# Patient Record
Sex: Male | Born: 1935
Health system: Southern US, Community
[De-identification: ages and names within clinical notes are randomized; demographics above are authoritative.]

## PROBLEM LIST (undated history)

## (undated) DIAGNOSIS — I10 Essential (primary) hypertension: Secondary | ICD-10-CM

## (undated) DIAGNOSIS — M869 Osteomyelitis, unspecified: Secondary | ICD-10-CM

## (undated) DIAGNOSIS — N179 Acute kidney failure, unspecified: Secondary | ICD-10-CM

## (undated) DIAGNOSIS — E119 Type 2 diabetes mellitus without complications: Secondary | ICD-10-CM

## (undated) DIAGNOSIS — E785 Hyperlipidemia, unspecified: Secondary | ICD-10-CM

## (undated) HISTORY — DX: Hyperlipidemia, unspecified: E78.5

## (undated) HISTORY — PX: HERNIA REPAIR: SHX51

## (undated) HISTORY — PX: TOE AMPUTATION: SHX809

## (undated) HISTORY — PX: APPENDECTOMY: SHX54

---
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
## 1959-04-07 HISTORY — PX: BRAIN SURGERY: SHX531

## 2008-05-16 ENCOUNTER — Ambulatory Visit: Payer: Self-pay | Admitting: Ophthalmology

## 2008-05-29 ENCOUNTER — Ambulatory Visit: Payer: Self-pay | Admitting: Ophthalmology

## 2008-07-02 LAB — HM HEPATITIS C SCREENING LAB: HM HEPATITIS C SCREENING: NEGATIVE

## 2010-06-02 ENCOUNTER — Ambulatory Visit: Payer: Self-pay | Admitting: Ophthalmology

## 2011-07-07 ENCOUNTER — Other Ambulatory Visit: Payer: Self-pay | Admitting: Ophthalmology

## 2011-07-07 DIAGNOSIS — H47019 Ischemic optic neuropathy, unspecified eye: Secondary | ICD-10-CM

## 2011-07-09 ENCOUNTER — Ambulatory Visit
Admission: RE | Admit: 2011-07-09 | Discharge: 2011-07-09 | Disposition: A | Discharge status: Home / Self Care | Payer: PRIVATE HEALTH INSURANCE | Source: Ambulatory Visit | Attending: Ophthalmology | Admitting: Ophthalmology

## 2011-07-09 DIAGNOSIS — H47019 Ischemic optic neuropathy, unspecified eye: Secondary | ICD-10-CM

## 2011-07-09 MED ORDER — GADOBENATE DIMEGLUMINE 529 MG/ML IV SOLN
20.0000 mL | Freq: Once | INTRAVENOUS | Status: AC | PRN
  Administered 2011-07-09: 20 mL via INTRAVENOUS

## 2012-03-09 ENCOUNTER — Other Ambulatory Visit: Payer: Self-pay | Admitting: Neurosurgery

## 2012-03-09 DIAGNOSIS — D329 Benign neoplasm of meninges, unspecified: Secondary | ICD-10-CM

## 2012-03-21 ENCOUNTER — Ambulatory Visit
Admission: RE | Admit: 2012-03-21 | Discharge: 2012-03-21 | Disposition: A | Discharge status: Home / Self Care | Payer: PRIVATE HEALTH INSURANCE | Source: Ambulatory Visit | Attending: Neurosurgery | Admitting: Neurosurgery

## 2012-03-21 DIAGNOSIS — D329 Benign neoplasm of meninges, unspecified: Secondary | ICD-10-CM

## 2013-11-21 ENCOUNTER — Encounter: Payer: Self-pay | Admitting: Podiatry

## 2013-11-21 ENCOUNTER — Ambulatory Visit (INDEPENDENT_AMBULATORY_CARE_PROVIDER_SITE_OTHER): Payer: Medicare Other | Admitting: Podiatry

## 2013-11-21 VITALS — BP 188/102 | HR 90 | Resp 16 | Ht 71.0 in | Wt 209.0 lb

## 2013-11-21 DIAGNOSIS — M79609 Pain in unspecified limb: Secondary | ICD-10-CM

## 2013-11-21 DIAGNOSIS — M79673 Pain in unspecified foot: Secondary | ICD-10-CM

## 2013-11-21 DIAGNOSIS — B351 Tinea unguium: Secondary | ICD-10-CM

## 2013-11-21 NOTE — Progress Notes (Signed)
   Subjective:    Patient ID: Lucas Jackson, male    DOB: 10/25/1927, 80 y.o.   MRN: [id number]  HPI Comments: i need my toenails trimmed. They do not hurt. They are not getting any worse. My nails have been this way for yrs. i trim my toenails.      Review of Systems     Objective:   Physical Exam        Assessment & Plan:

## 2013-11-22 NOTE — Progress Notes (Signed)
Subjective:     Patient ID: Lucas Jackson, male   DOB: [birth year], 80 y.o.   MRN: [id number]  HPI patient presents stating I cannot cut my nails anymore they're thick and they can become sore at times and the corner if they get too long   Review of Systems  All other systems reviewed and are negative.      Objective:   Physical Exam  Nursing note and vitals reviewed. Constitutional: He is oriented to person, place, and time.  Cardiovascular: Intact distal pulses.   Musculoskeletal: Normal range of motion.  Neurological: He is oriented to person, place, and time.  Skin: Skin is warm and dry.   neurovascular status found to be intact with muscle strength adequate and range of motion subtalar midtarsal joint within normal limits. Patient is found to have diminished MPJ motion and nail disease with thickness of the underlying nailbed and pain with pressure upon corner evaluation and is noted to have mild structural digital deformities     Assessment:     Nail disease with mycosis and digital deformities of both feet    Plan:     H&P performed and debridement accomplished with advice on wider shoes and soft leather-type materials

## 2014-02-20 ENCOUNTER — Ambulatory Visit: Payer: Medicare Other

## 2014-06-28 ENCOUNTER — Ambulatory Visit (INDEPENDENT_AMBULATORY_CARE_PROVIDER_SITE_OTHER): Payer: Medicare Other

## 2014-06-28 ENCOUNTER — Ambulatory Visit (INDEPENDENT_AMBULATORY_CARE_PROVIDER_SITE_OTHER): Payer: Medicare Other | Admitting: Podiatrist

## 2014-06-28 ENCOUNTER — Encounter: Payer: Self-pay | Admitting: Podiatrist

## 2014-06-28 VITALS — BP 162/78 | HR 73

## 2014-06-28 DIAGNOSIS — L97514 Non-pressure chronic ulcer of other part of right foot with necrosis of bone: Secondary | ICD-10-CM

## 2014-06-28 DIAGNOSIS — M79609 Pain in unspecified limb: Principal | ICD-10-CM

## 2014-06-28 DIAGNOSIS — B351 Tinea unguium: Secondary | ICD-10-CM | POA: Diagnosis not present

## 2014-06-28 MED ORDER — HYDROCODONE-ACETAMINOPHEN 5-325 MG PO TABS: 1.0000 | ORAL_TABLET | ORAL | Status: DC | PRN

## 2014-06-28 MED ORDER — CIPROFLOXACIN HCL 500 MG PO TABS: 500.0000 mg | ORAL_TABLET | Freq: Two times a day (BID) | ORAL | Status: DC

## 2014-06-28 MED ORDER — SILVER SULFADIAZINE 1 % EX CREA: 1.0000 "application " | TOPICAL_CREAM | Freq: Every day | CUTANEOUS | Status: DC

## 2014-06-28 MED ORDER — AMOXICILLIN-POT CLAVULANATE 875-125 MG PO TABS: 1.0000 | ORAL_TABLET | Freq: Two times a day (BID) | ORAL | Status: DC

## 2014-06-28 NOTE — Patient Instructions (Addendum)
  Instructions for Wound Care  The most important step to healing a foot wound is to reduce the pressure on your foot - it is extremely important to stay off your foot as much as possible and wear the shoe/boot as instructed.  Cleanse your foot with saline wash or warm soapy water (dial antibacterial soap or similar).  Blot dry.  Apply prescribed medication to your wound and cover with gauze and a bandage.  May hold bandage in place with Coban (self sticky wrap), Ace bandage or tape.  You may find dressing supplies at your local [employer], [employer], drug store or medical supply store.  Your prescribed topical medication is :  Silvadene Cream (twice daily)    If you notice any foul odor, increase in pain, pus, increased swelling, red streaks or generalized redness occurring in your foot or leg-Call our office immediately to be seen.  This may be a sign of a limb or life threatening infection that will need prompt attention.  Trudie Buckler, [place]  [phone] [place] [facility]

## 2014-06-28 NOTE — Progress Notes (Signed)
   Subjective:    Patient ID: Lucas Jackson, male    DOB: 1927-11-17, 81 y.o.   MRN: [id number]  HPI  Right great toenail, pain and swelling , nail may be coming off. Patient is diabetic, relates no trauma or injury to the toe.  States the right great toe started to swell recently.   Review of Systems  All other systems reviewed and are negative.      Objective:   Physical Exam Vascular status intact with pedal pulse palpable dp at 1/4 and pt pulse at 1/4 right.  Left foot has pulses intact.  Peripheral neuropathy is noted bilateral.  No sensation present to the plantar tip of the right great toe.  Right hallux has ulceration plantar distal tip of the hallux. + probe to bone.  No pus or drainage expressed.  Necrotic, hyperkeratotic tissue peri ulceration is noted.  No malodor noted.  Right hallux is mildly loose and is debrided back.  Moderate swelling to the right foot is noted.   xrays taken of the right hallux-  Reveal osteolysis at the distal tuft of the hallux.  + for osteomyelitis.     Assessment & Plan:  Ulceration with osteomyelitis tip of right hallux  Plan:  We are going to try a course of antibiotic to see if it will improve the foot and toe-- discussed that osteomyelitis often requires debridement of infected bone.  Will start him on cipro and augmentin and he will be seen back next week.  Also wrote him for some hydrocodone for pain.  He is a [place] patient and requests to be seen in the [place] office in the future.  An appointment for next week has been set up for him. If any increased redness, swelling, pain or drainage are to occur he is instructed to call or go to the er.

## 2014-07-03 ENCOUNTER — Inpatient Hospital Stay
Admit: 2014-07-03 | Disposition: A | Discharge status: Skilled Nursing Facility | Payer: Self-pay | Attending: Internal Medicine | Admitting: Internal Medicine

## 2014-07-03 ENCOUNTER — Ambulatory Visit (INDEPENDENT_AMBULATORY_CARE_PROVIDER_SITE_OTHER): Payer: Medicare Other | Admitting: Podiatry

## 2014-07-03 VITALS — Temp 99.1°F

## 2014-07-03 DIAGNOSIS — L03119 Cellulitis of unspecified part of limb: Secondary | ICD-10-CM

## 2014-07-03 DIAGNOSIS — L97514 Non-pressure chronic ulcer of other part of right foot with necrosis of bone: Secondary | ICD-10-CM | POA: Diagnosis not present

## 2014-07-03 LAB — CBC WITH DIFFERENTIAL/PLATELET
BASOS ABS: 0.1 10*3/uL (ref 0.0–0.1)
BASOS PCT: 0.8 %
EOS ABS: 0.1 10*3/uL (ref 0.0–0.7)
Eosinophil %: 1 %
HCT: 35 % — AB (ref 40.0–52.0)
HGB: 11.1 g/dL — AB (ref 13.0–18.0)
LYMPHS PCT: 18.3 %
Lymphocyte #: 1.3 10*3/uL (ref 1.0–3.6)
MCH: 23.4 pg — ABNORMAL LOW (ref 26.0–34.0)
MCHC: 31.7 g/dL — ABNORMAL LOW (ref 32.0–36.0)
MCV: 74 fL — ABNORMAL LOW (ref 80–100)
MONOS PCT: 6 %
Monocyte #: 0.4 x10 3/mm (ref 0.2–1.0)
NEUTROS ABS: 5.3 10*3/uL (ref 1.4–6.5)
Neutrophil %: 73.9 %
Platelet: 349 10*3/uL (ref 150–440)
RBC: 4.74 10*6/uL (ref 4.40–5.90)
RDW: 14.6 % — ABNORMAL HIGH (ref 11.5–14.5)
WBC: 7.2 10*3/uL (ref 3.8–10.6)

## 2014-07-03 LAB — COMPREHENSIVE METABOLIC PANEL
ALK PHOS: 66 U/L
ALT: 24 U/L
Albumin: 3.3 g/dL — ABNORMAL LOW
Anion Gap: 9 (ref 7–16)
BILIRUBIN TOTAL: 0.5 mg/dL
BUN: 17 mg/dL
CALCIUM: 8.9 mg/dL
Chloride: 101 mmol/L
Co2: 27 mmol/L
Creatinine: 1.11 mg/dL
EGFR (African American): >60
EGFR (Non-African Amer.): >60
Glucose: 101 mg/dL — ABNORMAL HIGH
POTASSIUM: 4.2 mmol/L
SGOT(AST): 37 U/L
Sodium: 137 mmol/L
Total Protein: 7.5 g/dL

## 2014-07-03 LAB — LACTIC ACID, PLASMA: Lactic Acid, Venous: 1.2 mmol/L

## 2014-07-03 NOTE — Patient Instructions (Signed)
Due to the worsening infection in your right big toe, I recommended you go directly to the emergency room.

## 2014-07-04 LAB — COMPREHENSIVE METABOLIC PANEL
ALT: 20 U/L
ANION GAP: 7 (ref 7–16)
Albumin: 2.7 g/dL — ABNORMAL LOW
Alkaline Phosphatase: 55 U/L
BUN: 17 mg/dL
Bilirubin,Total: 0.5 mg/dL
CALCIUM: 8.4 mg/dL — AB
CHLORIDE: 102 mmol/L
Co2: 28 mmol/L
Creatinine: 1.1 mg/dL
EGFR (Non-African Amer.): >60
Glucose: 124 mg/dL — ABNORMAL HIGH
Potassium: 3.3 mmol/L — ABNORMAL LOW
SGOT(AST): 28 U/L
Sodium: 137 mmol/L
Total Protein: 6.4 g/dL — ABNORMAL LOW

## 2014-07-04 LAB — CBC WITH DIFFERENTIAL/PLATELET
Basophil #: 0 10*3/uL (ref 0.0–0.1)
Basophil %: 0.4 %
EOS ABS: 0.1 10*3/uL (ref 0.0–0.7)
Eosinophil %: 1.6 %
HCT: 31 % — ABNORMAL LOW (ref 40.0–52.0)
HGB: 10 g/dL — ABNORMAL LOW (ref 13.0–18.0)
LYMPHS PCT: 27.6 %
Lymphocyte #: 1.3 10*3/uL (ref 1.0–3.6)
MCH: 23.8 pg — AB (ref 26.0–34.0)
MCHC: 32.2 g/dL (ref 32.0–36.0)
MCV: 74 fL — AB (ref 80–100)
MONOS PCT: 8.5 %
Monocyte #: 0.4 x10 3/mm (ref 0.2–1.0)
NEUTROS PCT: 61.9 %
Neutrophil #: 3 10*3/uL (ref 1.4–6.5)
Platelet: 280 10*3/uL (ref 150–440)
RBC: 4.19 10*6/uL — AB (ref 4.40–5.90)
RDW: 14.5 % (ref 11.5–14.5)
WBC: 4.9 10*3/uL (ref 3.8–10.6)

## 2014-07-05 LAB — CBC WITH DIFFERENTIAL/PLATELET
BASOS PCT: 0.4 %
Basophil #: 0 10*3/uL (ref 0.0–0.1)
EOS ABS: 0.1 10*3/uL (ref 0.0–0.7)
Eosinophil %: 2.6 %
HCT: 33 % — AB (ref 40.0–52.0)
HGB: 10.5 g/dL — ABNORMAL LOW (ref 13.0–18.0)
LYMPHS PCT: 27 %
Lymphocyte #: 1.4 10*3/uL (ref 1.0–3.6)
MCH: 23.6 pg — ABNORMAL LOW (ref 26.0–34.0)
MCHC: 31.9 g/dL — AB (ref 32.0–36.0)
MCV: 74 fL — AB (ref 80–100)
MONOS PCT: 6.1 %
Monocyte #: 0.3 x10 3/mm (ref 0.2–1.0)
Neutrophil #: 3.3 10*3/uL (ref 1.4–6.5)
Neutrophil %: 63.9 %
Platelet: 314 10*3/uL (ref 150–440)
RBC: 4.47 10*6/uL (ref 4.40–5.90)
RDW: 14.4 % (ref 11.5–14.5)
WBC: 5.2 10*3/uL (ref 3.8–10.6)

## 2014-07-05 LAB — BASIC METABOLIC PANEL
ANION GAP: 5 — AB (ref 7–16)
BUN: 18 mg/dL
CHLORIDE: 106 mmol/L
Calcium, Total: 8.5 mg/dL — ABNORMAL LOW
Co2: 27 mmol/L
Creatinine: 1.17 mg/dL
EGFR (African American): >60
EGFR (Non-African Amer.): 57 — ABNORMAL LOW
Glucose: 105 mg/dL — ABNORMAL HIGH
POTASSIUM: 3.5 mmol/L
SODIUM: 138 mmol/L

## 2014-07-05 NOTE — Progress Notes (Signed)
Patient ID: Lucas Jackson, male   DOB: Oct 27, 1927, 81 y.o.   MRN: [id number]  Subjective: 81 year old male presents the office today for an emergency appointment for right hallux infection. He previously states that that toenail surgery the fall off was causing irritation. He last saw Dr. Rolley Sims and which antibiotics were prescribed due to infection. The bandages applied at that time and he states that he the bandage had not been removed until today. He followed up at his primary care physician who had concerns of infection and sent him to the office. He denies any systemic complaints as fevers, chills, nausea, vomiting. Does state that overall he does not have an appetite does not feel well. No other complaints at this time.  Objective: AAO 3, NAD DP/PT pulses mild decreased, CRT delayed to right hallux Protective sensation appears to be decreased with Derrel Nip monofilament, Achilles tendon reflex intact. There is significant edema to the right hallux and there is epidermalysis to the digit. There is hyperpigmentation of the hallux. There is a plantar ulceration of the distal aspect of the toe with a granular wound. There is purulence expressed from the wound and from the dorsal aspect of the hallux. There is erythema to the digit and there is edema to the right foot and leg with an increase in warmth to the foot and leg. There is no specific areas of fluctuance or crepitus identified. No other open lesions identified. No calf pain with compression.  Assessment:  81 year old male right hallux worsening infection, osteomyelitis  Plan: -Previous x-rays were reviewed with the patient. -At this time given the appearance of the wound I strongly recommend the patient go to the emergency department for evaluation and likely admission. He is been on by mouth antibiotics now for couple of days and appears at the wound is worsening. Patient most likely would need hallux amputation. Patient had  concerns about going to the emergency department however we had a long conversation in regards to this and after the conversation he verbalized understanding. He was accompanied today by a family member who also understood and states that she will take to [facility].  *After the patient arrived to the emergency department recently call from an emergency room provider in regards the patient. He states that the patient most likely did not meet admission criteria although I strongly recommended admission for this patient. They will obtain x-rays and go from there. Discussed with him that if the patient is not limited to have him follow-up with [place].

## 2014-07-06 ENCOUNTER — Ambulatory Visit: Payer: Medicaid Other | Admitting: Podiatry

## 2014-07-06 LAB — VANCOMYCIN, TROUGH: VANCOMYCIN, TROUGH: 15 ug/mL

## 2014-07-07 LAB — CBC WITH DIFFERENTIAL/PLATELET
Basophil #: 0 10*3/uL (ref 0.0–0.1)
Basophil %: 0.3 %
EOS ABS: 0.1 10*3/uL (ref 0.0–0.7)
EOS PCT: 2.4 %
HCT: 31.2 % — ABNORMAL LOW (ref 40.0–52.0)
HGB: 10.2 g/dL — ABNORMAL LOW (ref 13.0–18.0)
Lymphocyte #: 1.5 10*3/uL (ref 1.0–3.6)
Lymphocyte %: 32.3 %
MCH: 24 pg — ABNORMAL LOW (ref 26.0–34.0)
MCHC: 32.6 g/dL (ref 32.0–36.0)
MCV: 74 fL — ABNORMAL LOW (ref 80–100)
MONO ABS: 0.4 x10 3/mm (ref 0.2–1.0)
Monocyte %: 7.6 %
NEUTROS PCT: 57.4 %
Neutrophil #: 2.7 10*3/uL (ref 1.4–6.5)
Platelet: 286 10*3/uL (ref 150–440)
RBC: 4.24 10*6/uL — AB (ref 4.40–5.90)
RDW: 14.7 % — ABNORMAL HIGH (ref 11.5–14.5)
WBC: 4.7 10*3/uL (ref 3.8–10.6)

## 2014-07-07 LAB — BASIC METABOLIC PANEL
Anion Gap: 6 — ABNORMAL LOW (ref 7–16)
BUN: 19 mg/dL
CALCIUM: 7.8 mg/dL — AB
CO2: 27 mmol/L
Chloride: 105 mmol/L
Creatinine: 1.3 mg/dL — ABNORMAL HIGH
GFR CALC AF AMER: 58 — AB
GFR CALC NON AF AMER: 50 — AB
Glucose: 98 mg/dL
Potassium: 3.4 mmol/L — ABNORMAL LOW
Sodium: 138 mmol/L

## 2014-07-08 LAB — VANCOMYCIN, TROUGH: Vancomycin, Trough: 16 ug/mL

## 2014-07-08 LAB — CREATININE, SERUM
Creatinine: 1.28 mg/dL — ABNORMAL HIGH
GFR CALC AF AMER: 59 — AB
GFR CALC NON AF AMER: 51 — AB

## 2014-07-10 LAB — WOUND CULTURE

## 2014-07-30 LAB — SURGICAL PATHOLOGY

## 2014-08-05 NOTE — Consult Note (Signed)
PATIENT NAME:  ABDULHADI, STOPA MR#:  [id number] DATE OF BIRTH:  08/11/1927  DATE OF CONSULTATION:  07/04/2014  REFERRING PHYSICIAN:   CONSULTING PHYSICIAN:  Sharlotte Alamo, DPM  REASON FOR CONSULTATION: Mr. Melander is an 81 year old male with a history of diabetes and a great toe ulcer on his right foot. He has been followed by [facility] outpatient and was placed on antibiotics both Cipro and amoxicillin about 2 weeks ago. He was seen on followup yesterday by Dr. Earleen Newport in their [facility] office who recommended going to the Emergency Department due to extensive infection developing in the great toe. I spoke with the Emergency Department doctor and recommended admission for amputation of the toe as well as a vascular consultation to evaluate his circulation as he did have nonpalpable pulses per the ER physician.   PAST MEDICAL HISTORY: Diabetes mellitus, hypertension.   PAST SURGICAL HISTORY: Exploratory laparotomy for small bowel obstruction with finding of internal hernia.   HOME MEDICATIONS:  1.  Metformin 500 mg p.o. b.i.d.  2.  Meloxicam 15 mg p.o. daily.  3.  Enalapril with hydrochlorothiazide 10/25 one tablet p.o. once daily.  4.  Ciprofloxacin 500 mg p.o. q. 12 hours. 5.  Coreg 25 mg p.o. 2 times daily.  6.  Augmentin 1 tablet p.o. q. 12 hours. 7.  Amlodipine 10 mg p.o. once daily.  8.  Hydrocodone 325/5 one tablet p.o. q. 4 hours p.r.n. pain.   ALLERGIES: No known drug allergies.   FAMILY HISTORY: Significant for diabetes and hypertension.   SOCIAL HISTORY: He lives at home alone. He does not relate any alcohol or tobacco use.   REVIEW OF SYSTEMS:   CONSTITUTIONAL: Denies any fever or chills, but does have some generalized fatigue. EYES: Denies blurry or double vision.  EARS, NOSE, AND THROAT: Denies nasal stuffiness or discharge.  RESPIRATORY: Denies cough or shortness of breath.  CARDIOVASCULAR: No chest pain or palpitations.  GASTROINTESTINAL: Denies any nausea,  vomiting, or diarrhea, or abdominal pain.  GENITOURINARY: No dysuria or incontinence.  ENDOCRINE: No polyuria, nocturia, but does have a history of diabetes.  INTEGUMENTARY: Has the sore on his right great toe. He has had some drainage.  MUSCULOSKELETAL: Denies back or joint pain.  NEUROLOGICAL: Denies any numbness or paresthesias in the feet.   PHYSICAL EXAMINATION:  VASCULAR: DP and PT pulses are thready on the left and nonpalpable on the right. Capillary fill time delayed.  NEUROLOGICAL: He does appear to have some loss of protective threshold distally in the toes on both feet. Proprioception is impaired.  INTEGUMENT: The skin is thin, dry, and atrophic with absent hair growth. Hyperpigmentations in the right foot and leg as compared to the left. There is a full thickness distal ulceration on the right hallux with some signs of cyanosis distally.  A large eschar is present covering the entire dorsal aspect of the toe with some drainage present. No acute ascending cellulitis.  MUSCULOSKELETAL: Stiff range of motion of the pedal joints. Muscle testing is deferred. Some digital contractures.   X-RAYS: Three views of the right foot reveal some apparent cortical destruction at the distal tuft of the distal phalanx on the right hallux. There does appear to be some small areas of air in the soft tissues, but these would appear to correspond with the eschar area dorsally. No evidence of any fracture or dislocation. No clear evidence of calcified blood vessels down into the foot or ankle.   LABORATORY DATA:  Admission white count was  in the normal range at 7.2. No significant left shift in neutrophils.   IMPRESSION:  1.  Diabetes with some degree of associated neuropathy.  2.  Osteomyelitis distal phalanx right hallux.  3.  Some degree of vascular compromise.   PLAN: Discussed with the patient that he will most likely need to have his great toe amputated. We will obtain a vascular consult for  evaluation prior to amputation as it does not clearly appear to be an urgent matter at this point. He will remain on his IV antibiotics and we will plan accordingly following his vascular assessment.    ____________________________ Sharlotte Alamo, DPM tc:sp D: 07/04/2014 08:14:21 ET T: 07/04/2014 12:48:14 ET JOB#: [id number]  cc: Sharlotte Alamo, DPM, <Dictator> Dulcy Fanny. Earleen Newport, [place] DPM ELECTRONICALLY SIGNED 07/18/2014 8:42

## 2014-08-05 NOTE — Discharge Summary (Signed)
PATIENT NAME:  Lucas Jackson, Lucas Jackson MR#:  [id number] DATE OF BIRTH:  08-25-1927  DATE OF ADMISSION:  07/03/2014 DATE OF DISCHARGE:  07/10/2014  DISCHARGE DIAGNOSES:  1.  Acute diabetic foot ulcer with osteomyelitis status post right great toe amputation.  2.  Malignant hypertension.  3.  Hypokalemia.   SECONDARY DIAGNOSES: Diabetes mellitus, hypertension.   CONSULTATIONS:   1.  Podiatry Sharlotte Alamo, D.P.M. 2.  Vascular surgery Leotis Pain, M.D.  2.  Infectious disease Leonel Ramsay, M.D.   PROCEDURES AND RADIOLOGY:  1.  Amputation of the right great toe by Dr. Cleda Mccreedy on April 1.  2.  Abdominal aortogram and PTA of the right posterior tibial artery by Dr. Lucky Cowboy on March 31. 3.  Right foot x-ray on the March 29 showed soft tissue swelling and air worrisome for osteomyelitis.   LABORATORY PANEL:   1.  Wound culture grew light growth of Enterococcus faecalis and rare Citrobacter koseri.  Rare Staphylococcus aureus was also seen.  2.  Repeat wound culture during amputation showed moderate growth of Staphylococcus aureus. Light growth of Enterococcus faecalis. Light growth of Staphylococcus lugdunensis.  HISTORY AND SHORT HOSPITAL COURSE:  The patient is an 81 year old male with above-mentioned medical problems who was admitted for worsening right foot ulcer worrisome for osteomyelitis confirmed by x-ray. Please see Dr. Rinaldo Ratel dictated history and physical for further details. The patient was started on broad-spectrum IV antibiotic. Podiatry consultation was obtained with Dr. Sharlotte Alamo who recommended right great toe amputation which was performed on April 1. Vascular surgery consultation was obtained with Dr. Leotis Pain who did abdominal aortogram and PTA of the right posterior tibial artery. The patient postoperatively was seen by infectious disease Dr. Adrian Prows who recommended changing the antibiotic to oral Augmentin for discharge. The patient was continuing to get physical therapy  evaluation in the hospital and dressing changes and was improving. Physical therapy recommended short-term rehabilitation placement where he is being discharged in stable condition.   VITAL SIGNS: On the date of discharge, temperature 98.4, heart rate 62 per minute, respirations 20 per minute, blood pressure 147/76. He is saturating 95% on room air.   PERTINENT PHYSICAL EXAMINATION ON THE DATE OF DISCHARGE:  CARDIOVASCULAR: S1, S2 normal. No murmurs, rubs, or gallop.  LUNGS: Clear to auscultation bilaterally. No wheezes, rales, rhonchi, or crepitation.  ABDOMEN: Soft, benign. Wound dressing was present.  It was redressed on April 4 by Dr. Cleda Mccreedy and it looked clean. Dressing was dry and intact.  NEUROLOGIC: Nonfocal examination.  OTHER:  All other physical examination remained at baseline.   DISCHARGE MEDICATIONS:   Medication Instructions  enalapril-hydrochlorothiazide 10 mg-25 mg oral tablet  1 tab(s) orally once a day   acetaminophen-hydrocodone 325 mg-5 mg oral tablet  1 tab(s) orally every 4 hours, As Needed - for Pain   silver sulfadiazine topical 1% topical cream  Apply topically to affected area once a day   amlodipine 10 mg oral tablet  1 tab(s) orally once a day   meloxicam 15 mg oral tablet  1 tab(s) orally once a day   carvedilol 25 mg oral tablet  1 tab(s) orally 2 times a day   metformin 500 mg oral tablet  1 tab(s) orally 2 times a day   amoxicillin-clavulanate 875 mg-125 mg oral tablet  1 tab(s) orally every 12 hours x 10 days    DISCHARGE DIET: 1800 ADA, low sodium, low fat, low cholesterol.   DISCHARGE ACTIVITY: As tolerated.   DISCHARGE INSTRUCTIONS  AND FOLLOWUP:   1.  The patient was instructed to follow up with Dr. Sharlotte Alamo from podiatry next Friday for suture removal.  2.  He will need follow-up with Dr. Adrian Prows from infectious disease in 2 to 3 weeks.  3.  He will get physical therapy evaluation and management while at the facility. He will need follow  up with his physician while at [facility] in 1 to 2 days.   TOTAL TIME DISCHARGING THIS PATIENT: 45 minutes.  He remains at high risk for readmission.   ____________________________ Lucina Mellow. Manuella Ghazi, MD vss:sp D: 07/10/2014 15:21:27 ET T: 07/10/2014 15:44:43 ET JOB#: [id number]  cc: Zymere Patlan S. Manuella Ghazi, MD, <Dictator> Cheral Marker. Ola Spurr, MD Algernon Huxley, MD Sharlotte Alamo, [place] MD ELECTRONICALLY SIGNED 07/11/2014 11:11

## 2014-08-05 NOTE — Op Note (Signed)
PATIENT NAME:  CRIMSON, BEER MR#:  [id number] DATE OF BIRTH:  Oct 27, 1927  DATE OF PROCEDURE:  07/06/2014  PREOPERATIVE DIAGNOSIS: Gangrene with osteomyelitis, right great toe.   POSTOPERATIVE DIAGNOSIS: Gangrene with osteomyelitis, right great toe.   PROCEDURE: Amputation right great toe at the metatarsophalangeal joint.   SURGEON: Sharlotte Alamo, DPM  ANESTHESIA: Local MAC.   HEMOSTASIS: None.   ESTIMATED BLOOD LOSS: Approximately 25 mL.   CULTURES: Bone culture distal phalanx, right great toe.   IMPLANTS: None.   INJECTABLES: None.   COMPLICATIONS: None apparent.   OPERATIVE INDICATIONS: This is an 81 year old male with recent admission for osteomyelitis of his right great toe with peripheral vascular disease and gangrenous changes to the great toe. The patient underwent successful revascularization yesterday and was deemed stable for amputation of his right great toe today.   OPERATIVE PROCEDURE: The patient was taken to the operating room and placed on the table in the supine position. Following satisfactory sedation, the right foot was anesthetized with 10 mL of 0.5% Sensorcaine plain around the first metatarsal. The foot was prepped and draped in the usual sterile fashion.   Attention was then directed to the distal aspect of the right foot where a large necrotic eschar was noted dorsally with avascular changes distally and medially. An incision was made sharply from dorsal over the first metatarsal head around the lateral aspect of the great toe at the edge of the viable and nonviable tissue and carried sharply down to the level of bone. A medial incision was then made starting in an elliptical fashion but including a lobe along the medial and plantar aspect of the hallux to use for a skin flap. This was carried sharply down to the level of the bone and periosteal and capsular dissection performed back to the level of the joint where the toe was completely disarticulated from the  metatarsophalangeal joint and removed in toto. At this point, there was noted to be good healthy remaining tissues with good active bleeding from all borders. The wound was flushed with copious amounts of sterile saline and closed using 4-0 nylon vertical mattress and simple interrupted sutures. Xeroform, 4 x 4's, fluffs, ABD and Kerlix followed by an Ace wrap applied to the right foot. The patient tolerated the procedure and anesthesia well and was transported to the PACU with vital signs stable and in good condition.   ____________________________ Sharlotte Alamo, DPM tc:sb D: 07/06/2014 14:54:21 ET T: 07/06/2014 15:02:32 ET JOB#: [id number]  cc: Sharlotte Alamo, DPM, <Dictator> Algernon Huxley, MD Mayo Ao, MD ([place]) [place] DPM ELECTRONICALLY SIGNED 07/18/2014 8:42

## 2014-08-05 NOTE — Consult Note (Signed)
CHIEF COMPLAINT and HISTORY:  Subjective/Chief Complaint right toe ulceration   History of Present Illness Patient is admitted with a non-healing ulcer of the right great toe.  This has been present for several weeks.  This was not traumatic.  He reports that after having his toenail trimmed, his ulcer developed and it has now turned black.  Only mildly painful.  He has longstanding diabetes.  He is a reasonably poor historian.  He has no rest pain or obvious claudication, but not clear how much he walks.  He has no left leg symptoms.  He reports the wound getting worse over the past several days, and outpatient Podiatrist recommended yesterday that he come to be admitted and started on IV Abx.  He denies previous history of vascular interventions or bypass to his knowledge.   PAST MEDICAL/SURGICAL HISTORY:  Past Medical History:   DM:   ALLERGIES:  Allergies:  No Known Allergies:   HOME MEDICATIONS:  Home Medications: Medication Instructions Status  enalapril-hydrochlorothiazide 10 mg-25 mg oral tablet 1 tab(s) orally once a day Active  acetaminophen-HYDROcodone 325 mg-5 mg oral tablet 1 tab(s) orally every 4 hours, As Needed - for Pain Active  silver sulfADIAZINE topical 1% topical cream Apply topically to affected area once a day Active  ciprofloxacin 500 mg oral tablet 1 tab(s) orally every 12 hours Active  amoxicillin-clavulanate 875 mg-125 mg oral tablet 1 tab(s) orally every 12 hours Active  amLODIPine 10 mg oral tablet 1 tab(s) orally once a day Active  meloxicam 15 mg oral tablet 1 tab(s) orally once a day Active  carvedilol 25 mg oral tablet 1 tab(s) orally 2 times a day Active  metFORMIN 500 mg oral tablet 1 tab(s) orally 2 times a day Active   Family and Social History:  Family History Coronary Artery Disease  Hypertension  Smoking   Social History negative tobacco, negative ETOH, negative Illicit drugs   Place of Living Home   Review of Systems:  Subjective/Chief  Complaint No TIA/stroke/seizure No heat or cold intolerance No dysuria/hematuria No blurry or double vision No tinnitus or ear pain Ulcer as per HPI No suicidal ideation or psychosis No signs of bleeding or easy bruising No SOB/DOE, orthopnea, or sputum No palpitations or chest pain No N/V/D or abdominal pain No joint pain or joint swelling No fever or chills No unintentional weight loss or gain   Physical Exam:  GEN well developed, well nourished, no acute distress, appears younger than stated age   HEENT pink conjunctivae, hearing intact to voice   NECK No masses  trachea midline   RESP normal resp effort  no use of accessory muscles   CARD regular rate  no murmur  no JVD   VASCULAR ACCESS none   ABD denies tenderness  soft  normal BS   GU no superpubic tenderness   LYMPH negative neck, negative axillae   EXTR negative cyanosis/clubbing, positive edema, 2+ RLE edema, left pedal pulses 1+, non-palpable right pedal pulses   SKIN No ulcers, right great toe gangrenous with some purulent drainage.   NEURO cranial nerves intact, follows commands, motor/sensory function intact   PSYCH alert, A+O to time, place, person, not a great historian   LABS:  Laboratory Results: Hepatic:    29-Mar-16 14:26, Comprehensive Metabolic Panel  Bilirubin, Total 0.5  0.3-1.2  NOTE: New Reference Range   06/12/14  Alkaline Phosphatase 66  38-126  NOTE: New Reference Range   06/12/14  SGPT (ALT) 24  17-63  NOTE:  New Reference Range   06/12/14  SGOT (AST) 37  15-41  NOTE: New Reference Range   06/12/14  Total Protein, Serum 7.5  6.5-8.1  NOTE: New Reference Range   06/12/14  Albumin, Serum 3.3  3.5-5.0  NOTE: New reference range   06/12/14    30-Mar-16 05:21, Comprehensive Metabolic Panel  Bilirubin, Total 0.5  0.3-1.2  NOTE: New Reference Range   06/12/14  Alkaline Phosphatase 55  38-126  NOTE: New Reference Range   06/12/14  SGPT (ALT) 20  17-63  NOTE: New  Reference Range   06/12/14  SGOT (AST) 28  15-41  NOTE: New Reference Range   06/12/14  Total Protein, Serum 6.4  6.5-8.1  NOTE: New Reference Range   06/12/14  Albumin, Serum 2.7  3.5-5.0  NOTE: New reference range   06/12/14  Routine Micro:    29-Mar-16 16:52, Wound Aerobic/Anaerobic Culture  Micro Text Report   WOUND AER/ANAEROBIC CULT    COMMENT                   HOLDING FOR POSSIBLE PATHOGEN     ANTIBIOTIC  Culture Comment   HOLDING FOR POSSIBLE PATHOGEN   Result(s) reported on 04 Jul 2014 at 07:52AM.  Routine Chem:    29-Mar-16 14:26, Comprehensive Metabolic Panel  Glucose, Serum 101  65-99  NOTE: New Reference Range   06/12/14  BUN 17  6-20  NOTE: New Reference Range   06/12/14  Creatinine (comp) 1.11  0.61-1.24  NOTE: New Reference Range   06/12/14  Sodium, Serum 137  135-145  NOTE: New Reference Range   06/12/14  Potassium, Serum 4.2  3.5-5.1  NOTE: New Reference Range   06/12/14  Chloride, Serum 101  101-111  NOTE: New Reference Range   06/12/14  CO2, Serum 27  22-32  NOTE: New Reference Range   06/12/14  Calcium (Total), Serum 8.9  8.9-10.3  NOTE: New Reference Range   06/12/14  eGFR (African American) >60  eGFR (Non-African American) >60  eGFR values <40m/min/1.73 m2 may be an indication of chronic  kidney disease (CKD).  Calculated eGFR is useful in patients with stable renal function.  The eGFR calculation will not be reliable in acutely ill patients  when serum creatinine is changing rapidly. It is not useful in  patients on dialysis. The eGFR calculation may not be applicable  to patients at the low and high extremes of body sizes, pregnant  women, and vegetarians.  Result Comment   - K-HEMOLYSIS AT THIS LEVEL MAY AFFECT THE RESULT   - AST-HEMOLYSIS AT THIS LEVEL MAY AFFECT RESULT   - TBIL-HEMOLYSIS AT THIS LEVEL MAY AFFECT RESULT   Result(s) reported on 03 Jul 2014 at 03:04PM.  Anion Gap 9    29-Mar-16 14:26, Lactic Acid   Venous  Lactic Acid  Venous 1.2  0.5-2.0  NOTE: New Reference Range:   06/12/14    30-Mar-16 05:21, Comprehensive Metabolic Panel  Glucose, Serum 124  65-99  NOTE: New Reference Range   06/12/14  BUN 17  6-20  NOTE: New Reference Range   06/12/14  Creatinine (comp) 1.10  0.61-1.24  NOTE: New Reference Range   06/12/14  Sodium, Serum 137  135-145  NOTE: New Reference Range   06/12/14  Potassium, Serum 3.3  3.5-5.1  NOTE: New Reference Range   06/12/14  Chloride, Serum 102  101-111  NOTE: New Reference Range   06/12/14  CO2, Serum 28  22-32  NOTE: New Reference Range  06/12/14  Calcium (Total), Serum 8.4  8.9-10.3  NOTE: New Reference Range   06/12/14  eGFR (African American) >60  eGFR (Non-African American) >60  eGFR values <73m/min/1.73 m2 may be an indication of chronic  kidney disease (CKD).  Calculated eGFR is useful in patients with stable renal function.  The eGFR calculation will not be reliable in acutely ill patients  when serum creatinine is changing rapidly. It is not useful in  patients on dialysis. The eGFR calculation may not be applicable  to patients at the low and high extremes of body sizes, pregnant  women, and vegetarians.  Anion Gap 7  Routine Hem:    29-Mar-16 14:26, CBC Profile  WBC (CBC) 7.2  RBC (CBC) 4.74  Hemoglobin (CBC) 11.1  Hematocrit (CBC) 35.0  Platelet Count (CBC) 349  MCV 74  MCH 23.4  MCHC 31.7  RDW 14.6  Neutrophil % 73.9  Lymphocyte % 18.3  Monocyte % 6.0  Eosinophil % 1.0  Basophil % 0.8  Neutrophil # 5.3  Lymphocyte # 1.3  Monocyte # 0.4  Eosinophil # 0.1  Basophil # 0.1  Result(s) reported on 03 Jul 2014 at 02:47PM.    30-Mar-16 05:21, CBC Profile  WBC (CBC) 4.9  RBC (CBC) 4.19  Hemoglobin (CBC) 10.0  Hematocrit (CBC) 31.0  Platelet Count (CBC) 280  MCV 74  MCH 23.8  MCHC 32.2  RDW 14.5  Neutrophil % 61.9  Lymphocyte % 27.6  Monocyte % 8.5  Eosinophil % 1.6  Basophil % 0.4  Neutrophil # 3.0   Lymphocyte # 1.3  Monocyte # 0.4  Eosinophil # 0.1  Basophil # 0.0  Result(s) reported on 04 Jul 2014 at 06:33AM.   RADIOLOGY:  Radiology Results: XRay:    29-Mar-16 16:58, Foot Right Complete  Foot Right Complete  REASON FOR EXAM:    extensive wound to right great toe, eval for   osteomyelitis  COMMENTS:       PROCEDURE: DXR - DXR FOOT RT COMPLETE W/OBLIQUES  - Jul 03 2014  4:58PM     CLINICAL DATA:  Right great toe wound, evaluate for osteomyelitis    EXAM:  RIGHT FOOT COMPLETE - 3+ VIEW    COMPARISON:  None.    FINDINGS:  Three views of the right foot submitted. There is soft tissue  swelling and small amount of soft tissue air probable wound noted  great toe. There is cortical irregularity and mild fragmentation at  the tip of distal phalanx suspicious for osteomyelitis. Degenerative  changes interphalangeal joint. No acute fracture or subluxation.     IMPRESSION:  There is soft tissue swelling and small amount of soft tissue air  probable wound noted great toe. There is cortical irregularity and  mild fragmentation at the tip of distal phalanx great toe suspicious  for osteomyelitis. Degenerative changes interphalangeal joint. No  acute fracture or subluxation.      Electronically Signed    BXE:NMMHW Pop M.D.    On: 07/03/2014 17:03         Verified By: LEphraim Hamburger M.D.,  LabUnknown:  PACS Image   ASSESSMENT AND PLAN:  Assessment/Admission Diagnosis gangrene right great toe poorly palpable pedal pulses DM, management per primary service.   Plan this is a serious an limb threatening situation with a high risk of limb loss.  His pedal pulses are not readily palpable, and this is likely due to PAD with his diabetes and other risk factors.  He has non-salvageable right great toe,  and his right great toe will almost certainly need amputation.  Podiatry seeing as well.  He needs an assessment of his arterial status and would recommend an angiogram and plan to  perform this tomorrow.  Risks and benefits of angiogram were discussed.  Even with revascularization, limb loss is possible.  Agree with ABx and optimized sugar control as well.     level 4 consult   Electronic Signatures: Algernon Huxley (MD)  (Signed 30-Mar-16 15:44)  Authored: Chief Complaint and History, PAST MEDICAL/SURGICAL HISTORY, ALLERGIES, HOME MEDICATIONS, Family and Social History, Review of Systems, Physical Exam, LABS, RADIOLOGY, Assessment and Plan   Last Updated: 30-Mar-16 15:44 by Algernon Huxley (MD)

## 2014-08-05 NOTE — Op Note (Signed)
PATIENT NAME:  Lucas Jackson, Lucas Jackson MR#:  [id number] DATE OF BIRTH:  [birth year]  DATE OF PROCEDURE:  07/05/2014  PREOPERATIVE DIAGNOSES:  1.  Peripheral arterial disease with gangrene right great toe.  2.  Diabetes mellitus.  3.  Ulceration gangrene right great toe.   POSTOPERATIVE DIAGNOSES:  1.  Peripheral arterial disease with gangrene right great toe.  2.  Diabetes mellitus.  3.  Ulceration gangrene right great toe.   PROCEDURES:  1.  Ultrasound guidance for vascular access to left femoral artery.  2.  Catheter placement to the right posterior tibial artery and right anterior tibial artery from left femoral approach.  3.  Aortogram and selective right lower extremity angiogram.  4.  Percutaneous transluminal angioplasty of the entire right posterior tibial artery into the foot for long segment occlusion with 3 mm diameter angioplasty balloon.  5.  StarClose closure device, left femoral artery.   SURGEON: Algernon Huxley, MD.   ANESTHESIA: Local with moderate conscious sedation.   ESTIMATED BLOOD LOSS: Minimal.   FLUOROSCOPY TIME:  5 minutes.   INDICATION FOR PROCEDURE: This is an 81 year old gentleman who was admitted to the hospital earlier this week with a gangrenous right great toe. He had nonpalpable pedal pulses. For this reason we are bringing him down for angiography for further evaluation and potential treatment in hopes of revascularization to improve his wound healing abilities. He will likely need great toe amputation, but to try to salvage his limb we are hoping to improve his perfusion. Risks and benefits were discussed. Informed consent was obtained.   DESCRIPTION OF PROCEDURE: The patient was brought to the vascular suite. Groins were shaved and prepped and a sterile surgical field was created. The left femoral head was localized with fluoroscopy. The left femoral artery was accessed without difficulty under direct ultrasound guidance and a permanent image was recorded. A 5  French sheath was then placed. Pigtail catheter was placed in the aorta at the L1-L2 level and an AP aortogram was performed. This showed normal flow in the visceral vessels and the renal mesenterics and normal aorta and iliac segments. I then crossed the aortic bifurcation with the pigtail catheter and a J-wire and advanced to the right femoral head. Selective right lower extremity angiogram was then performed. This demonstrated a normal common femoral artery, profunda femoris artery, superficial femoral artery, popliteal artery. He then had a normal tibial trifurcation. His posterior tibial artery occluded a few centimeters beyond its origin, but reconstituted just above the ankle. The peroneal artery was continuous and helped reconstitute into the foot with its normal termination above the ankle. The anterior tibial artery was occluded about 5 cm beyond its origin. It was difficult to see a distal target.  I then heparinized the patient, placed a 6 [place] Ansell sheath over a Terumo Advantage wire, and then easily cross the posterior tibial artery occlusion with the Terumo Advantage wire and a 135 angled catheter, confirming intraluminal flow at the ankle and the posterior tibial artery. We exchanged for an 0.018 wire. A 3 mm diameter angioplasty balloon was inflated twice, this was a long angioplasty balloon inflated from just below the ankle in the posterior tibial artery, up through the tibioperoneal trunk into the distal popliteal artery. Multiple areas of narrowing were seen which resolved with angioplasty and completion angiogram now showed the vessel to be patent without significant residual stenosis into the foot, with markedly improved flow. At this point we had given 2 vessels, I probed the anterior  tibial artery with a Kumpe catheter and an 0.018 wire. Selective imaging was performed of the anterior tibial artery which showed a chronic total occlusion without a good reconstitution distally and I did  not feel that we were likely to gain success after the wire did not cross easily in the anterior tibial artery with multiple passes, without a distal target this seemed to be a futile endeavor. He now had 2 vessels into the foot with the posterior tibial artery now being continuous with good flow. I elected to terminate the procedure. The sheath was pulled back to the ipsilateral external iliac artery and oblique arteriogram was performed. StarClose closure device was deployed in the usual fashion with excellent hemostatic result. The patient tolerated the procedure well and was taken to the recovery room in stable condition.    ____________________________ Algernon Huxley, MD jsd:[id number] D: 07/05/2014 17:14:03 ET T: 07/05/2014 17:42:50 ET JOB#: [id number]  cc: Algernon Huxley, MD, <Dictator> Sharlotte Alamo, DPM Algernon Huxley MD ELECTRONICALLY SIGNED 07/09/2014 15:52

## 2014-08-05 NOTE — Consult Note (Signed)
PATIENT NAME:  Lucas Jackson, LAVALLEY MR#:  [id number] DATE OF BIRTH:  1927-04-25  DATE OF CONSULTATION:  07/09/2014  REFERRING PHYSICIAN:   CONSULTING PHYSICIAN:  Cheral Marker. Ola Spurr, MD  REQUESTING PHYSICIAN: Dr. Manuella Ghazi.  REASON FOR CONSULTATION: Diabetic foot infection and osteomyelitis.   HISTORY OF PRESENT ILLNESS: This is a pleasant 81 year old gentleman admitted March 29 with worsening of a right foot ulcer. He has a history of diabetes. He said he was following with podiatry in [id number] and had debridement of the toe nail and ulcer at that site. He was, however, unable to care for it at home, and the patient  presented to [facility], where he was then sent to podiatry. He was admitted from podiatry due to worsening foot infection. Since admission, the patient has had surgery on April 1 with Dr. Cleda Mccreedy for amputation of the right great toe. He has also undergone revascularization by Dr. Lucky Cowboy on March 31, with angioplasty. Cultures were mixed. He has been treated with IV antibiotics. We are consulted for further antibiotic management.   PAST MEDICAL HISTORY: 1. Diabetes.  2. Peripheral vascular disease.  3. Hypertension.   PAST SURGICAL HISTORY:  1. Exploratory laparotomy for small bowel obstruction with internal hernia in the past.  2. Revascularization, as above.  3. Toe amputation, as above.   SOCIAL HISTORY: The patient lives alone. Does not smoke or drink.   FAMILY HISTORY: Noncontributory.   ALLERGIES: No known drug allergies.   REVIEW OF SYSTEMS: Eleven systems reviewed and negative, except as per HPI.   ANTIBIOTICS SINCE ADMISSION: Include vancomycin and Zosyn.   PHYSICAL EXAMINATION: VITAL SIGNS: Temperature 98.6, pulse 60, blood pressure 136/68, respirations 18, saturation 100%.  GENERAL: He is pleasant, interactive, sitting up in a chair.  HEENT: Pupils reactive. Sclerae anicteric. Oropharynx clear.  NECK: Supple.  HEART: Regular.  LUNGS: Clear.  ABDOMEN: Soft,  nontender, nondistended. No hepatosplenomegaly.  EXTREMITIES: His right foot is wrapped. After unwrapping, there are sutures in place at the right great toe. There is no drainage, erythema, or tenderness. He has 1 to 2+ dorsalis pedis pulse. The foot is warm and well perfused.  NEUROLOGIC: He is alert and oriented x 3. Grossly nonfocal neurological exam.   DIAGNOSTIC DATA: Wound culture, March 29, grew Staphylococcus aureus, methicillin sensitive, as well as enterococcus ampicillin sensitive. Also grew Staphylococcus lugdunensis and citrobacter. There was rare growth of citrobacter. White blood count on admission, March 29, was 7.2; repeat April 2, 4.7; hemoglobin 10.2, platelets 286,000. Renal function shows a creatinine of 1.28 on April 3.   IMAGING: X-ray of right foot, March 29, showed soft tissue swelling, a small amount of soft tissue air in the right great toe. There is cortical irregularity and mild fragmentation at the distal tip of the distal phalanx, great toe, suspicious for osteomyelitis.   IMPRESSION: An 81 year old gentleman with peripheral vascular disease, as well as diabetes, admitted with diabetic foot infection, osteomyelitis, and gangrene of the right great toe. He is now status post revascularization, as well as amputation of the toe. Cultures were mixed, growing methicillin-sensitive Staphylococcus aureus, Staphylococcus lugdunensis, enterococcus, and citrobacter. He is clinically much improved and the wound looks great.   RECOMMENDATIONS: 1. Would transition to oral Augmentin 875 twice a day for 10 more days.  2. He can follow-up with podiatry as an outpatient. If this should worsen, I can see him in follow-up, but he should heal nicely now that he has had revascularization and removal of the infected toe.  3. Thank you for the consult. I will be glad to follow with you     ____________________________ Cheral Marker. Ola Spurr, MD dpf:mw D: 07/09/2014 16:28:22 ET T: 07/09/2014  17:03:00 ET JOB#: [id number]  cc: Cheral Marker. Ola Spurr, MD, <Dictator> Tru Rana Ola Spurr MD ELECTRONICALLY SIGNED 07/10/2014 0:29

## 2014-08-05 NOTE — H&P (Signed)
PATIENT NAME:  Lucas Jackson, METTS MR#:  [id number] DATE OF BIRTH:  11-Dec-1927  DATE OF ADMISSION:  07/03/2014  PRIMARY CARE PHYSICIAN:  Dr. Delight Stare.   REFERRING EMERGENCY ROOM PHYSICIAN:  Dr. Kerman Passey.    CHIEF COMPLAINT:  Worsening of the right foot ulcer.   HISTORY OF PRESENT ILLNESS: The patient is an [name] with a past medical history of diabetes mellitus and right great toe ulcer, has been following up with podiatry as an outpatient. Recently for the past 2 weeks the infection is getting worse. He was seen by podiatry, had wound debridement done, and he was given ciprofloxacin and Augmentin. Though he has completed the antibiotic course and status post wound debridement his wound has been getting worse. The patient was recently seen by a [facility] podiatrist and there is a concern for worsening of the infection. The patient came into the ED as the situation has been getting worse. The ER physician has discussed with Dr. Earleen Newport podiatry who was on call, who has recommended to admit the admit the patient for IV antibiotics. X-ray has revealed possible osteomyelitis. ER physician has given the patient IV Zosyn and vancomycin and hospitalist team is called to admit the patient. The patient is resting comfortably. Denies any headache or shortness of breath. Denies any chest pain.  No other complaints. Blood pressure is elevated though he is compliant with his medications, his systolic blood pressure is in the 180 range.   PAST MEDICAL HISTORY: Diabetes mellitus, hypertension.   PAST SURGICAL HISTORY: Exploratory laparotomy for small bowel obstruction with finding of internal hernia.   HOME MEDICATIONS:  Silver sulfadiazine apply topically to affected area, metformin 500 mg p.o. b.i.d., meloxicam 15 mg p.o. once daily, enalapril with hydrochlorothiazide 10/25 one tablet p.o. once daily, ciprofloxacin 500 mg p.o. q. 12 hours, Coreg 25 mg p.o. 2 times a day, Augmentin 1  tablet p.o. every 12 hours, amlodipine 10 mg p.o. once daily, acetaminophen with hydrocodone 325/5 one tablet p.o. every 4 hours as needed for pain. The patient reports that he has completed the course of Augmentin and ciprofloxacin.     ALLERGIES:  No known drug allergies.     PSYCHOSOCIAL HISTORY: Lives at home, lives alone. No smoking, alcohol, or illicit drug usage.   FAMILY HISTORY: Diabetes and hypertension run in his family.   REVIEW OF SYSTEMS:   CONSTITUTIONAL:  Denies any fever. Complaining of fatigue, but denies any weakness.  EYES: Denies blurry vision, double vision.  EARS, NOSE, AND THROAT: Denies epistaxis, discharge.  RESPIRATION: Denies cough, COPD.   CARDIOVASCULAR: No chest pain or palpitations.  GASTROINTESTINAL: Denies nausea, vomiting, diarrhea, abdominal pain, or hematemesis.  GENITOURINARY: No dysuria or hematuria.  ENDOCRINE: Denies polyuria, nocturia. Has a history of diabetes mellitus and also has hypertension.  HEMATOLOGIC AND LYMPHATIC: No anemia, easy bruising, bleeding.  INTEGUMENTARY: No acne, rash, lesions other than right great toe ulcer.  MUSCULOSKELETAL: No joint pain in the neck and back. Denies any gout.  NEUROLOGIC: Denies vertigo, ataxia.  PSYCHIATRIC:  No anxiety, OCD.    PHYSICAL EXAMINATION:   VITAL SIGNS:  Temperature 98.5, pulse 72, respirations 18-20, blood pressure 196/158, repeat 164/81, pulse oximetry 97%.  GENERAL APPEARANCE: Not in any acute distress. Moderately built and nourished.  HEENT: Normocephalic, atraumatic. Pupils are equally reacting to light and accommodation. No scleral icterus. No conjunctival injection. No sinus tenderness. No postnasal drip. Moist mucous membranes.  NECK: Supple. No JVD.  No thyromegaly. Range of motion is intact.  LUNGS: Clear to auscultation bilaterally. No accessory muscle use and no anterior chest wall tenderness on palpation.  CARDIAC: S1, S2 normal. Regular rate and rhythm. No murmur.   GASTROINTESTINAL: Soft. Bowel sounds are positive in all 4 quadrants. Nontender, nondistended. No masses.  NEUROLOGIC:  Awake, alert, oriented.  Cranial nerves II through XII are grossly intact. Motor and sensory are intact. Reflexes are 2 +.  EXTREMITIES: No cyanosis. No clubbing. Right great toe looks erythematous from recent debridement, tender, erythematous, his superficial skin is with a dark discoloration, looks like necrotic tissue.    LABORATORIES AND IMAGING STUDIES:  Right foot x-ray, complete x-ray, soft tissue swelling and small amount of soft tissue air - fluid level , on  great toe there is cortical irregularity and mild fragmentation at the tip of the distal phalanx of the great toe suspicious for osteomyelitis, degenerative changes interphalangeal joint. No fracture or subluxation noticed. LFTs, total protein 7.5, albumin 3.3, bilirubin total, alkaline phosphatase, AST, and ALT are normal. WBC 7.2, hemoglobin 11.1, hematocrit 35.0, platelets are 349,000. BMP is normal. Glucose is elevated at 101.   ASSESSMENT AND PLAN: An [name], came into the Emergency Department with a chief complaint of worsening of his right great toe ulcer, will be admitted with the following assessment and plan:   1.  Acute diabetic foot ulcer with probable osteomyelitis per x-ray findings. Will admit him to medical/surgical floor. Wound cultures are ordered. Blood cultures were ordered in the ED.  We will continue IV Zosyn and vancomycin. Consult is placed to podiatry and also vascular surgery as recommended by podiatry to ER physician. We will continue wound care.  2.  Malignant hypertension. We will resume his home medication and update his medication as-needed basis. The patient is currently asymptomatic.  3.  Diabetes mellitus. We will provide him sliding scale insulin. Hold oral hypoglycemics at this point of time.  The patient has no leukocytosis as he was just given antibiotics and  finished a course of ciprofloxacin and Augmentin as an outpatient.  4.  We will provide him gastrointestinal prophylaxis and deep vein thrombosis prophylaxis.    CODE STATUS: He is full code. His son and niece are  the healthcare power of attorney. Plan of care discussed with the patient and family members at bedside.  They all verbalized understanding of the plan.   TOTAL TIME SPENT ON ADMISSION: 50 minutes.    ____________________________ Nicholes Mango, MD ag:bu D: 07/03/2014 19:51:10 ET T: 07/03/2014 20:36:13 ET JOB#: [id number]  cc: Nicholes Mango, MD, <Dictator> Nicholes Mango MD ELECTRONICALLY SIGNED 07/19/2014 12:45

## 2017-07-08 ENCOUNTER — Telehealth: Payer: Self-pay | Admitting: *Deleted

## 2017-07-08 ENCOUNTER — Encounter: Payer: Self-pay | Admitting: Emergency Medicine

## 2017-07-08 ENCOUNTER — Other Ambulatory Visit: Payer: Self-pay

## 2017-07-08 ENCOUNTER — Ambulatory Visit
Admission: EM | Admit: 2017-07-08 | Discharge: 2017-07-08 | Disposition: A | Discharge status: Home / Self Care | Payer: Medicare Other | Attending: Family Medicine | Admitting: Family Medicine

## 2017-07-08 DIAGNOSIS — Z89411 Acquired absence of right great toe: Secondary | ICD-10-CM

## 2017-07-08 DIAGNOSIS — R2243 Localized swelling, mass and lump, lower limb, bilateral: Secondary | ICD-10-CM | POA: Diagnosis not present

## 2017-07-08 DIAGNOSIS — I10 Essential (primary) hypertension: Secondary | ICD-10-CM

## 2017-07-08 DIAGNOSIS — E119 Type 2 diabetes mellitus without complications: Secondary | ICD-10-CM | POA: Diagnosis not present

## 2017-07-08 DIAGNOSIS — R600 Localized edema: Secondary | ICD-10-CM | POA: Diagnosis not present

## 2017-07-08 HISTORY — DX: Essential (primary) hypertension: I10

## 2017-07-08 HISTORY — DX: Type 2 diabetes mellitus without complications: E11.9

## 2017-07-08 LAB — HM DIABETES EYE EXAM

## 2017-07-08 MED ORDER — TRAMADOL HCL 50 MG PO TABS: 50.0000 mg | ORAL_TABLET | Freq: Three times a day (TID) | ORAL | 0 refills | Status: DC | PRN

## 2017-07-08 NOTE — Telephone Encounter (Signed)
Copied from [place] [id number]. Topic: General - Other >> Jul 08, 2017  2:04 PM Yvette Rack wrote: Reason for CRM: Peggyann Shoals [phone] pt niece is calling because the pt has went to the hospital today and saw Dr Thersa Salt the provider want pt to f/u with Caryl Bis pt never been to practice before and the niece states that Dr Lacinda Axon will get in touch with Caryl Bis about this patient

## 2017-07-08 NOTE — ED Triage Notes (Addendum)
Patient in today c/o lower leg edema x 1 year. Patient states it is difficult to walk.  Patient also states he is seeing "colored spots". He states he went to see the eye doctor ~ 1 month ago and has appointment with eye doctor this afternoon.

## 2017-07-08 NOTE — ED Provider Notes (Signed)
[facility]    CSN: [id number] Arrival date & time: 07/08/17  1140   History   Chief Complaint Chief Complaint  Patient presents with  . Leg Swelling   HPI   84 year old male presents with lower leg edema.  Patient reports ongoing lower extremity edema for the past year.  He states that he is now experiencing pain in his lower legs.  Patient also has pain in his knees.  Has difficulty ambulating as a result.  Has significant edema.  No reports of numbness or tingling that would be consistent with diabetic neuropathy.  He states that he follows with [facility].  He states that he sits for the majority of the day.  He is not very active.  He is unsure of his blood sugars.  His pain is moderate in severity.  No other associated symptoms.  No other complaints or concerns at this time.  Past Medical History:  Diagnosis Date  . Diabetes mellitus without complication ([place])   . Hypertension   PAD  Past Surgical History:  Procedure Laterality Date  . BRAIN SURGERY  1961   removed bone from inside skull that was pressing on optic nerve  . HERNIA REPAIR    . TOE AMPUTATION     Home Medications    Prior to Admission medications   Medication Sig Start Date End Date Taking? Authorizing Provider  acetaminophen (TYLENOL) 500 MG tablet Take 1,000 mg by mouth 2 (two) times daily.   Yes [provider]  aspirin EC 81 MG tablet Take 81 mg by mouth daily.   Yes [provider]  carvedilol (COREG) 25 MG tablet Take 1 tablet by mouth 2 (two) times daily. 05/01/17  Yes [provider]  enalapril-hydrochlorothiazide (VASERETIC) 10-25 MG tablet Take 1 tablet by mouth daily.   Yes [provider]  metFORMIN (GLUCOPHAGE-XR) 500 MG 24 hr tablet Take 1 tablet by mouth daily. 05/01/17  Yes [provider]  Omega-3 Fatty Acids (FISH OIL) 1000 MG CAPS Take 1 capsule by mouth 2 (two) times daily.   Yes [provider]  traMADol (ULTRAM) 50 MG  tablet Take 1 tablet (50 mg total) by mouth every 8 (eight) hours as needed. 07/08/17   Coral Spikes, DO    Family History Family History  Problem Relation Age of Onset  . Colon cancer Brother   . Stroke Brother     Social History Social History   Tobacco Use  . Smoking status: Former Smoker    Last attempt to quit: 1961    Years since quitting: 58.2  . Smokeless tobacco: Never Used  Substance Use Topics  . Alcohol use: No  . Drug use: Never     Allergies   Patient has no known allergies.   Review of Systems Review of Systems  Constitutional: Negative.   Respiratory: Negative for shortness of breath.   Cardiovascular: Positive for leg swelling.  Musculoskeletal: Positive for arthralgias.   Physical Exam Triage Vital Signs ED Triage Vitals  Enc Vitals Group     BP 07/08/17 1202 (!) 168/82     Pulse Rate 07/08/17 1202 80     Resp 07/08/17 1202 16     Temp 07/08/17 1202 98.4 F (36.9 C)     Temp Source 07/08/17 1202 Oral     SpO2 07/08/17 1202 100 %     Weight 07/08/17 1204 210 lb (95.3 kg)     Height 07/08/17 1204 6' (1.829 m)  Head Circumference --      Peak Flow --      Pain Score 07/08/17 1203 5     Pain Loc --      Pain Edu? --      Excl. in [place]? --    Updated Vital Signs BP (!) 168/82 (BP Location: Left Arm)   Pulse 80   Temp 98.4 F (36.9 C) (Oral)   Resp 16   Ht 6' (1.829 m)   Wt 210 lb (95.3 kg)   SpO2 100%   BMI 28.48 kg/m   Physical Exam  Constitutional: He appears well-developed. No distress.  Cardiovascular: Normal rate and regular rhythm.  Bilateral lower extremity edema, 2+ extending to the knee. Cannot appreciate pulses in either extremity.  His extremities are warm to the touch.  Pulmonary/Chest: Effort normal and breath sounds normal. He has no wheezes. He has no rales.  Musculoskeletal:  Status post right great toe amputation.    Neurological: He is alert.  Psychiatric: He has a normal mood and affect. His behavior is  normal.  Nursing note and vitals reviewed.  UC Treatments / Results  Labs (all labs ordered are listed, but only abnormal results are displayed) Labs Reviewed - No data to display  EKG None Radiology No results found.  Procedures Procedures (including critical care time)  Medications Ordered in UC Medications - No data to display   Initial Impression / Assessment and Plan / UC Course  I have reviewed the triage vital signs and the nursing notes.  Pertinent labs & imaging results that were available during my care of the patient were reviewed by me and considered in my medical decision making (see chart for details).    84 year old male presents with lower extremity edema.  Multifactorial: Amlodipine, venous stasis and inactivity.  Stopping amlodipine.  Advised close follow-up as he needs a blood pressure check and medication optimization.  Given PAD would benefit from statin.  Needs to establish with vascular surgery for close follow-up.  Needs aggressive control of hypertension and diabetes.  Tramadol for pain.  Final Clinical Impressions(s) / UC Diagnoses   Final diagnoses:  Leg edema    ED Discharge Orders        Ordered    traMADol (ULTRAM) 50 MG tablet  Every 8 hours PRN     07/08/17 1235     Controlled Substance Prescriptions [place] Controlled Substance Registry consulted? Not Applicable   Coral Spikes, DO 07/08/17 1338

## 2017-07-08 NOTE — Discharge Instructions (Signed)
Stop the amlodipine.   Use the pain medication as directed.  See Dr. Caryl Bis  Take care  Dr. Lacinda Axon

## 2017-08-05 ENCOUNTER — Encounter: Payer: Self-pay | Admitting: Internal Medicine

## 2017-08-05 ENCOUNTER — Ambulatory Visit (INDEPENDENT_AMBULATORY_CARE_PROVIDER_SITE_OTHER): Payer: Medicare Other | Admitting: Internal Medicine

## 2017-08-05 VITALS — BP 160/90 | HR 84 | Temp 98.1°F | Ht 72.0 in | Wt 208.0 lb

## 2017-08-05 DIAGNOSIS — M79605 Pain in left leg: Secondary | ICD-10-CM | POA: Diagnosis not present

## 2017-08-05 DIAGNOSIS — E559 Vitamin D deficiency, unspecified: Secondary | ICD-10-CM | POA: Diagnosis not present

## 2017-08-05 DIAGNOSIS — M79604 Pain in right leg: Secondary | ICD-10-CM | POA: Diagnosis not present

## 2017-08-05 DIAGNOSIS — I739 Peripheral vascular disease, unspecified: Secondary | ICD-10-CM

## 2017-08-05 DIAGNOSIS — I10 Essential (primary) hypertension: Secondary | ICD-10-CM | POA: Diagnosis not present

## 2017-08-05 DIAGNOSIS — D329 Benign neoplasm of meninges, unspecified: Secondary | ICD-10-CM | POA: Insufficient documentation

## 2017-08-05 DIAGNOSIS — R011 Cardiac murmur, unspecified: Secondary | ICD-10-CM

## 2017-08-05 DIAGNOSIS — R600 Localized edema: Secondary | ICD-10-CM | POA: Diagnosis not present

## 2017-08-05 DIAGNOSIS — Z1329 Encounter for screening for other suspected endocrine disorder: Secondary | ICD-10-CM | POA: Diagnosis not present

## 2017-08-05 DIAGNOSIS — Z1389 Encounter for screening for other disorder: Secondary | ICD-10-CM | POA: Diagnosis not present

## 2017-08-05 DIAGNOSIS — E118 Type 2 diabetes mellitus with unspecified complications: Secondary | ICD-10-CM | POA: Diagnosis not present

## 2017-08-05 DIAGNOSIS — E785 Hyperlipidemia, unspecified: Secondary | ICD-10-CM

## 2017-08-05 DIAGNOSIS — E119 Type 2 diabetes mellitus without complications: Secondary | ICD-10-CM

## 2017-08-05 DIAGNOSIS — D649 Anemia, unspecified: Secondary | ICD-10-CM

## 2017-08-05 DIAGNOSIS — M4802 Spinal stenosis, cervical region: Secondary | ICD-10-CM | POA: Diagnosis not present

## 2017-08-05 MED ORDER — TRAMADOL HCL 50 MG PO TABS: 50.0000 mg | ORAL_TABLET | Freq: Two times a day (BID) | ORAL | 0 refills | Status: DC | PRN

## 2017-08-05 MED ORDER — TELMISARTAN 80 MG PO TABS: 80.0000 mg | ORAL_TABLET | Freq: Every day | ORAL | 1 refills | Status: DC

## 2017-08-05 MED ORDER — CHLORTHALIDONE 25 MG PO TABS: 12.5000 mg | ORAL_TABLET | Freq: Every day | ORAL | 1 refills | Status: DC

## 2017-08-05 NOTE — Patient Instructions (Signed)
Please f/u in 2 weeks sooner if needed  DASH Eating Plan DASH stands for "Dietary Approaches to Stop Hypertension." The DASH eating plan is a healthy eating plan that has been shown to reduce high blood pressure (hypertension). It may also reduce your risk for type 2 diabetes, heart disease, and stroke. The DASH eating plan may also help with weight loss. What are tips for following this plan? General guidelines  Avoid eating more than 2,300 mg (milligrams) of salt (sodium) a day. If you have hypertension, you may need to reduce your sodium intake to 1,500 mg a day.  Limit alcohol intake to no more than 1 drink a day for nonpregnant women and 2 drinks a day for men. One drink equals 12 oz of beer, 5 oz of wine, or 1 oz of hard liquor.  Work with your health care provider to maintain a healthy body weight or to lose weight. Ask what an ideal weight is for you.  Get at least 30 minutes of exercise that causes your heart to beat faster (aerobic exercise) most days of the week. Activities may include walking, swimming, or biking.  Work with your health care provider or diet and nutrition specialist (dietitian) to adjust your eating plan to your individual calorie needs. Reading food labels  Check food labels for the amount of sodium per serving. Choose foods with less than 5 percent of the Daily Value of sodium. Generally, foods with less than 300 mg of sodium per serving fit into this eating plan.  To find whole grains, look for the word "whole" as the first word in the ingredient list. Shopping  Buy products labeled as "low-sodium" or "no salt added."  Buy fresh foods. Avoid canned foods and premade or frozen meals. Cooking  Avoid adding salt when cooking. Use salt-free seasonings or herbs instead of table salt or sea salt. Check with your health care provider or pharmacist before using salt substitutes.  Do not fry foods. Cook foods using healthy methods such as baking, boiling,  grilling, and broiling instead.  Cook with heart-healthy oils, such as olive, canola, soybean, or sunflower oil. Meal planning   Eat a balanced diet that includes: ? 5 or more servings of fruits and vegetables each day. At each meal, try to fill half of your plate with fruits and vegetables. ? Up to 6-8 servings of whole grains each day. ? Less than 6 oz of lean meat, poultry, or fish each day. A 3-oz serving of meat is about the same size as a deck of cards. One egg equals 1 oz. ? 2 servings of low-fat dairy each day. ? A serving of nuts, seeds, or beans 5 times each week. ? Heart-healthy fats. Healthy fats called Omega-3 fatty acids are found in foods such as flaxseeds and coldwater fish, like sardines, salmon, and mackerel.  Limit how much you eat of the following: ? Canned or prepackaged foods. ? Food that is high in trans fat, such as fried foods. ? Food that is high in saturated fat, such as fatty meat. ? Sweets, desserts, sugary drinks, and other foods with added sugar. ? Full-fat dairy products.  Do not salt foods before eating.  Try to eat at least 2 vegetarian meals each week.  Eat more home-cooked food and less restaurant, buffet, and fast food.  When eating at a restaurant, ask that your food be prepared with less salt or no salt, if possible. What foods are recommended? The items listed may not be  a complete list. Talk with your dietitian about what dietary choices are best for you. Grains Whole-grain or whole-wheat bread. Whole-grain or whole-wheat pasta. Brown rice. [place]. Bulgur. Whole-grain and low-sodium cereals. Pita bread. Low-fat, low-sodium crackers. Whole-wheat flour tortillas. Vegetables Fresh or frozen vegetables (raw, steamed, roasted, or grilled). Low-sodium or reduced-sodium tomato and vegetable juice. Low-sodium or reduced-sodium tomato sauce and tomato paste. Low-sodium or reduced-sodium canned vegetables. Fruits All fresh, dried, or frozen  fruit. Canned fruit in natural juice (without added sugar). Meat and other protein foods Skinless chicken or [place]. Ground chicken or [place]. Pork with fat trimmed off. Fish and seafood. Egg whites. Dried beans, peas, or lentils. Unsalted nuts, nut butters, and seeds. Unsalted canned beans. Lean cuts of beef with fat trimmed off. Low-sodium, lean deli meat. Dairy Low-fat (1%) or fat-free (skim) milk. Fat-free, low-fat, or reduced-fat cheeses. Nonfat, low-sodium ricotta or cottage cheese. Low-fat or nonfat yogurt. Low-fat, low-sodium cheese. Fats and oils Soft margarine without trans fats. Vegetable oil. Low-fat, reduced-fat, or light mayonnaise and salad dressings (reduced-sodium). Canola, safflower, olive, soybean, and sunflower oils. Avocado. Seasoning and other foods Herbs. Spices. Seasoning mixes without salt. Unsalted popcorn and pretzels. Fat-free sweets. What foods are not recommended? The items listed may not be a complete list. Talk with your dietitian about what dietary choices are best for you. Grains Baked goods made with fat, such as croissants, muffins, or some breads. Dry pasta or rice meal packs. Vegetables Creamed or fried vegetables. Vegetables in a cheese sauce. Regular canned vegetables (not low-sodium or reduced-sodium). Regular canned tomato sauce and paste (not low-sodium or reduced-sodium). Regular tomato and vegetable juice (not low-sodium or reduced-sodium). Angie Fava. Olives. Fruits Canned fruit in a light or heavy syrup. Fried fruit. Fruit in cream or butter sauce. Meat and other protein foods Fatty cuts of meat. Ribs. Fried meat. Berniece Salines. Sausage. Bologna and other processed lunch meats. Salami. Fatback. Hotdogs. Bratwurst. Salted nuts and seeds. Canned beans with added salt. Canned or smoked fish. Whole eggs or egg yolks. Chicken or [place] with skin. Dairy Whole or 2% milk, cream, and half-and-half. Whole or full-fat cream cheese. Whole-fat or sweetened yogurt. Full-fat  cheese. Nondairy creamers. Whipped toppings. Processed cheese and cheese spreads. Fats and oils Butter. Stick margarine. Lard. Shortening. Ghee. Bacon fat. Tropical oils, such as coconut, palm kernel, or palm oil. Seasoning and other foods Salted popcorn and pretzels. Onion salt, garlic salt, seasoned salt, table salt, and sea salt. Worcestershire sauce. Tartar sauce. Barbecue sauce. Teriyaki sauce. Soy sauce, including reduced-sodium. Steak sauce. Canned and packaged gravies. Fish sauce. Oyster sauce. Cocktail sauce. Horseradish that you find on the shelf. Ketchup. Mustard. Meat flavorings and tenderizers. Bouillon cubes. Hot sauce and Tabasco sauce. Premade or packaged marinades. Premade or packaged taco seasonings. Relishes. Regular salad dressings. Where to find more information:  National Heart, Lung, and [place]: https://wilson-eaton.com/  American Heart Association: www.heart.org Summary  The DASH eating plan is a healthy eating plan that has been shown to reduce high blood pressure (hypertension). It may also reduce your risk for type 2 diabetes, heart disease, and stroke.  With the DASH eating plan, you should limit salt (sodium) intake to 2,300 mg a day. If you have hypertension, you may need to reduce your sodium intake to 1,500 mg a day.  When on the DASH eating plan, aim to eat more fresh fruits and vegetables, whole grains, lean proteins, low-fat dairy, and heart-healthy fats.  Work with your health care provider or diet and nutrition specialist (  dietitian) to adjust your eating plan to your individual calorie needs. This information is not intended to replace advice given to you by your health care provider. Make sure you discuss any questions you have with your health care provider. Document Released: 03/12/2011 Document Revised: 03/16/2016 Document Reviewed: 03/16/2016 Elsevier Interactive Patient Education  2018 [employer].  Hypertension Hypertension is another name for  high blood pressure. High blood pressure forces your heart to work harder to pump blood. This can cause problems over time. There are two numbers in a blood pressure reading. There is a top number (systolic) over a bottom number (diastolic). It is best to have a blood pressure below 120/80. Healthy choices can help lower your blood pressure. You may need medicine to help lower your blood pressure if:  Your blood pressure cannot be lowered with healthy choices.  Your blood pressure is higher than 130/80.  Follow these instructions at home: Eating and drinking  If directed, follow the DASH eating plan. This diet includes: ? Filling half of your plate at each meal with fruits and vegetables. ? Filling one quarter of your plate at each meal with whole grains. Whole grains include whole wheat pasta, brown rice, and whole grain bread. ? Eating or drinking low-fat dairy products, such as skim milk or low-fat yogurt. ? Filling one quarter of your plate at each meal with low-fat (lean) proteins. Low-fat proteins include fish, skinless chicken, eggs, beans, and tofu. ? Avoiding fatty meat, cured and processed meat, or chicken with skin. ? Avoiding premade or processed food.  Eat less than 1,500 mg of salt (sodium) a day.  Limit alcohol use to no more than 1 drink a day for nonpregnant women and 2 drinks a day for men. One drink equals 12 oz of beer, 5 oz of wine, or 1 oz of hard liquor. Lifestyle  Work with your doctor to stay at a healthy weight or to lose weight. Ask your doctor what the best weight is for you.  Get at least 30 minutes of exercise that causes your heart to beat faster (aerobic exercise) most days of the week. This may include walking, swimming, or biking.  Get at least 30 minutes of exercise that strengthens your muscles (resistance exercise) at least 3 days a week. This may include lifting weights or pilates.  Do not use any products that contain nicotine or tobacco. This  includes cigarettes and e-cigarettes. If you need help quitting, ask your doctor.  Check your blood pressure at home as told by your doctor.  Keep all follow-up visits as told by your doctor. This is important. Medicines  Take over-the-counter and prescription medicines only as told by your doctor. Follow directions carefully.  Do not skip doses of blood pressure medicine. The medicine does not work as well if you skip doses. Skipping doses also puts you at risk for problems.  Ask your doctor about side effects or reactions to medicines that you should watch for. Contact a doctor if:  You think you are having a reaction to the medicine you are taking.  You have headaches that keep coming back (recurring).  You feel dizzy.  You have swelling in your ankles.  You have trouble with your vision. Get help right away if:  You get a very bad headache.  You start to feel confused.  You feel weak or numb.  You feel faint.  You get very bad pain in your: ? Chest. ? Belly (abdomen).  You throw up (  vomit) more than once.  You have trouble breathing. Summary  Hypertension is another name for high blood pressure.  Making healthy choices can help lower blood pressure. If your blood pressure cannot be controlled with healthy choices, you may need to take medicine. This information is not intended to replace advice given to you by your health care provider. Make sure you discuss any questions you have with your health care provider. Document Released: 09/09/2007 Document Revised: 02/19/2016 Document Reviewed: 02/19/2016 Elsevier Interactive Patient Education  2018 [employer].  Edema Edema is an abnormal buildup of fluids in your bodytissues. Edema is somewhatdependent on gravity to pull the fluid to the lowest place in your body. That makes the condition more common in the legs and thighs (lower extremities). Painless swelling of the feet and ankles is common and becomes more  likely as you get older. It is also common in looser tissues, like around your eyes. When the affected area is squeezed, the fluid may move out of that spot and leave a dent for a few moments. This dent is called pitting. What are the causes? There are many possible causes of edema. Eating too much salt and being on your feet or sitting for a long time can cause edema in your legs and ankles. Hot weather may make edema worse. Common medical causes of edema include:  Heart failure.  Liver disease.  Kidney disease.  Weak blood vessels in your legs.  Cancer.  An injury.  Pregnancy.  Some medications.  Obesity.  What are the signs or symptoms? Edema is usually painless.Your skin may look swollen or shiny. How is this diagnosed? Your health care provider may be able to diagnose edema by asking about your medical history and doing a physical exam. You may need to have tests such as X-rays, an electrocardiogram, or blood tests to check for medical conditions that may cause edema. How is this treated? Edema treatment depends on the cause. If you have heart, liver, or kidney disease, you need the treatment appropriate for these conditions. General treatment may include:  Elevation of the affected body part above the level of your heart.  Compression of the affected body part. Pressure from elastic bandages or support stockings squeezes the tissues and forces fluid back into the blood vessels. This keeps fluid from entering the tissues.  Restriction of fluid and salt intake.  Use of a water pill (diuretic). These medications are appropriate only for some types of edema. They pull fluid out of your body and make you urinate more often. This gets rid of fluid and reduces swelling, but diuretics can have side effects. Only use diuretics as directed by your health care provider.  Follow these instructions at home:  Keep the affected body part above the level of your heart when you are  lying down.  Do not sit still or stand for prolonged periods.  Do not put anything directly under your knees when lying down.  Do not wear constricting clothing or garters on your upper legs.  Exercise your legs to work the fluid back into your blood vessels. This may help the swelling go down.  Wear elastic bandages or support stockings to reduce ankle swelling as directed by your health care provider.  Eat a low-salt diet to reduce fluid if your health care provider recommends it.  Only take medicines as directed by your health care provider. Contact a health care provider if:  Your edema is not responding to treatment.  You  have heart, liver, or kidney disease and notice symptoms of edema.  You have edema in your legs that does not improve after elevating them.  You have sudden and unexplained weight gain. Get help right away if:  You develop shortness of breath or chest pain.  You cannot breathe when you lie down.  You develop pain, redness, or warmth in the swollen areas.  You have heart, liver, or kidney disease and suddenly get edema.  You have a fever and your symptoms suddenly get worse. This information is not intended to replace advice given to you by your health care provider. Make sure you discuss any questions you have with your health care provider. Document Released: 03/23/2005 Document Revised: 08/29/2015 Document Reviewed: 01/13/2013 Elsevier Interactive Patient Education  2017 [employer].

## 2017-08-05 NOTE — Progress Notes (Signed)
Pre visit review using our clinic review tool, if applicable. No additional management support is needed unless otherwise documented below in the visit note. 

## 2017-08-06 ENCOUNTER — Encounter: Payer: Self-pay | Admitting: Internal Medicine

## 2017-08-06 DIAGNOSIS — M4802 Spinal stenosis, cervical region: Secondary | ICD-10-CM | POA: Insufficient documentation

## 2017-08-06 LAB — MICROALBUMIN / CREATININE URINE RATIO
CREATININE, U: 71.3 mg/dL
Microalb Creat Ratio: 12.5 mg/g (ref 0.0–30.0)
Microalb, Ur: 8.9 mg/dL — ABNORMAL HIGH (ref 0.0–1.9)

## 2017-08-06 LAB — CBC WITH DIFFERENTIAL/PLATELET
Basophils Absolute: 0.1 10*3/uL (ref 0.0–0.1)
Basophils Relative: 1.8 % (ref 0.0–3.0)
Eosinophils Absolute: 0.1 10*3/uL (ref 0.0–0.7)
Eosinophils Relative: 1.2 % (ref 0.0–5.0)
HEMATOCRIT: 40.5 % (ref 39.0–52.0)
Hemoglobin: 13.1 g/dL (ref 13.0–17.0)
LYMPHS ABS: 1.8 10*3/uL (ref 0.7–4.0)
LYMPHS PCT: 42 % (ref 12.0–46.0)
MCHC: 32.3 g/dL (ref 30.0–36.0)
MCV: 77.9 fl — AB (ref 78.0–100.0)
MONOS PCT: 9 % (ref 3.0–12.0)
Monocytes Absolute: 0.4 10*3/uL (ref 0.1–1.0)
NEUTROS PCT: 46 % (ref 43.0–77.0)
Neutro Abs: 2 10*3/uL (ref 1.4–7.7)
Platelets: 236 10*3/uL (ref 150.0–400.0)
RBC: 5.2 Mil/uL (ref 4.22–5.81)
RDW: 16 % — ABNORMAL HIGH (ref 11.5–15.5)
WBC: 4.4 10*3/uL (ref 4.0–10.5)

## 2017-08-06 LAB — URINALYSIS, ROUTINE W REFLEX MICROSCOPIC
Bilirubin, UA: NEGATIVE
Glucose, UA: NEGATIVE
Ketones, UA: NEGATIVE
Leukocytes, UA: NEGATIVE
NITRITE UA: NEGATIVE
RBC, UA: NEGATIVE
Specific Gravity, UA: 1.014 (ref 1.005–1.030)
Urobilinogen, Ur: 0.2 mg/dL (ref 0.2–1.0)
pH, UA: 7.5 (ref 5.0–7.5)

## 2017-08-06 LAB — COMPREHENSIVE METABOLIC PANEL
ALK PHOS: 53 U/L (ref 39–117)
ALT: 23 U/L (ref 0–53)
AST: 44 U/L — ABNORMAL HIGH (ref 0–37)
Albumin: 4.1 g/dL (ref 3.5–5.2)
BILIRUBIN TOTAL: 0.6 mg/dL (ref 0.2–1.2)
BUN: 15 mg/dL (ref 6–23)
CALCIUM: 9.8 mg/dL (ref 8.4–10.5)
CO2: 31 mEq/L (ref 19–32)
Chloride: 101 mEq/L (ref 96–112)
Creatinine, Ser: 1.18 mg/dL (ref 0.40–1.50)
GFR: 74.98 mL/min (ref >60.00)
Glucose, Bld: 95 mg/dL (ref 70–99)
Potassium: 3.8 mEq/L (ref 3.5–5.1)
Sodium: 140 mEq/L (ref 135–145)
TOTAL PROTEIN: 7.9 g/dL (ref 6.0–8.3)

## 2017-08-06 LAB — IRON,TIBC AND FERRITIN PANEL
%SAT: 26 % (ref 15–60)
Ferritin: 57 ng/mL (ref 20–380)
Iron: 85 ug/dL (ref 50–180)
TIBC: 322 mcg/dL (calc) (ref 250–425)

## 2017-08-06 LAB — T4, FREE: Free T4: 0.75 ng/dL (ref 0.60–1.60)

## 2017-08-06 LAB — VITAMIN D 25 HYDROXY (VIT D DEFICIENCY, FRACTURES): VITD: 38.5 ng/mL (ref 30.00–100.00)

## 2017-08-06 LAB — TSH: TSH: 2.03 u[IU]/mL (ref 0.35–4.50)

## 2017-08-06 LAB — HEMOGLOBIN A1C: Hgb A1c MFr Bld: 6.4 % (ref 4.6–6.5)

## 2017-08-06 NOTE — Progress Notes (Addendum)
Chief Complaint  Patient presents with  . New Patient (Initial Visit)   New patient former pt at [facility]. With niece today Peggyann Shoals 1. HTN uncontrolled on coreg 25 bid, enalapril 10-hctz 25 mg qd he was recently stopped on norvasc 10 mg qd due to leg edema at urgent care  2. Leg edema b/l with leg pain since 07/08/17 saw urgent care and norvasc 10 stopped and rx tramadol  3. DM 2 on Metformin XR 500 mg qd; sees Dr. [facility]    Review of Systems  Constitutional: Negative for weight loss.  HENT: Negative for hearing loss.   Eyes:       Blind in left eye   Respiratory: Negative for shortness of breath.   Cardiovascular: Positive for leg swelling. Negative for chest pain.  Gastrointestinal: Negative for abdominal pain.  Musculoskeletal: Negative for falls.  Skin: Negative for rash.  Neurological: Negative for headaches.  Psychiatric/Behavioral: Negative for depression.   Past Medical History:  Diagnosis Date  . Diabetes mellitus without complication ([place])   . Hyperlipidemia   . Hypertension    Past Surgical History:  Procedure Laterality Date  . APPENDECTOMY     1985  . BRAIN SURGERY  1961   removed bone from inside skull that was pressing on optic nerve  . HERNIA REPAIR    . TOE AMPUTATION     right great toe 2016   Family History  Problem Relation Age of Onset  . Colon cancer Brother   . Cancer Brother        colon   . Stroke Brother   . Heart disease Daughter        MI   Social History   Socioeconomic History  . Marital status: Widowed    Spouse name: Not on file  . Number of children: 3  . Years of education: Not on file  . Highest education level: Not on file  Occupational History  . Not on file  Social Needs  . Financial resource strain: Not on file  . Food insecurity:    Worry: Not on file    Inability: Not on file  . Transportation needs:    Medical: Not on file    Non-medical: Not on file  Tobacco Use  . Smoking status: Former  Smoker    Last attempt to quit: 1961    Years since quitting: 58.3  . Smokeless tobacco: Never Used  Substance and Sexual Activity  . Alcohol use: No  . Drug use: Never  . Sexual activity: Not Currently  Lifestyle  . Physical activity:    Days per week: Not on file    Minutes per session: Not on file  . Stress: Not on file  Relationships  . Social connections:    Talks on phone: Not on file    Gets together: Not on file    Attends religious service: Not on file    Active member of club or organization: Not on file    Attends meetings of clubs or organizations: Not on file    Relationship status: Not on file  . Intimate partner violence:    Fear of current or ex partner: Not on file    Emotionally abused: Not on file    Physically abused: Not on file    Forced sexual activity: Not on file  Other Topics Concern  . Not on file  Social History Narrative   3 kids    Niece Peggyann Shoals  [phone] involved in care    Owns guns, wears seat belts, safe in relationship   Retired    Some high school ed    Current Meds  Medication Sig  . acetaminophen (TYLENOL) 500 MG tablet Take 1,000 mg by mouth 2 (two) times daily.  Marland Kitchen aspirin EC 81 MG tablet Take 81 mg by mouth daily.  . carvedilol (COREG) 25 MG tablet Take 1 tablet by mouth 2 (two) times daily.  . metFORMIN (GLUCOPHAGE-XR) 500 MG 24 hr tablet Take 1 tablet by mouth daily.  . Omega-3 Fatty Acids (FISH OIL) 1000 MG CAPS Take 1 capsule by mouth 2 (two) times daily.  . traMADol (ULTRAM) 50 MG tablet Take 1 tablet (50 mg total) by mouth 2 (two) times daily as needed.  . tuberculin (APLISOL) 5 UNIT/0.1ML injection Inject into the skin.  . [DISCONTINUED] enalapril-hydrochlorothiazide (VASERETIC) 10-25 MG tablet Take 1 tablet by mouth daily.  . [DISCONTINUED] traMADol (ULTRAM) 50 MG tablet Take 1 tablet (50 mg total) by mouth every 8 (eight) hours as needed.   No Known Allergies Recent Results (from the past 2160 hour(s))  HM  DIABETES EYE EXAM     Status: None   Collection Time: 07/08/17 12:00 AM  Result Value Ref Range   HM Diabetic Eye Exam No Retinopathy No Retinopathy    Comment: Dr. Gertie Baron 07/08/17 neg DM retinopathy and edema neg   Comprehensive metabolic panel     Status: Abnormal   Collection Time: 08/05/17  4:01 PM  Result Value Ref Range   Sodium 140 135 - 145 mEq/L   Potassium 3.8 3.5 - 5.1 mEq/L   Chloride 101 96 - 112 mEq/L   CO2 31 19 - 32 mEq/L   Glucose, Bld 95 70 - 99 mg/dL   BUN 15 6 - 23 mg/dL   Creatinine, Ser 1.18 0.40 - 1.50 mg/dL   Total Bilirubin 0.6 0.2 - 1.2 mg/dL   Alkaline Phosphatase 53 39 - 117 U/L   AST 44 (H) 0 - 37 U/L   ALT 23 0 - 53 U/L   Total Protein 7.9 6.0 - 8.3 g/dL   Albumin 4.1 3.5 - 5.2 g/dL   Calcium 9.8 8.4 - 10.5 mg/dL   GFR 74.98 >60.00 mL/min  CBC w/Diff     Status: Abnormal   Collection Time: 08/05/17  4:01 PM  Result Value Ref Range   WBC 4.4 4.0 - 10.5 K/uL   RBC 5.20 4.22 - 5.81 Mil/uL   Hemoglobin 13.1 13.0 - 17.0 g/dL   HCT 40.5 39.0 - 52.0 %   MCV 77.9 (L) 78.0 - 100.0 fl   MCHC 32.3 30.0 - 36.0 g/dL   RDW 16.0 (H) 11.5 - 15.5 %   Platelets 236.0 150.0 - 400.0 K/uL   Neutrophils Relative % 46.0 43.0 - 77.0 %   Lymphocytes Relative 42.0 12.0 - 46.0 %   Monocytes Relative 9.0 3.0 - 12.0 %   Eosinophils Relative 1.2 0.0 - 5.0 %   Basophils Relative 1.8 0.0 - 3.0 %   Neutro Abs 2.0 1.4 - 7.7 K/uL   Lymphs Abs 1.8 0.7 - 4.0 K/uL   Monocytes Absolute 0.4 0.1 - 1.0 K/uL   Eosinophils Absolute 0.1 0.0 - 0.7 K/uL   Basophils Absolute 0.1 0.0 - 0.1 K/uL  Hemoglobin A1c     Status: None   Collection Time: 08/05/17  4:01 PM  Result Value Ref Range   Hgb A1c MFr Bld 6.4 4.6 -  6.5 %    Comment: Glycemic Control Guidelines for People with Diabetes:Non Diabetic:  <6%Goal of Therapy: <7%Additional Action Suggested:  >8%   TSH     Status: None   Collection Time: 08/05/17  4:01 PM  Result Value Ref Range   TSH 2.03 0.35 - 4.50 uIU/mL  T4, free      Status: None   Collection Time: 08/05/17  4:01 PM  Result Value Ref Range   Free T4 0.75 0.60 - 1.60 ng/dL    Comment: Specimens from patients who are undergoing biotin therapy and /or ingesting biotin supplements may contain high levels of biotin.  The higher biotin concentration in these specimens interferes with this Free T4 assay.  Specimens that contain high levels  of biotin may cause false high results for this Free T4 assay.  Please interpret results in light of the total clinical presentation of the patient.    Urinalysis, Routine w reflex microscopic     Status: None   Collection Time: 08/05/17  4:01 PM  Result Value Ref Range   Specific Gravity, UA 1.014 1.005 - 1.030   pH, UA 7.5 5.0 - 7.5   Color, UA Yellow Yellow   Appearance Ur Clear Clear   Leukocytes, UA Negative Negative   Protein, UA Trace Negative/Trace   Glucose, UA Negative Negative   Ketones, UA Negative Negative   RBC, UA Negative Negative   Bilirubin, UA Negative Negative   Urobilinogen, Ur 0.2 0.2 - 1.0 mg/dL   Nitrite, UA Negative Negative   Microscopic Examination Comment     Comment: Microscopic not indicated and not performed.  Urine Microalbumin w/creat. ratio     Status: Abnormal   Collection Time: 08/05/17  4:01 PM  Result Value Ref Range   Microalb, Ur 8.9 (H) 0.0 - 1.9 mg/dL   Creatinine,U 71.3 mg/dL   Microalb Creat Ratio 12.5 0.0 - 30.0 mg/g  Iron, TIBC and Ferritin Panel     Status: None   Collection Time: 08/05/17  4:01 PM  Result Value Ref Range   Iron 85 50 - 180 mcg/dL   TIBC 322 250 - 425 mcg/dL (calc)   %SAT 26 15 - 60 % (calc)   Ferritin 57 20 - 380 ng/mL  Vitamin D (25 hydroxy)     Status: None   Collection Time: 08/05/17  4:01 PM  Result Value Ref Range   VITD 38.50 30.00 - 100.00 ng/mL   Objective  Body mass index is 28.21 kg/m. Wt Readings from Last 3 Encounters:  08/05/17 208 lb (94.3 kg)  07/08/17 210 lb (95.3 kg)  11/21/13 209 lb (94.8 kg)   Temp Readings from Last  3 Encounters:  08/05/17 98.1 F (36.7 C) (Oral)  07/08/17 98.4 F (36.9 C) (Oral)  07/03/14 99.1 F (37.3 C)   BP Readings from Last 3 Encounters:  08/05/17 (!) 160/90  07/08/17 (!) 168/82  06/28/14 (!) 162/78   Pulse Readings from Last 3 Encounters:  08/05/17 84  07/08/17 80  06/28/14 73    Physical Exam  Constitutional: He is oriented to person, place, and time. Vital signs are normal. He appears well-developed and well-nourished. He is cooperative.  HENT:  Head: Normocephalic and atraumatic.  Mouth/Throat: Oropharynx is clear and moist and mucous membranes are normal.  Eyes: Pupils are equal, round, and reactive to light. Conjunctivae are normal.  Cardiovascular: Normal rate and regular rhythm.  Murmur heard. 2-3 + pitting edema b/l legs with pain   Pulmonary/Chest: Effort normal  and breath sounds normal.  Neurological: He is alert and oriented to person, place, and time. Gait normal.  Walks with cane  Skin: Skin is warm, dry and intact.  Some mild ulcerations b/l legs  Psychiatric: He has a normal mood and affect. His speech is normal and behavior is normal. Judgment and thought content normal. Cognition and memory are normal.  Nursing note and vitals reviewed. lipoma large to left upper back   Assessment   1. HTN uncontrolled 2. DM 2 3. Leg edema b/l and pain  4. Cardiac murmur  5. HM Plan   1.  D/c enalapril-hctz 10-25 change to telmisartan 80 mg qd, chlorthalidone 12.5 mg qd  Hold norvasc due to leg edema Cont coreg 25 mg bid  2.  Cont metformin XR 500 mg qd  Get eye records Dr. Jacinto Reap [place] 2019 Obtained records 07/08/17 neg DM retinopathy and macular edema   Check labs today CMET, CBC, vit D, A1C, TSH, T5, UA, urine protein, anemia panel  3. Venous and arterial doppler b/l  Refer to vascular  Prn tramadol short term  4. Ordered echo  5.  Never had flu shot  Tdap had 05/07/14 pna 23 due at f/u  prevnar had 05/07/14  Zoster had 05/07/14 consider shingrix in  future   Never had colonoscopy  Out of age window PSA Get records [facility]  -obtained and reviewed  H/o venous stasis dermatitis, amp right great toe  Beta thalassemia  OA Optic neuropathy, ischemic  HLD DM2 with nephropathy  Right BBB HTN -was on coreg 25 mg bid, metformin 500 mg ER, enalapril 20 mg qd, hctz 25 mg qd, was on norvasc 10 mg qd, atenolol 25  pna 23 had 03/03/06 prevnar 05/07/14 Tdap 05/07/14 Zoster 05/07/14  OA  -has been on diclofenac, arthrotec, celebrex, meloxicam Labs 07/20/16 Cr 1.37 GFR 54, glucose 158, albumin 4.4 total protein 7.8, AST 44 slightly elevated ALT 23 nl  TC 190, TG 299, HDL 41, LDL 89  A1C 6.2  Free T4 0.98 normal, TSH 2.220 nl   Niece vickie morrow [phone]   Provider: Dr. Olivia Mackie McLean-Scocuz[facility]

## 2017-08-13 ENCOUNTER — Encounter: Payer: Self-pay | Admitting: Internal Medicine

## 2017-08-17 ENCOUNTER — Ambulatory Visit (INDEPENDENT_AMBULATORY_CARE_PROVIDER_SITE_OTHER): Payer: Medicare Other | Admitting: Vascular Surgery

## 2017-08-17 ENCOUNTER — Encounter (INDEPENDENT_AMBULATORY_CARE_PROVIDER_SITE_OTHER): Payer: Self-pay | Admitting: Vascular Surgery

## 2017-08-17 VITALS — BP 216/86 | HR 63 | Resp 17 | Ht 73.0 in | Wt 204.0 lb

## 2017-08-17 DIAGNOSIS — E785 Hyperlipidemia, unspecified: Secondary | ICD-10-CM

## 2017-08-17 DIAGNOSIS — R600 Localized edema: Secondary | ICD-10-CM

## 2017-08-17 DIAGNOSIS — E119 Type 2 diabetes mellitus without complications: Secondary | ICD-10-CM | POA: Diagnosis not present

## 2017-08-17 DIAGNOSIS — I10 Essential (primary) hypertension: Secondary | ICD-10-CM

## 2017-08-17 NOTE — Progress Notes (Signed)
Subjective:    Patient ID: Lucas Jackson, male    DOB: [birth year], 84 y.o.   MRN: [id number] Chief Complaint  Patient presents with  . New Patient (Initial Visit)    Bialteral leg edema   Presents as a new patient referred by Dr. Aundra Dubin for bilateral lower extremity edema.  The patient was seen with his family member.  The patient has a long-standing history of edema to the lower extremity.  The patient is ordered to undergo a echocardiogram, bilateral lower extremity venous duplex and lower extremity arterial duplex ordered by Dr. Algernon Huxley next week.  At a long discussion with the patient and his family member in regard to controlling any type of lower extremity edema with compression and elevation.  The patient's family member states that the patient is 83 years old and he is unable to place compression socks on independently.  The patient currently lives by himself at home.  We discussed controlling his edema with bilateral 3 layer zinc oxide Unna wraps which are to be changed on a weekly basis.  The patient's family member stated it would be too difficult for the patient to come to our office on a weekly basis for changes and asked if we could apply for home nursing services.  Had a long discussion with the patient about proper elevation as heart level or higher.  The patient has not been to a podiatrist in a few years as his last podiatrist cut his nails "too short" and created a wound.  At the present time, the patient denies any claudication-like symptoms, rest pain or ulceration to the lower extremity.  The patient states pain in his bilateral knees.  The patient notes that the swelling in his lower extremity is worse towards the end of the day.  The patient denies any fever, nausea or vomiting.  Review of Systems  Constitutional: Negative.   HENT: Negative.   Eyes: Negative.   Respiratory: Negative.   Cardiovascular: Positive for leg swelling.  Gastrointestinal: Negative.   Endocrine:  Negative.   Genitourinary: Negative.   Musculoskeletal: Negative.   Skin: Negative.   Allergic/Immunologic: Negative.   Neurological: Negative.   Hematological: Negative.   Psychiatric/Behavioral: Negative.       Objective:   Physical Exam  Constitutional: He is oriented to person, place, and time. He appears well-developed and well-nourished. No distress.  HENT:  Head: Normocephalic and atraumatic.  Right Ear: External ear normal.  Left Ear: External ear normal.  Eyes: Pupils are equal, round, and reactive to light. Conjunctivae are normal.  Neck: Normal range of motion.  Cardiovascular: Normal rate, regular rhythm, normal heart sounds and intact distal pulses.  Pulses:      Radial pulses are 2+ on the right side, and 2+ on the left side.  Hard to palpate pedal pulses due to edema however the bilateral feet are warm  Pulmonary/Chest: Effort normal and breath sounds normal.  Musculoskeletal: Normal range of motion. He exhibits edema (Moderate nonpitting bilateral lower extremity edema noted).  Neurological: He is alert and oriented to person, place, and time.  Skin: He is not diaphoretic.  Moderate stasis dermatitis, fibrosis noted to the bilateral lower extremity.  There are no active ulcerations.  There is no active cellulitis.  Less than 1 cm scattered varicosities noted to bilateral lower extremity  Psychiatric: He has a normal mood and affect. His behavior is normal. Judgment and thought content normal.  Vitals reviewed.  BP (!) 216/86 (BP Location: Right Arm,  Patient Position: Sitting)   Pulse 63   Resp 17   Ht 6\' 1"  (1.854 m)   Wt 204 lb (92.5 kg)   BMI 26.91 kg/m   Past Medical History:  Diagnosis Date  . Diabetes mellitus without complication ([place])   . Hyperlipidemia   . Hypertension    Social History   Socioeconomic History  . Marital status: Widowed    Spouse name: Not on file  . Number of children: 3  . Years of education: Not on file  . Highest  education level: Not on file  Occupational History  . Not on file  Social Needs  . Financial resource strain: Not on file  . Food insecurity:    Worry: Not on file    Inability: Not on file  . Transportation needs:    Medical: Not on file    Non-medical: Not on file  Tobacco Use  . Smoking status: Former Smoker    Last attempt to quit: 1961    Years since quitting: 58.4  . Smokeless tobacco: Never Used  Substance and Sexual Activity  . Alcohol use: No  . Drug use: Never  . Sexual activity: Not Currently  Lifestyle  . Physical activity:    Days per week: Not on file    Minutes per session: Not on file  . Stress: Not on file  Relationships  . Social connections:    Talks on phone: Not on file    Gets together: Not on file    Attends religious service: Not on file    Active member of club or organization: Not on file    Attends meetings of clubs or organizations: Not on file    Relationship status: Not on file  . Intimate partner violence:    Fear of current or ex partner: Not on file    Emotionally abused: Not on file    Physically abused: Not on file    Forced sexual activity: Not on file  Other Topics Concern  . Not on file  Social History Narrative   3 kids    Niece Peggyann Shoals [phone] involved in care    Owns guns, wears seat belts, safe in relationship   Retired, widower   Some high school ed    Ambulance person    Past Surgical History:  Procedure Laterality Date  . APPENDECTOMY     1985  . BRAIN SURGERY  1961   removed bone from inside skull that was pressing on optic nerve  . HERNIA REPAIR     umbilical 0973   . TOE AMPUTATION     right great toe 2016   Family History  Problem Relation Age of Onset  . Colon cancer Brother   . Cancer Brother        colon   . Stroke Brother   . Heart disease Daughter        MI   No Known Allergies     Assessment & Plan:  Presents as a new patient referred by Dr. Aundra Dubin for bilateral lower extremity  edema.  The patient was seen with his family member.  The patient has a long-standing history of edema to the lower extremity.  The patient is ordered to undergo a echocardiogram, bilateral lower extremity venous duplex and lower extremity arterial duplex ordered by Dr. Algernon Huxley next week.  At a long discussion with the patient and his family member in regard to controlling any type of lower extremity edema  with compression and elevation.  The patient's family member states that the patient is 84 years old and he is unable to place compression socks on independently.  The patient currently lives by himself at home.  We discussed controlling his edema with bilateral 3 layer zinc oxide Unna wraps which are to be changed on a weekly basis.  The patient's family member stated it would be too difficult for the patient to come to our office on a weekly basis for changes and asked if we could apply for home nursing services.  Had a long discussion with the patient about proper elevation as heart level or higher.  The patient has not been to a podiatrist in a few years as his last podiatrist cut his nails "too short" and created a wound.  At the present time, the patient denies any claudication-like symptoms, rest pain or ulceration to the lower extremity.  The patient states pain in his bilateral knees.  The patient notes that the swelling in his lower extremity is worse towards the end of the day.  The patient denies any fever, nausea or vomiting.  1. Bilateral leg edema - New A bilateral lower extremity venous duplex has been ordered by Dr. Marigene Ehlers The patient is scheduled to undergo the study next week The patient's family member states that the patient cannot wear conventional compression socks due to his age / lack of help at home. We discussed [place] wraps - she also feels that he cannot wear these for the same reasons. We decided to try Unna wraps however she feels that he is unable to travel to the office on a  weekly basis to have them changed. I will try to apply for home nursing services to apply the Unna wraps to the patient's bilateral lower extremity to gain control of the patient's edema.   Normally once edema is controlled I would transition him to compression socks - unsure at this point if we will be able to do that as the patient's family member seems to be resistant.  The patient's family members also resistant to moving forward with the lymphedema pump that she feels he would not be able to do this independently either. The family may have to hire a home health aide to help the patient place the socks / lymphedema pump on on a daily basis. The patient is to elevate his legs heart level or higher as much as possible The patient will follow up in one months to asses the progress patient has made with 3 layer zinc oxide and wraps  The patient was instructed to call the office in the interim if any worsening edema or ulcerations to the legs, feet or toes occurs. The patient expresses their understanding  2. Hyperlipidemia, unspecified hyperlipidemia type - Stable Encouraged good control as its slows the progression of atherosclerotic disease  3. Type 2 diabetes mellitus without complication, without long-term current use of insulin ([facility]CC) - Stable Encouraged good control as its slows the progression of atherosclerotic disease  4. Essential hypertension - Stable Encouraged good control as its slows the progression of atherosclerotic disease  Current Outpatient Medications on File Prior to Visit  Medication Sig Dispense Refill  . acetaminophen (TYLENOL) 500 MG tablet Take 1,000 mg by mouth 2 (two) times daily.    Marland Kitchen aspirin EC 81 MG tablet Take 81 mg by mouth daily.    . carvedilol (COREG) 25 MG tablet Take 1 tablet by mouth 2 (two) times daily.  2  .  chlorthalidone (HYGROTON) 25 MG tablet Take 0.5 tablets (12.5 mg total) by mouth daily. 45 tablet 1  . metFORMIN (GLUCOPHAGE-XR) 500 MG 24 hr  tablet Take 1 tablet by mouth daily.  2  . Omega-3 Fatty Acids (FISH OIL) 1000 MG CAPS Take 1 capsule by mouth 2 (two) times daily.    Marland Kitchen telmisartan (MICARDIS) 80 MG tablet Take 1 tablet (80 mg total) by mouth daily. 90 tablet 1  . tuberculin (APLISOL) 5 UNIT/0.1ML injection Inject into the skin.    Marland Kitchen traMADol (ULTRAM) 50 MG tablet Take 1 tablet (50 mg total) by mouth 2 (two) times daily as needed. (Patient not taking: Reported on 08/17/2017) 10 tablet 0   No current facility-administered medications on file prior to visit.    There are no Patient Instructions on file for this visit. No follow-ups on file.  KIMBERLY A STEGMAYER, PA-C

## 2017-08-18 ENCOUNTER — Ambulatory Visit: Payer: Medicare Other

## 2017-08-19 ENCOUNTER — Ambulatory Visit (INDEPENDENT_AMBULATORY_CARE_PROVIDER_SITE_OTHER): Payer: Medicare Other | Admitting: Internal Medicine

## 2017-08-19 ENCOUNTER — Encounter: Payer: Self-pay | Admitting: Internal Medicine

## 2017-08-19 VITALS — BP 160/84 | HR 66 | Temp 98.3°F | Resp 16 | Ht 71.0 in | Wt 205.6 lb

## 2017-08-19 DIAGNOSIS — Z1159 Encounter for screening for other viral diseases: Secondary | ICD-10-CM

## 2017-08-19 DIAGNOSIS — I10 Essential (primary) hypertension: Secondary | ICD-10-CM

## 2017-08-19 DIAGNOSIS — Z13818 Encounter for screening for other digestive system disorders: Secondary | ICD-10-CM

## 2017-08-19 DIAGNOSIS — Z23 Encounter for immunization: Secondary | ICD-10-CM | POA: Diagnosis not present

## 2017-08-19 DIAGNOSIS — R748 Abnormal levels of other serum enzymes: Secondary | ICD-10-CM

## 2017-08-19 LAB — LIPID PANEL
CHOLESTEROL: 183 mg/dL (ref 0–200)
HDL: 46.2 mg/dL (ref >39.00)
LDL Cholesterol: 107 mg/dL — ABNORMAL HIGH (ref 0–99)
NonHDL: 136.69
TRIGLYCERIDES: 150 mg/dL — AB (ref 0.0–149.0)
Total CHOL/HDL Ratio: 4
VLDL: 30 mg/dL (ref 0.0–40.0)

## 2017-08-19 LAB — BASIC METABOLIC PANEL
BUN: 21 mg/dL (ref 6–23)
CO2: 29 mEq/L (ref 19–32)
Calcium: 9.9 mg/dL (ref 8.4–10.5)
Chloride: 101 mEq/L (ref 96–112)
Creatinine, Ser: 1.26 mg/dL (ref 0.40–1.50)
GFR: 69.51 mL/min (ref >60.00)
Glucose, Bld: 129 mg/dL — ABNORMAL HIGH (ref 70–99)
POTASSIUM: 3.9 meq/L (ref 3.5–5.1)
SODIUM: 137 meq/L (ref 135–145)

## 2017-08-19 MED ORDER — CHLORTHALIDONE 25 MG PO TABS: 25.0000 mg | ORAL_TABLET | Freq: Every day | ORAL | 1 refills | Status: DC

## 2017-08-19 NOTE — Patient Instructions (Addendum)
F/u in 6 weeks sooner if needed  Take a whole pill chlorthalidone 25 mg. New pill will be 1 pill instead of 2 halves  For now take 2 halves   Edema Edema is an abnormal buildup of fluids in your bodytissues. Edema is somewhatdependent on gravity to pull the fluid to the lowest place in your body. That makes the condition more common in the legs and thighs (lower extremities). Painless swelling of the feet and ankles is common and becomes more likely as you get older. It is also common in looser tissues, like around your eyes. When the affected area is squeezed, the fluid may move out of that spot and leave a dent for a few moments. This dent is called pitting. What are the causes? There are many possible causes of edema. Eating too much salt and being on your feet or sitting for a long time can cause edema in your legs and ankles. Hot weather may make edema worse. Common medical causes of edema include:  Heart failure.  Liver disease.  Kidney disease.  Weak blood vessels in your legs.  Cancer.  An injury.  Pregnancy.  Some medications.  Obesity.  What are the signs or symptoms? Edema is usually painless.Your skin may look swollen or shiny. How is this diagnosed? Your health care provider may be able to diagnose edema by asking about your medical history and doing a physical exam. You may need to have tests such as X-rays, an electrocardiogram, or blood tests to check for medical conditions that may cause edema. How is this treated? Edema treatment depends on the cause. If you have heart, liver, or kidney disease, you need the treatment appropriate for these conditions. General treatment may include:  Elevation of the affected body part above the level of your heart.  Compression of the affected body part. Pressure from elastic bandages or support stockings squeezes the tissues and forces fluid back into the blood vessels. This keeps fluid from entering the  tissues.  Restriction of fluid and salt intake.  Use of a water pill (diuretic). These medications are appropriate only for some types of edema. They pull fluid out of your body and make you urinate more often. This gets rid of fluid and reduces swelling, but diuretics can have side effects. Only use diuretics as directed by your health care provider.  Follow these instructions at home:  Keep the affected body part above the level of your heart when you are lying down.  Do not sit still or stand for prolonged periods.  Do not put anything directly under your knees when lying down.  Do not wear constricting clothing or garters on your upper legs.  Exercise your legs to work the fluid back into your blood vessels. This may help the swelling go down.  Wear elastic bandages or support stockings to reduce ankle swelling as directed by your health care provider.  Eat a low-salt diet to reduce fluid if your health care provider recommends it.  Only take medicines as directed by your health care provider. Contact a health care provider if:  Your edema is not responding to treatment.  You have heart, liver, or kidney disease and notice symptoms of edema.  You have edema in your legs that does not improve after elevating them.  You have sudden and unexplained weight gain. Get help right away if:  You develop shortness of breath or chest pain.  You cannot breathe when you lie down.  You develop pain,  redness, or warmth in the swollen areas.  You have heart, liver, or kidney disease and suddenly get edema.  You have a fever and your symptoms suddenly get worse. This information is not intended to replace advice given to you by your health care provider. Make sure you discuss any questions you have with your health care provider. Document Released: 03/23/2005 Document Revised: 08/29/2015 Document Reviewed: 01/13/2013 Elsevier Interactive Patient Education  2017 [place] DASH stands for "Dietary Approaches to Stop Hypertension." The DASH eating plan is a healthy eating plan that has been shown to reduce high blood pressure (hypertension). It may also reduce your risk for type 2 diabetes, heart disease, and stroke. The DASH eating plan may also help with weight loss. What are tips for following this plan? General guidelines  Avoid eating more than 2,300 mg (milligrams) of salt (sodium) a day. If you have hypertension, you may need to reduce your sodium intake to 1,500 mg a day.  Limit alcohol intake to no more than 1 drink a day for nonpregnant women and 2 drinks a day for men. One drink equals 12 oz of beer, 5 oz of wine, or 1 oz of hard liquor.  Work with your health care provider to maintain a healthy body weight or to lose weight. Ask what an ideal weight is for you.  Get at least 30 minutes of exercise that causes your heart to beat faster (aerobic exercise) most days of the week. Activities may include walking, swimming, or biking.  Work with your health care provider or diet and nutrition specialist (dietitian) to adjust your eating plan to your individual calorie needs. Reading food labels  Check food labels for the amount of sodium per serving. Choose foods with less than 5 percent of the Daily Value of sodium. Generally, foods with less than 300 mg of sodium per serving fit into this eating plan.  To find whole grains, look for the word "whole" as the first word in the ingredient list. Shopping  Buy products labeled as "low-sodium" or "no salt added."  Buy fresh foods. Avoid canned foods and premade or frozen meals. Cooking  Avoid adding salt when cooking. Use salt-free seasonings or herbs instead of table salt or sea salt. Check with your health care provider or pharmacist before using salt substitutes.  Do not fry foods. Cook foods using healthy methods such as baking, boiling, grilling, and broiling instead.  Cook  with heart-healthy oils, such as olive, canola, soybean, or sunflower oil. Meal planning   Eat a balanced diet that includes: ? 5 or more servings of fruits and vegetables each day. At each meal, try to fill half of your plate with fruits and vegetables. ? Up to 6-8 servings of whole grains each day. ? Less than 6 oz of lean meat, poultry, or fish each day. A 3-oz serving of meat is about the same size as a deck of cards. One egg equals 1 oz. ? 2 servings of low-fat dairy each day. ? A serving of nuts, seeds, or beans 5 times each week. ? Heart-healthy fats. Healthy fats called Omega-3 fatty acids are found in foods such as flaxseeds and coldwater fish, like sardines, salmon, and mackerel.  Limit how much you eat of the following: ? Canned or prepackaged foods. ? Food that is high in trans fat, such as fried foods. ? Food that is high in saturated fat, such as fatty meat. ? Sweets, desserts, sugary drinks,  and other foods with added sugar. ? Full-fat dairy products.  Do not salt foods before eating.  Try to eat at least 2 vegetarian meals each week.  Eat more home-cooked food and less restaurant, buffet, and fast food.  When eating at a restaurant, ask that your food be prepared with less salt or no salt, if possible. What foods are recommended? The items listed may not be a complete list. Talk with your dietitian about what dietary choices are best for you. Grains Whole-grain or whole-wheat bread. Whole-grain or whole-wheat pasta. Brown rice. [place]. Bulgur. Whole-grain and low-sodium cereals. Pita bread. Low-fat, low-sodium crackers. Whole-wheat flour tortillas. Vegetables Fresh or frozen vegetables (raw, steamed, roasted, or grilled). Low-sodium or reduced-sodium tomato and vegetable juice. Low-sodium or reduced-sodium tomato sauce and tomato paste. Low-sodium or reduced-sodium canned vegetables. Fruits All fresh, dried, or frozen fruit. Canned fruit in natural juice  (without added sugar). Meat and other protein foods Skinless chicken or [place]. Ground chicken or [place]. Pork with fat trimmed off. Fish and seafood. Egg whites. Dried beans, peas, or lentils. Unsalted nuts, nut butters, and seeds. Unsalted canned beans. Lean cuts of beef with fat trimmed off. Low-sodium, lean deli meat. Dairy Low-fat (1%) or fat-free (skim) milk. Fat-free, low-fat, or reduced-fat cheeses. Nonfat, low-sodium ricotta or cottage cheese. Low-fat or nonfat yogurt. Low-fat, low-sodium cheese. Fats and oils Soft margarine without trans fats. Vegetable oil. Low-fat, reduced-fat, or light mayonnaise and salad dressings (reduced-sodium). Canola, safflower, olive, soybean, and sunflower oils. Avocado. Seasoning and other foods Herbs. Spices. Seasoning mixes without salt. Unsalted popcorn and pretzels. Fat-free sweets. What foods are not recommended? The items listed may not be a complete list. Talk with your dietitian about what dietary choices are best for you. Grains Baked goods made with fat, such as croissants, muffins, or some breads. Dry pasta or rice meal packs. Vegetables Creamed or fried vegetables. Vegetables in a cheese sauce. Regular canned vegetables (not low-sodium or reduced-sodium). Regular canned tomato sauce and paste (not low-sodium or reduced-sodium). Regular tomato and vegetable juice (not low-sodium or reduced-sodium). Angie Fava. Olives. Fruits Canned fruit in a light or heavy syrup. Fried fruit. Fruit in cream or butter sauce. Meat and other protein foods Fatty cuts of meat. Ribs. Fried meat. Berniece Salines. Sausage. Bologna and other processed lunch meats. Salami. Fatback. Hotdogs. Bratwurst. Salted nuts and seeds. Canned beans with added salt. Canned or smoked fish. Whole eggs or egg yolks. Chicken or [place] with skin. Dairy Whole or 2% milk, cream, and half-and-half. Whole or full-fat cream cheese. Whole-fat or sweetened yogurt. Full-fat cheese. Nondairy creamers. Whipped  toppings. Processed cheese and cheese spreads. Fats and oils Butter. Stick margarine. Lard. Shortening. Ghee. Bacon fat. Tropical oils, such as coconut, palm kernel, or palm oil. Seasoning and other foods Salted popcorn and pretzels. Onion salt, garlic salt, seasoned salt, table salt, and sea salt. Worcestershire sauce. Tartar sauce. Barbecue sauce. Teriyaki sauce. Soy sauce, including reduced-sodium. Steak sauce. Canned and packaged gravies. Fish sauce. Oyster sauce. Cocktail sauce. Horseradish that you find on the shelf. Ketchup. Mustard. Meat flavorings and tenderizers. Bouillon cubes. Hot sauce and Tabasco sauce. Premade or packaged marinades. Premade or packaged taco seasonings. Relishes. Regular salad dressings. Where to find more information:  National Heart, Lung, and [place]: https://wilson-eaton.com/  American Heart Association: www.heart.org Summary  The DASH eating plan is a healthy eating plan that has been shown to reduce high blood pressure (hypertension). It may also reduce your risk for type 2 diabetes, heart disease, and stroke.  With the DASH eating plan, you should limit salt (sodium) intake to 2,300 mg a day. If you have hypertension, you may need to reduce your sodium intake to 1,500 mg a day.  When on the DASH eating plan, aim to eat more fresh fruits and vegetables, whole grains, lean proteins, low-fat dairy, and heart-healthy fats.  Work with your health care provider or diet and nutrition specialist (dietitian) to adjust your eating plan to your individual calorie needs. This information is not intended to replace advice given to you by your health care provider. Make sure you discuss any questions you have with your health care provider. Document Released: 03/12/2011 Document Revised: 03/16/2016 Document Reviewed: 03/16/2016 Elsevier Interactive Patient Education  2018 [employer].  Hypertension Hypertension, commonly called high blood pressure, is when the force  of blood pumping through the arteries is too strong. The arteries are the blood vessels that carry blood from the heart throughout the body. Hypertension forces the heart to work harder to pump blood and may cause arteries to become narrow or stiff. Having untreated or uncontrolled hypertension can cause heart attacks, strokes, kidney disease, and other problems. A blood pressure reading consists of a higher number over a lower number. Ideally, your blood pressure should be below 120/80. The first ("top") number is called the systolic pressure. It is a measure of the pressure in your arteries as your heart beats. The second ("bottom") number is called the diastolic pressure. It is a measure of the pressure in your arteries as the heart relaxes. What are the causes? The cause of this condition is not known. What increases the risk? Some risk factors for high blood pressure are under your control. Others are not. Factors you can change  Smoking.  Having type 2 diabetes mellitus, high cholesterol, or both.  Not getting enough exercise or physical activity.  Being overweight.  Having too much fat, sugar, calories, or salt (sodium) in your diet.  Drinking too much alcohol. Factors that are difficult or impossible to change  Having chronic kidney disease.  Having a family history of high blood pressure.  Age. Risk increases with age.  Race. You may be at higher risk if you are African-American.  Gender. Men are at higher risk than women before age 60. After age 21, women are at higher risk than men.  Having obstructive sleep apnea.  Stress. What are the signs or symptoms? Extremely high blood pressure (hypertensive crisis) may cause:  Headache.  Anxiety.  Shortness of breath.  Nosebleed.  Nausea and vomiting.  Severe chest pain.  Jerky movements you cannot control (seizures).  How is this diagnosed? This condition is diagnosed by measuring your blood pressure while you  are seated, with your arm resting on a surface. The cuff of the blood pressure monitor will be placed directly against the skin of your upper arm at the level of your heart. It should be measured at least twice using the same arm. Certain conditions can cause a difference in blood pressure between your right and left arms. Certain factors can cause blood pressure readings to be lower or higher than normal (elevated) for a short period of time:  When your blood pressure is higher when you are in a health care provider's office than when you are at home, this is called white coat hypertension. Most people with this condition do not need medicines.  When your blood pressure is higher at home than when you are in a health care provider's office,  this is called masked hypertension. Most people with this condition may need medicines to control blood pressure.  If you have a high blood pressure reading during one visit or you have normal blood pressure with other risk factors:  You may be asked to return on a different day to have your blood pressure checked again.  You may be asked to monitor your blood pressure at home for 1 week or longer.  If you are diagnosed with hypertension, you may have other blood or imaging tests to help your health care provider understand your overall risk for other conditions. How is this treated? This condition is treated by making healthy lifestyle changes, such as eating healthy foods, exercising more, and reducing your alcohol intake. Your health care provider may prescribe medicine if lifestyle changes are not enough to get your blood pressure under control, and if:  Your systolic blood pressure is above 130.  Your diastolic blood pressure is above 80.  Your personal target blood pressure may vary depending on your medical conditions, your age, and other factors. Follow these instructions at home: Eating and drinking  Eat a diet that is high in fiber and  potassium, and low in sodium, added sugar, and fat. An example eating plan is called the DASH (Dietary Approaches to Stop Hypertension) diet. To eat this way: ? Eat plenty of fresh fruits and vegetables. Try to fill half of your plate at each meal with fruits and vegetables. ? Eat whole grains, such as whole wheat pasta, brown rice, or whole grain bread. Fill about one quarter of your plate with whole grains. ? Eat or drink low-fat dairy products, such as skim milk or low-fat yogurt. ? Avoid fatty cuts of meat, processed or cured meats, and poultry with skin. Fill about one quarter of your plate with lean proteins, such as fish, chicken without skin, beans, eggs, and tofu. ? Avoid premade and processed foods. These tend to be higher in sodium, added sugar, and fat.  Reduce your daily sodium intake. Most people with hypertension should eat less than 1,500 mg of sodium a day.  Limit alcohol intake to no more than 1 drink a day for nonpregnant women and 2 drinks a day for men. One drink equals 12 oz of beer, 5 oz of wine, or 1 oz of hard liquor. Lifestyle  Work with your health care provider to maintain a healthy body weight or to lose weight. Ask what an ideal weight is for you.  Get at least 30 minutes of exercise that causes your heart to beat faster (aerobic exercise) most days of the week. Activities may include walking, swimming, or biking.  Include exercise to strengthen your muscles (resistance exercise), such as pilates or lifting weights, as part of your weekly exercise routine. Try to do these types of exercises for 30 minutes at least 3 days a week.  Do not use any products that contain nicotine or tobacco, such as cigarettes and e-cigarettes. If you need help quitting, ask your health care provider.  Monitor your blood pressure at home as told by your health care provider.  Keep all follow-up visits as told by your health care provider. This is important. Medicines  Take  over-the-counter and prescription medicines only as told by your health care provider. Follow directions carefully. Blood pressure medicines must be taken as prescribed.  Do not skip doses of blood pressure medicine. Doing this puts you at risk for problems and can make the medicine less effective.  Ask your health care provider about side effects or reactions to medicines that you should watch for. Contact a health care provider if:  You think you are having a reaction to a medicine you are taking.  You have headaches that keep coming back (recurring).  You feel dizzy.  You have swelling in your ankles.  You have trouble with your vision. Get help right away if:  You develop a severe headache or confusion.  You have unusual weakness or numbness.  You feel faint.  You have severe pain in your chest or abdomen.  You vomit repeatedly.  You have trouble breathing. Summary  Hypertension is when the force of blood pumping through your arteries is too strong. If this condition is not controlled, it may put you at risk for serious complications.  Your personal target blood pressure may vary depending on your medical conditions, your age, and other factors. For most people, a normal blood pressure is less than 120/80.  Hypertension is treated with lifestyle changes, medicines, or a combination of both. Lifestyle changes include weight loss, eating a healthy, low-sodium diet, exercising more, and limiting alcohol. This information is not intended to replace advice given to you by your health care provider. Make sure you discuss any questions you have with your health care provider. Document Released: 03/23/2005 Document Revised: 02/19/2016 Document Reviewed: 02/19/2016 Elsevier Interactive Patient Education  [employer].

## 2017-08-19 NOTE — Addendum Note (Signed)
Addended by: Johna Sheriff on: 08/19/2017 11:59 AM   Modules accepted: Orders

## 2017-08-19 NOTE — Progress Notes (Signed)
Chief Complaint  Patient presents with  . Follow-up   F/u  HTN still sl elevated today on telmisartan 80, chlorthalidione 12.5, Coreg 25 mg bid   Leg edema improved with unna boot and on today   Reviewed labs AST elevated no etoh will do US abdomen   Review of Systems  Constitutional: Negative for weight loss.  HENT: Negative for hearing loss.   Eyes: Negative for blurred vision.  Respiratory: Negative for shortness of breath.   Cardiovascular: Negative for chest pain.       +leg edema improved  Musculoskeletal: Negative for falls.  Skin: Negative for rash.  Neurological: Negative for headaches.  Psychiatric/Behavioral: Negative for depression.   Past Medical History:  Diagnosis Date  . Diabetes mellitus without complication ([place])   . Hyperlipidemia   . Hypertension    Past Surgical History:  Procedure Laterality Date  . APPENDECTOMY     1985  . BRAIN SURGERY  1961   removed bone from inside skull that was pressing on optic nerve  . HERNIA REPAIR     umbilical 9937   . TOE AMPUTATION     right great toe 2016   Family History  Problem Relation Age of Onset  . Colon cancer Brother   . Cancer Brother        colon   . Stroke Brother   . Heart disease Daughter        MI   Social History   Socioeconomic History  . Marital status: Widowed    Spouse name: Not on file  . Number of children: 3  . Years of education: Not on file  . Highest education level: Not on file  Occupational History  . Not on file  Social Needs  . Financial resource strain: Not on file  . Food insecurity:    Worry: Not on file    Inability: Not on file  . Transportation needs:    Medical: Not on file    Non-medical: Not on file  Tobacco Use  . Smoking status: Former Smoker    Last attempt to quit: 1961    Years since quitting: 58.4  . Smokeless tobacco: Never Used  Substance and Sexual Activity  . Alcohol use: No  . Drug use: Never  . Sexual activity: Not Currently  Lifestyle   . Physical activity:    Days per week: Not on file    Minutes per session: Not on file  . Stress: Not on file  Relationships  . Social connections:    Talks on phone: Not on file    Gets together: Not on file    Attends religious service: Not on file    Active member of club or organization: Not on file    Attends meetings of clubs or organizations: Not on file    Relationship status: Not on file  . Intimate partner violence:    Fear of current or ex partner: Not on file    Emotionally abused: Not on file    Physically abused: Not on file    Forced sexual activity: Not on file  Other Topics Concern  . Not on file  Social History Narrative   3 kids    Niece Peggyann [place] [phone] involved in care    Owns guns, wears seat belts, safe in relationship   Retired, widower   Some high school ed    Likes car detailing    Current Meds  Medication Sig  . acetaminophen (  TYLENOL) 500 MG tablet Take 1,000 mg by mouth 2 (two) times daily.  Marland Kitchen aspirin EC 81 MG tablet Take 81 mg by mouth daily.  . carvedilol (COREG) 25 MG tablet Take 1 tablet by mouth 2 (two) times daily.  . chlorthalidone (HYGROTON) 25 MG tablet Take 1 tablet (25 mg total) by mouth daily.  . metFORMIN (GLUCOPHAGE-XR) 500 MG 24 hr tablet Take 1 tablet by mouth daily.  . Omega-3 Fatty Acids (FISH OIL) 1000 MG CAPS Take 1 capsule by mouth 2 (two) times daily.  Marland Kitchen telmisartan (MICARDIS) 80 MG tablet Take 1 tablet (80 mg total) by mouth daily.  . traMADol (ULTRAM) 50 MG tablet Take 1 tablet (50 mg total) by mouth 2 (two) times daily as needed.  . tuberculin (APLISOL) 5 UNIT/0.1ML injection Inject into the skin.  . [DISCONTINUED] chlorthalidone (HYGROTON) 25 MG tablet Take 0.5 tablets (12.5 mg total) by mouth daily.   No Known Allergies Recent Results (from the past 2160 hour(s))  HM DIABETES EYE EXAM     Status: None   Collection Time: 07/08/17 12:00 AM  Result Value Ref Range   HM Diabetic Eye Exam No Retinopathy No  Retinopathy    Comment: Dr. Gertie Baron 07/08/17 neg DM retinopathy and edema neg   Comprehensive metabolic panel     Status: Abnormal   Collection Time: 08/05/17  4:01 PM  Result Value Ref Range   Sodium 140 135 - 145 mEq/L   Potassium 3.8 3.5 - 5.1 mEq/L   Chloride 101 96 - 112 mEq/L   CO2 31 19 - 32 mEq/L   Glucose, Bld 95 70 - 99 mg/dL   BUN 15 6 - 23 mg/dL   Creatinine, Ser 1.18 0.40 - 1.50 mg/dL   Total Bilirubin 0.6 0.2 - 1.2 mg/dL   Alkaline Phosphatase 53 39 - 117 U/L   AST 44 (H) 0 - 37 U/L   ALT 23 0 - 53 U/L   Total Protein 7.9 6.0 - 8.3 g/dL   Albumin 4.1 3.5 - 5.2 g/dL   Calcium 9.8 8.4 - 10.5 mg/dL   GFR 74.98 >60.00 mL/min  CBC w/Diff     Status: Abnormal   Collection Time: 08/05/17  4:01 PM  Result Value Ref Range   WBC 4.4 4.0 - 10.5 K/uL   RBC 5.20 4.22 - 5.81 Mil/uL   Hemoglobin 13.1 13.0 - 17.0 g/dL   HCT 40.5 39.0 - 52.0 %   MCV 77.9 (L) 78.0 - 100.0 fl   MCHC 32.3 30.0 - 36.0 g/dL   RDW 16.0 (H) 11.5 - 15.5 %   Platelets 236.0 150.0 - 400.0 K/uL   Neutrophils Relative % 46.0 43.0 - 77.0 %   Lymphocytes Relative 42.0 12.0 - 46.0 %   Monocytes Relative 9.0 3.0 - 12.0 %   Eosinophils Relative 1.2 0.0 - 5.0 %   Basophils Relative 1.8 0.0 - 3.0 %   Neutro Abs 2.0 1.4 - 7.7 K/uL   Lymphs Abs 1.8 0.7 - 4.0 K/uL   Monocytes Absolute 0.4 0.1 - 1.0 K/uL   Eosinophils Absolute 0.1 0.0 - 0.7 K/uL   Basophils Absolute 0.1 0.0 - 0.1 K/uL  Hemoglobin A1c     Status: None   Collection Time: 08/05/17  4:01 PM  Result Value Ref Range   Hgb A1c MFr Bld 6.4 4.6 - 6.5 %    Comment: Glycemic Control Guidelines for People with Diabetes:Non Diabetic:  <6%Goal of Therapy: <7%Additional Action Suggested:  >8%  TSH     Status: None   Collection Time: 08/05/17  4:01 PM  Result Value Ref Range   TSH 2.03 0.35 - 4.50 uIU/mL  T4, free     Status: None   Collection Time: 08/05/17  4:01 PM  Result Value Ref Range   Free T4 0.75 0.60 - 1.60 ng/dL    Comment: Specimens from  patients who are undergoing biotin therapy and /or ingesting biotin supplements may contain high levels of biotin.  The higher biotin concentration in these specimens interferes with this Free T4 assay.  Specimens that contain high levels  of biotin may cause false high results for this Free T4 assay.  Please interpret results in light of the total clinical presentation of the patient.    Urinalysis, Routine w reflex microscopic     Status: None   Collection Time: 08/05/17  4:01 PM  Result Value Ref Range   Specific Gravity, UA 1.014 1.005 - 1.030   pH, UA 7.5 5.0 - 7.5   Color, UA Yellow Yellow   Appearance Ur Clear Clear   Leukocytes, UA Negative Negative   Protein, UA Trace Negative/Trace   Glucose, UA Negative Negative   Ketones, UA Negative Negative   RBC, UA Negative Negative   Bilirubin, UA Negative Negative   Urobilinogen, Ur 0.2 0.2 - 1.0 mg/dL   Nitrite, UA Negative Negative   Microscopic Examination Comment     Comment: Microscopic not indicated and not performed.  Urine Microalbumin w/creat. ratio     Status: Abnormal   Collection Time: 08/05/17  4:01 PM  Result Value Ref Range   Microalb, Ur 8.9 (H) 0.0 - 1.9 mg/dL   Creatinine,U 71.3 mg/dL   Microalb Creat Ratio 12.5 0.0 - 30.0 mg/g  Iron, TIBC and Ferritin Panel     Status: None   Collection Time: 08/05/17  4:01 PM  Result Value Ref Range   Iron 85 50 - 180 mcg/dL   TIBC 322 250 - 425 mcg/dL (calc)   %SAT 26 15 - 60 % (calc)   Ferritin 57 20 - 380 ng/mL  Vitamin D (25 hydroxy)     Status: None   Collection Time: 08/05/17  4:01 PM  Result Value Ref Range   VITD 38.50 30.00 - 100.00 ng/mL   Objective  Body mass index is 27.1 kg/m. Wt Readings from Last 3 Encounters:  08/19/17 205 lb 6 oz (93.2 kg)  08/17/17 204 lb (92.5 kg)  08/05/17 208 lb (94.3 kg)   Temp Readings from Last 3 Encounters:  08/19/17 98.3 F (36.8 C) (Oral)  08/05/17 98.1 F (36.7 C) (Oral)  07/08/17 98.4 F (36.9 C) (Oral)   BP  Readings from Last 3 Encounters:  08/19/17 (!) 160/84  08/17/17 (!) 216/86  08/05/17 (!) 160/90   Pulse Readings from Last 3 Encounters:  08/19/17 66  08/17/17 63  08/05/17 84    Physical Exam  Constitutional: He is oriented to person, place, and time. Vital signs are normal. He appears well-developed and well-nourished. He is cooperative.  HENT:  Head: Normocephalic and atraumatic.  Mouth/Throat: Oropharynx is clear and moist and mucous membranes are normal.  Eyes: Pupils are equal, round, and reactive to light. Conjunctivae are normal.  Cardiovascular: Normal rate and regular rhythm.  Murmur heard. Pulmonary/Chest: Effort normal and breath sounds normal.  Neurological: He is alert and oriented to person, place, and time. Gait normal.  Skin: Skin is warm, dry and intact.  Psychiatric: He has a normal  mood and affect. His speech is normal and behavior is normal. Judgment and thought content normal. Cognition and memory are normal.  Nursing note and vitals reviewed.   Assessment   1. HTN  2. Abnormal lfts  3. B/l leg edema  4. HM Plan  1.  Coreg 25 mg bid, chlorthalidone 12.5 mg qd, telmisartan 80 mg qd  Check lipid today  BMET today  2. Check hep B/C status today  [place] ab 3. Cont unna boot  F/u Dr. Lucky Cowboy  Pending venous and arterial US to w/u  4.  Never had flu shot  Tdap had 05/07/14 pna 23 due at f/u given today  prevnar had 05/07/14  Zoster had 05/07/14 consider shingrix in future   Never had colonoscopy  Out of age window PSA Pending echo w/u cardiac murmur   Provider: Dr. Olivia Mackie McLean-Scocuzza-[facility]

## 2017-08-20 ENCOUNTER — Ambulatory Visit: Payer: Medicare Other

## 2017-08-20 LAB — HEPATITIS C ANTIBODY
HEP C AB: NONREACTIVE
SIGNAL TO CUT-OFF: 0.04 (ref <1.00)

## 2017-08-20 LAB — HEPATITIS B SURFACE ANTIGEN: Hepatitis B Surface Ag: NONREACTIVE

## 2017-08-20 LAB — HEPATITIS B SURFACE ANTIBODY, QUANTITATIVE

## 2017-08-23 ENCOUNTER — Ambulatory Visit
Admission: RE | Admit: 2017-08-23 | Discharge: 2017-08-23 | Disposition: A | Discharge status: Home / Self Care | Payer: Medicare Other | Source: Ambulatory Visit | Attending: Internal Medicine | Admitting: Internal Medicine

## 2017-08-23 DIAGNOSIS — R011 Cardiac murmur, unspecified: Secondary | ICD-10-CM | POA: Diagnosis not present

## 2017-08-23 DIAGNOSIS — R748 Abnormal levels of other serum enzymes: Secondary | ICD-10-CM | POA: Insufficient documentation

## 2017-08-23 DIAGNOSIS — E119 Type 2 diabetes mellitus without complications: Secondary | ICD-10-CM | POA: Diagnosis not present

## 2017-08-23 DIAGNOSIS — I082 Rheumatic disorders of both aortic and tricuspid valves: Secondary | ICD-10-CM | POA: Diagnosis not present

## 2017-08-23 DIAGNOSIS — E785 Hyperlipidemia, unspecified: Secondary | ICD-10-CM | POA: Insufficient documentation

## 2017-08-23 DIAGNOSIS — R935 Abnormal findings on diagnostic imaging of other abdominal regions, including retroperitoneum: Secondary | ICD-10-CM | POA: Diagnosis not present

## 2017-08-23 DIAGNOSIS — I10 Essential (primary) hypertension: Secondary | ICD-10-CM | POA: Insufficient documentation

## 2017-08-23 DIAGNOSIS — R600 Localized edema: Secondary | ICD-10-CM | POA: Diagnosis not present

## 2017-08-23 NOTE — Progress Notes (Signed)
*  PRELIMINARY RESULTS* Echocardiogram 2D Echocardiogram has been performed.  Sherrie Sport 08/23/2017, 11:56 AM

## 2017-08-24 ENCOUNTER — Telehealth (INDEPENDENT_AMBULATORY_CARE_PROVIDER_SITE_OTHER): Payer: Self-pay

## 2017-08-24 ENCOUNTER — Ambulatory Visit: Payer: Medicare Other

## 2017-08-24 ENCOUNTER — Telehealth: Payer: Self-pay | Admitting: Internal Medicine

## 2017-08-24 NOTE — Telephone Encounter (Signed)
Copied from [name] [phone]. Topic: Inquiry >> Aug 24, 2017  2:19 PM Lucas Jackson wrote: Reason for CRM: [facility] called to let pcp know that the appt for the artery's were not performed Jackson/c pt did not show up, pt is needing to be rescheduled for the arterial duplex,  call pt if needed

## 2017-08-24 NOTE — Telephone Encounter (Signed)
Colletta Maryland from [facility] [phone]) called stating they will not me able to do unna wraps on the patient because he has no open wounds.I called the patient and he wanted me to call his niece to schedule his appointment to come in the office for unna wraps.

## 2017-08-24 NOTE — Telephone Encounter (Signed)
Please resch   tMS

## 2017-08-25 ENCOUNTER — Telehealth (INDEPENDENT_AMBULATORY_CARE_PROVIDER_SITE_OTHER): Payer: Self-pay

## 2017-08-25 NOTE — Telephone Encounter (Signed)
I spoke with patient niece Lucas Jackson) and inform her with the information Maudie Mercury advise and at this time she will be coming by  the office to pick his prescription of compression stockings for Lucas Jackson

## 2017-08-25 NOTE — Telephone Encounter (Signed)
I recommend that the patient undergo weekly unna wraps performed at our office however if transportation is an issue and he is unable to receive visiting nurse services I strongly recommend that he transition to compression socks.  The patient or a representative will need to come to our office to pick up a prescription.  The patient will then need to take the prescription to a medical supply store and be measured / fitted for the appropriate size socks.  If the patient nor a representative can travel to the office to pick up the prescription we will be happy to mail the patient one. Compression socks and elevation instructions are as follows:  The patient was encouraged to wear graduated compression stockings (20-30 mmHg) on a daily basis. The patient was instructed to begin wearing the stockings first thing in the morning and removing them in the evening. The patient was instructed specifically not to sleep in the stockings.  In addition, behavioral modification including elevation during the day will be initiated. The patient was instructed to call the office in the interim if any worsening edema or ulcerations to the legs, feet or toes occurs. The patient should follow-up for his originally scheduled follow-up to assess his progress with compression socks.

## 2017-08-28 ENCOUNTER — Other Ambulatory Visit: Payer: Self-pay | Admitting: Internal Medicine

## 2017-08-28 DIAGNOSIS — I38 Endocarditis, valve unspecified: Secondary | ICD-10-CM

## 2017-08-28 DIAGNOSIS — R011 Cardiac murmur, unspecified: Secondary | ICD-10-CM

## 2017-08-28 DIAGNOSIS — I272 Pulmonary hypertension, unspecified: Secondary | ICD-10-CM

## 2017-09-14 ENCOUNTER — Ambulatory Visit (INDEPENDENT_AMBULATORY_CARE_PROVIDER_SITE_OTHER): Payer: Medicare Other | Admitting: Vascular Surgery

## 2017-09-27 ENCOUNTER — Other Ambulatory Visit: Payer: Self-pay | Admitting: Internal Medicine

## 2017-09-27 DIAGNOSIS — E119 Type 2 diabetes mellitus without complications: Secondary | ICD-10-CM

## 2017-09-27 DIAGNOSIS — I10 Essential (primary) hypertension: Secondary | ICD-10-CM

## 2017-09-27 MED ORDER — METFORMIN HCL ER 500 MG PO TB24: 500.0000 mg | ORAL_TABLET | Freq: Every day | ORAL | 3 refills | Status: DC

## 2017-09-27 MED ORDER — CARVEDILOL 25 MG PO TABS: 25.0000 mg | ORAL_TABLET | Freq: Two times a day (BID) | ORAL | 3 refills | Status: DC

## 2017-10-05 ENCOUNTER — Encounter: Payer: Self-pay | Admitting: Internal Medicine

## 2017-10-05 ENCOUNTER — Ambulatory Visit (INDEPENDENT_AMBULATORY_CARE_PROVIDER_SITE_OTHER): Payer: Medicare Other | Admitting: Internal Medicine

## 2017-10-05 VITALS — BP 144/70 | HR 88 | Temp 98.1°F | Ht 71.0 in | Wt 204.1 lb

## 2017-10-05 DIAGNOSIS — I350 Nonrheumatic aortic (valve) stenosis: Secondary | ICD-10-CM

## 2017-10-05 DIAGNOSIS — E119 Type 2 diabetes mellitus without complications: Secondary | ICD-10-CM

## 2017-10-05 DIAGNOSIS — R600 Localized edema: Secondary | ICD-10-CM | POA: Diagnosis not present

## 2017-10-05 DIAGNOSIS — R011 Cardiac murmur, unspecified: Secondary | ICD-10-CM | POA: Diagnosis not present

## 2017-10-05 DIAGNOSIS — I10 Essential (primary) hypertension: Secondary | ICD-10-CM

## 2017-10-05 DIAGNOSIS — I272 Pulmonary hypertension, unspecified: Secondary | ICD-10-CM

## 2017-10-05 DIAGNOSIS — K760 Fatty (change of) liver, not elsewhere classified: Secondary | ICD-10-CM | POA: Diagnosis not present

## 2017-10-05 DIAGNOSIS — I351 Nonrheumatic aortic (valve) insufficiency: Secondary | ICD-10-CM

## 2017-10-05 NOTE — Patient Instructions (Addendum)
zevia soda or diet or low sugar for sodas  Premier shake or glucerna shake F/u in 4 months     Fatty Liver Fatty liver, also called hepatic steatosis or steatohepatitis, is a condition in which too much fat has built up in your liver cells. The liver removes harmful substances from your bloodstream. It produces fluids your body needs. It also helps your body use and store energy from the food you eat. In many cases, fatty liver does not cause symptoms or problems. It is often diagnosed when tests are being done for other reasons. However, over time, fatty liver can cause inflammation that may lead to more serious liver problems, such as scarring of the liver (cirrhosis). What are the causes? Causes of fatty liver may include:  Drinking too much alcohol.  Poor nutrition.  Obesity.  Cushing syndrome.  Diabetes.  Hyperlipidemia.  Pregnancy.  Certain drugs.  Poisons.  Some viral infections.  What increases the risk? You may be more likely to develop fatty liver if you:  Abuse alcohol.  Are pregnant.  Are overweight.  Have diabetes.  Have hepatitis.  Have a high triglyceride level.  What are the signs or symptoms? Fatty liver often does not cause any symptoms. In cases where symptoms develop, they can include:  Fatigue.  Weakness.  Weight loss.  Confusion.  Abdominal pain.  Yellowing of your skin and the white parts of your eyes (jaundice).  Nausea and vomiting.  How is this diagnosed? Fatty liver may be diagnosed by:  Physical exam and medical history.  Blood tests.  Imaging tests, such as an ultrasound, CT scan, or MRI.  Liver biopsy. A small sample of liver tissue is removed using a needle. The sample is then looked at under a microscope.  How is this treated? Fatty liver is often caused by other health conditions. Treatment for fatty liver may involve medicines and lifestyle changes to manage conditions such as:  Alcoholism.  High  cholesterol.  Diabetes.  Being overweight or obese.  Follow these instructions at home:  Eat a healthy diet as directed by your health care provider.  Exercise regularly. This can help you lose weight and control your cholesterol and diabetes. Talk to your health care provider about an exercise plan and which activities are best for you.  Do not drink alcohol.  Take medicines only as directed by your health care provider. Contact a health care provider if: You have difficulty controlling your:  Blood sugar.  Cholesterol.  Alcohol consumption.  Get help right away if:  You have abdominal pain.  You have jaundice.  You have nausea and vomiting. This information is not intended to replace advice given to you by your health care provider. Make sure you discuss any questions you have with your health care provider. Document Released: 05/08/2005 Document Revised: 08/29/2015 Document Reviewed: 08/02/2013 Elsevier Interactive Patient Education  2018 [employer].  Hypertension Hypertension, commonly called high blood pressure, is when the force of blood pumping through the arteries is too strong. The arteries are the blood vessels that carry blood from the heart throughout the body. Hypertension forces the heart to work harder to pump blood and may cause arteries to become narrow or stiff. Having untreated or uncontrolled hypertension can cause heart attacks, strokes, kidney disease, and other problems. A blood pressure reading consists of a higher number over a lower number. Ideally, your blood pressure should be below 120/80. The first ("top") number is called the systolic pressure. It is a  measure of the pressure in your arteries as your heart beats. The second ("bottom") number is called the diastolic pressure. It is a measure of the pressure in your arteries as the heart relaxes. What are the causes? The cause of this condition is not known. What increases the risk? Some risk  factors for high blood pressure are under your control. Others are not. Factors you can change  Smoking.  Having type 2 diabetes mellitus, high cholesterol, or both.  Not getting enough exercise or physical activity.  Being overweight.  Having too much fat, sugar, calories, or salt (sodium) in your diet.  Drinking too much alcohol. Factors that are difficult or impossible to change  Having chronic kidney disease.  Having a family history of high blood pressure.  Age. Risk increases with age.  Race. You may be at higher risk if you are African-American.  Gender. Men are at higher risk than women before age 78. After age 20, women are at higher risk than men.  Having obstructive sleep apnea.  Stress. What are the signs or symptoms? Extremely high blood pressure (hypertensive crisis) may cause:  Headache.  Anxiety.  Shortness of breath.  Nosebleed.  Nausea and vomiting.  Severe chest pain.  Jerky movements you cannot control (seizures).  How is this diagnosed? This condition is diagnosed by measuring your blood pressure while you are seated, with your arm resting on a surface. The cuff of the blood pressure monitor will be placed directly against the skin of your upper arm at the level of your heart. It should be measured at least twice using the same arm. Certain conditions can cause a difference in blood pressure between your right and left arms. Certain factors can cause blood pressure readings to be lower or higher than normal (elevated) for a short period of time:  When your blood pressure is higher when you are in a health care provider's office than when you are at home, this is called white coat hypertension. Most people with this condition do not need medicines.  When your blood pressure is higher at home than when you are in a health care provider's office, this is called masked hypertension. Most people with this condition may need medicines to control  blood pressure.  If you have a high blood pressure reading during one visit or you have normal blood pressure with other risk factors:  You may be asked to return on a different day to have your blood pressure checked again.  You may be asked to monitor your blood pressure at home for 1 week or longer.  If you are diagnosed with hypertension, you may have other blood or imaging tests to help your health care provider understand your overall risk for other conditions. How is this treated? This condition is treated by making healthy lifestyle changes, such as eating healthy foods, exercising more, and reducing your alcohol intake. Your health care provider may prescribe medicine if lifestyle changes are not enough to get your blood pressure under control, and if:  Your systolic blood pressure is above 130.  Your diastolic blood pressure is above 80.  Your personal target blood pressure may vary depending on your medical conditions, your age, and other factors. Follow these instructions at home: Eating and drinking  Eat a diet that is high in fiber and potassium, and low in sodium, added sugar, and fat. An example eating plan is called the DASH (Dietary Approaches to Stop Hypertension) diet. To eat this way: ?  Eat plenty of fresh fruits and vegetables. Try to fill half of your plate at each meal with fruits and vegetables. ? Eat whole grains, such as whole wheat pasta, brown rice, or whole grain bread. Fill about one quarter of your plate with whole grains. ? Eat or drink low-fat dairy products, such as skim milk or low-fat yogurt. ? Avoid fatty cuts of meat, processed or cured meats, and poultry with skin. Fill about one quarter of your plate with lean proteins, such as fish, chicken without skin, beans, eggs, and tofu. ? Avoid premade and processed foods. These tend to be higher in sodium, added sugar, and fat.  Reduce your daily sodium intake. Most people with hypertension should eat less  than 1,500 mg of sodium a day.  Limit alcohol intake to no more than 1 drink a day for nonpregnant women and 2 drinks a day for men. One drink equals 12 oz of beer, 5 oz of wine, or 1 oz of hard liquor. Lifestyle  Work with your health care provider to maintain a healthy body weight or to lose weight. Ask what an ideal weight is for you.  Get at least 30 minutes of exercise that causes your heart to beat faster (aerobic exercise) most days of the week. Activities may include walking, swimming, or biking.  Include exercise to strengthen your muscles (resistance exercise), such as pilates or lifting weights, as part of your weekly exercise routine. Try to do these types of exercises for 30 minutes at least 3 days a week.  Do not use any products that contain nicotine or tobacco, such as cigarettes and e-cigarettes. If you need help quitting, ask your health care provider.  Monitor your blood pressure at home as told by your health care provider.  Keep all follow-up visits as told by your health care provider. This is important. Medicines  Take over-the-counter and prescription medicines only as told by your health care provider. Follow directions carefully. Blood pressure medicines must be taken as prescribed.  Do not skip doses of blood pressure medicine. Doing this puts you at risk for problems and can make the medicine less effective.  Ask your health care provider about side effects or reactions to medicines that you should watch for. Contact a health care provider if:  You think you are having a reaction to a medicine you are taking.  You have headaches that keep coming back (recurring).  You feel dizzy.  You have swelling in your ankles.  You have trouble with your vision. Get help right away if:  You develop a severe headache or confusion.  You have unusual weakness or numbness.  You feel faint.  You have severe pain in your chest or abdomen.  You vomit  repeatedly.  You have trouble breathing. Summary  Hypertension is when the force of blood pumping through your arteries is too strong. If this condition is not controlled, it may put you at risk for serious complications.  Your personal target blood pressure may vary depending on your medical conditions, your age, and other factors. For most people, a normal blood pressure is less than 120/80.  Hypertension is treated with lifestyle changes, medicines, or a combination of both. Lifestyle changes include weight loss, eating a healthy, low-sodium diet, exercising more, and limiting alcohol. This information is not intended to replace advice given to you by your health care provider. Make sure you discuss any questions you have with your health care provider. Document Released: 03/23/2005 Document Revised:  02/19/2016 Document Reviewed: 02/19/2016 Elsevier Interactive Patient Education  2018 [place].   Aortic Valve Stenosis Aortic valve stenosis is a narrowing of the aortic valve. The aortic valve opens and closes to regulate blood flow between the lower left chamber of the heart (left ventricle) and the blood vessel that leads away from the heart (aorta). When the aortic valve becomes narrow, it makes it difficult for the heart to pump blood into the aorta, which causes the heart to work harder. The extra work can weaken the heart over time. Aortic valve stenosis can range from mild to severe. If untreated, it can become more severe over time and can lead to heart failure. What are the causes? This condition may be caused by:  Buildup of calcium around and on the valve. This can occur with aging. This is the most common cause of aortic valve stenosis.  Birth defect.  Rheumatic fever.  Radiation to the chest.  What increases the risk? You may be more likely to develop this condition if:  You are over the age of 51.  You were born with an abnormal bicuspid valve.  What are the  signs or symptoms? You may have no symptoms until your condition becomes severe. It may take 10-20 years for mild or moderate aortic valve stenosis to become severe. Symptoms may include:  Shortness of breath. This may get worse during physical activity.  Feeling unusually weak and tired (fatigue).  Extreme discomfort in the chest, neck, or arm (angina).  A heartbeat that is irregular or faster than normal (palpitations).  Dizziness or fainting. This may happen when you get physically tired or after you take certain heart medicines, such as nitroglycerin.  How is this diagnosed? This condition may be diagnosed with:  A physical exam.  Echocardiogram. This is a type of imaging test that uses sound waves (ultrasound) to make an image of your heart. There are two types that may be used: ? Transthoracic echocardiogram (TTE). This type of echocardiogram is noninvasive, and it is usually done first. ? Transesophageal echocardiogram (TEE). This type of echocardiogram is done by passing a flexible tube down your esophagus. The heart and the esophagus are close to each other, so your health care provider can take very clear, detailed pictures of the heart using this type of test.  Cardiac catheterization. In this procedure, a thin, flexible tube (catheter) is passed through a large vein in your neck, groin, or arm. This procedure provides information about arteries, structures, blood pressure, and oxygen levels in your heart.  Electrocardiogram (ECG). This records the electrical impulses of your heart and assesses heart function.  Stress tests. These are tests that evaluate the blood supply to your heart and your heart's response to exercise.  Blood tests.  You may work with a health care provider who specializes in the heart (cardiologist). How is this treated? Treatment depends on how severe your condition is and what your symptoms are. You will need to have your heart checked regularly to  make sure that your condition is not getting worse or causing serious problems. If your condition is mild, no treatment may be needed. Treatment may include:  Medicines that help keep your heart rate regular.  Medicines that thin your blood (anticoagulants) to prevent the formation of blood clots.  Antibiotic medicines to help prevent infection.  Surgery to replace your aortic valve. This is the most common treatment for aortic valve stenosis. Several types of surgeries are available. The surgery may be  done through a large incision over your heart (open heart surgery), or it may be done using a minimally invasive technique (transcatheter aortic valve replacement, or TAVR).  Follow these instructions at home: Lifestyle   Limit alcohol intake to no more than 1 drink per day for nonpregnant women and 2 drinks per day for men. One drink equals 12 oz of beer, 5 oz of wine, or 1 oz of hard liquor.  Do not use any tobacco products, such as cigarettes, chewing tobacco, or e-cigarettes. If you need help quitting, ask your health care provider.  Work with your health care provider to manage your blood pressure and cholesterol.  Maintain a healthy weight. Eating and drinking  Follow instructions from your health care provider about eating or drinking restrictions. ? Limit how much caffeine you drink. Caffeine can affect your heart's rate and rhythm.  Drink enough fluid to keep your urine clear or pale yellow.  Eat a heart-healthy diet. This should include plenty of fresh fruits and vegetables. If you eat meat, it should be lean cuts. Avoid foods that are: ? High in salt, saturated fat, or sugar. ? Canned or highly processed. ? Fried. Activity  Return to your normal activities as told by your health care provider. Ask your health care provider what activities are safe for you.  Exercise regularly, as told by your health care provider. Ask your health care provider what types of exercise  are safe for you.  If your aortic valve stenosis is mild, you may need to avoid only very intense physical activity. The more severe your aortic valve stenosis is, the more activities you may need to avoid. General instructions  Take over-the-counter and prescription medicines only as told by your health care provider.  If you are a woman and you plan to become pregnant, talk with your health care provider before you become pregnant.  Tell all health care providers who care for you that you have aortic valve stenosis.  Keep all follow-up visits as told by your health care provider. This is important. Contact a health care provider if:  You have a fever. Get help right away if:  You develop chest pain or tightness.  You develop shortness of breath or difficulty breathing.  You feel light-headed.  You feel like you might faint.  Your heartbeat is irregular or faster than normal. These symptoms may represent a serious problem that is an emergency. Do not wait to see if the symptoms will go away. Get medical help right away. Call your local emergency services (911 in the [place] Do not drive yourself to the hospital. This information is not intended to replace advice given to you by your health care provider. Make sure you discuss any questions you have with your health care provider. Document Released: 12/20/2002 Document Revised: 08/29/2015 Document Reviewed: 02/24/2015 Elsevier Interactive Patient Education  2017 [employer]   Aortic Insufficiency Aortic insufficiency is a condition in which the aortic valve does not close all the way. The aortic valve is a gate-like structure between the lower left chamber of the heart (left ventricle) and the main blood vessel that supplies blood to the rest of the body (aorta). The aortic valve opens when the left ventricle squeezes to pump blood into the aorta, and it closes when the left ventricle relaxes. In aortic insufficiency, blood in the  aorta leaks through the aortic valve after it has closed. This causes the heart to work harder than usual. If aortic insufficiency is  not treated, it causes enlargement and weakening of the left ventricle. This can result in heart failure, abnormal heart rhythms (arrhythmias), and other dangerous conditions. If this condition develops suddenly, it may need to be treated with emergency surgery. What are the causes? This condition may be caused by anything that weakens the aortic valve, such as:  Severe high blood pressure (hypertension).  Inflammation of the inner layer of the heart or the heart valves (endocarditis).  A ballooning of a weak spot in the aorta wall (aortic aneurysm).  A tear or separation of the inner walls of the aorta (aortic dissection).  Injury (trauma) that damages the aortic valve.  Certain medicines.  Disease of a protein in the body called collagen (collagen vascular disease).  A problem that is present at birth (birth defect).  An inflammatory condition that can develop after an untreated strep throat infection (rheumatic fever).  Complications during or after a heart surgery (rare).  What are the signs or symptoms? Symptoms of this condition include:  Fatigue.  Shortness of breath.  Difficulty breathing while lying flat (orthopnea). You may need to sleep on two or more pillows to breathe better.  Chest discomfort (angina).  Head bobbing.  A fluttering feeling in your chest (palpitations).  An irregular or faster-than-normal heartbeat.  Symptoms usually develop gradually, unless this condition was caused by a major injury or by endocarditis. How is this diagnosed? This condition is diagnosed based on the results of:  A physical exam.  An imaging test that uses sound waves to produce images of the heart (echocardiogram).  You may also have other tests to confirm the diagnosis, including:  Chest X-ray.  MRI.  A test that records the  electrical impulses of the heart (electrocardiogram, ECG).  CT angiogram (CTA). In this procedure, a large X-ray machine, called a CT scanner, takes detailed pictures of blood vessels after dye has been injected into the vessels.  Aortic angiogram. In this procedure, X-ray images are taken after dye has been injected into blood vessels. This tests the function of the aorta.  How is this treated? Treatment depends on your symptoms, how severe the condition is, and what problems the condition is causing. Treatment may include:  Observation. If your condition is mild, you may not need treatment. However, you will need to have your condition checked regularly to make sure it is not getting worse or causing serious problems.  Medicines that help the heart work more efficiently.  Surgery to repair or replace the valve, in severe cases. Surgery is usually recommended if the left ventricle enlarges beyond a certain point. If aortic insufficiency occurs suddenly, surgery may be needed immediately.  Follow these instructions at home:  Take over-the-counter and prescription medicines only as told by your health care provider.  Do not use any products that contain nicotine or tobacco, such as cigarettes and e-cigarettes. If you need help quitting, ask your health care provider.  If directed by your health care provider, avoid heavy weight lifting and contact sports such as football.  Keep all follow-up visits as told by your health care provider. This is important. You may need regular tests to monitor your condition and how well your heart is pumping blood.  Follow instructions from your health care provider about eating or drinking restrictions. Your health care provider may recommend that you: ? Limit alcohol intake to no more than 1 drink a day for nonpregnant women and 2 drinks a day for men. One drink equals 12  oz of beer, 5 oz of wine, or 1 oz of hard liquor. ? Eat foods that are high in  fiber, such as fresh fruits and vegetables, whole grains, and beans. ? Eat less salt (sodium) and salty foods. Check ingredients and nutrition facts on packaged foods and beverages. Contact a health care provider if:  Your chest symptoms seem to be getting worse.  Your breathing problems seem to be getting worse.  You feel dizzy or close to fainting.  You have swelling in your feet, ankles, legs, or abdomen.  You urinate more than usual during the night (nocturia).  You have an unexplained fever that lasts 2 days or longer.  You develop new symptoms. Get help right away if:  You have severe chest pain.  You have severe shortness of breath.  You feel rapid or irregular heartbeats.  You feel light-headed.  You have sudden, unexplained weight gain. Summary  Aortic insufficiency is a condition in which the aortic valve does not close all the way. This causes the heart to work harder than usual.  This condition may be treated with observation, medicines, or surgery.  Take over-the-counter and prescription medicines only as told by your health care provider.  Eat less salt (sodium) and salty foods. Check ingredients and nutrition facts on packaged foods and beverages. This information is not intended to replace advice given to you by your health care provider. Make sure you discuss any questions you have with your health care provider. Document Released: 09/27/2002 Document Revised: 07/13/2016 Document Reviewed: 02/10/2016 Elsevier Interactive Patient Education  [employer].

## 2017-10-05 NOTE — Progress Notes (Signed)
Pre visit review using our clinic review tool, if applicable. No additional management support is needed unless otherwise documented below in the visit note. 

## 2017-10-05 NOTE — Progress Notes (Signed)
Chief Complaint  Patient presents with  . Follow-up   F/u with niece vickie doing well  1. htn improved on telmisartan 80, coreg 25 bid, chlorthalidone 25 mg qd  2. Edema improved on compression stockings and diuretic. Pt declines arterial US  3. [place] + fatty liver and 2.7 renal cyst  4. Echo + AR/AS and pulm HTN pt declines pulm and cardiac f/u   Review of Systems  Constitutional: Negative for weight loss.  Respiratory: Negative for shortness of breath.   Cardiovascular: Negative for chest pain and leg swelling.       +leg swelling improved   Skin: Negative for rash.  Neurological: Negative for dizziness.  Psychiatric/Behavioral: Negative for memory loss.   Past Medical History:  Diagnosis Date  . Diabetes mellitus without complication ([place])   . Hyperlipidemia   . Hypertension    Past Surgical History:  Procedure Laterality Date  . APPENDECTOMY     1985  . BRAIN SURGERY  1961   removed bone from inside skull that was pressing on optic nerve  . HERNIA REPAIR     umbilical 6301   . TOE AMPUTATION     right great toe 2016   Family History  Problem Relation Age of Onset  . Colon cancer Brother   . Cancer Brother        colon   . Stroke Brother   . Heart disease Daughter        MI   Social History   Socioeconomic History  . Marital status: Widowed    Spouse name: Not on file  . Number of children: 3  . Years of education: Not on file  . Highest education level: Not on file  Occupational History  . Not on file  Social Needs  . Financial resource strain: Not on file  . Food insecurity:    Worry: Not on file    Inability: Not on file  . Transportation needs:    Medical: Not on file    Non-medical: Not on file  Tobacco Use  . Smoking status: Former Smoker    Last attempt to quit: 1961    Years since quitting: 58.5  . Smokeless tobacco: Never Used  Substance and Sexual Activity  . Alcohol use: No  . Drug use: Never  . Sexual activity: Not Currently   Lifestyle  . Physical activity:    Days per week: Not on file    Minutes per session: Not on file  . Stress: Not on file  Relationships  . Social connections:    Talks on phone: Not on file    Gets together: Not on file    Attends religious service: Not on file    Active member of club or organization: Not on file    Attends meetings of clubs or organizations: Not on file    Relationship status: Not on file  . Intimate partner violence:    Fear of current or ex partner: Not on file    Emotionally abused: Not on file    Physically abused: Not on file    Forced sexual activity: Not on file  Other Topics Concern  . Not on file  Social History Narrative   3 kids    Niece Peggyann [place] [phone] involved in care    Owns guns, wears seat belts, safe in relationship   Retired, widower   Some high school ed    [employer] car [employer]    Current Meds  Medication Sig  . acetaminophen (TYLENOL) 500 MG tablet Take 1,000 mg by mouth 2 (two) times daily.  Marland Kitchen aspirin EC 81 MG tablet Take 81 mg by mouth daily.  . carvedilol (COREG) 25 MG tablet Take 1 tablet (25 mg total) by mouth 2 (two) times daily.  . chlorthalidone (HYGROTON) 25 MG tablet Take 1 tablet (25 mg total) by mouth daily.  . metFORMIN (GLUCOPHAGE-XR) 500 MG 24 hr tablet Take 1 tablet (500 mg total) by mouth daily.  . Omega-3 Fatty Acids (FISH OIL) 1000 MG CAPS Take 1 capsule by mouth 2 (two) times daily.  Marland Kitchen telmisartan (MICARDIS) 80 MG tablet Take 1 tablet (80 mg total) by mouth daily.  . traMADol (ULTRAM) 50 MG tablet Take 1 tablet (50 mg total) by mouth 2 (two) times daily as needed.  . tuberculin (APLISOL) 5 UNIT/0.1ML injection Inject into the skin.   No Known Allergies Recent Results (from the past 2160 hour(s))  HM DIABETES EYE EXAM     Status: None   Collection Time: 07/08/17 12:00 AM  Result Value Ref Range   HM Diabetic Eye Exam No Retinopathy No Retinopathy    Comment: Dr. Gertie Baron 07/08/17 neg DM retinopathy and  edema neg   Comprehensive metabolic panel     Status: Abnormal   Collection Time: 08/05/17  4:01 PM  Result Value Ref Range   Sodium 140 135 - 145 mEq/L   Potassium 3.8 3.5 - 5.1 mEq/L   Chloride 101 96 - 112 mEq/L   CO2 31 19 - 32 mEq/L   Glucose, Bld 95 70 - 99 mg/dL   BUN 15 6 - 23 mg/dL   Creatinine, Ser 1.18 0.40 - 1.50 mg/dL   Total Bilirubin 0.6 0.2 - 1.2 mg/dL   Alkaline Phosphatase 53 39 - 117 U/L   AST 44 (H) 0 - 37 U/L   ALT 23 0 - 53 U/L   Total Protein 7.9 6.0 - 8.3 g/dL   Albumin 4.1 3.5 - 5.2 g/dL   Calcium 9.8 8.4 - 10.5 mg/dL   GFR 74.98 >60.00 mL/min  CBC w/Diff     Status: Abnormal   Collection Time: 08/05/17  4:01 PM  Result Value Ref Range   WBC 4.4 4.0 - 10.5 K/uL   RBC 5.20 4.22 - 5.81 Mil/uL   Hemoglobin 13.1 13.0 - 17.0 g/dL   HCT 40.5 39.0 - 52.0 %   MCV 77.9 (L) 78.0 - 100.0 fl   MCHC 32.3 30.0 - 36.0 g/dL   RDW 16.0 (H) 11.5 - 15.5 %   Platelets 236.0 150.0 - 400.0 K/uL   Neutrophils Relative % 46.0 43.0 - 77.0 %   Lymphocytes Relative 42.0 12.0 - 46.0 %   Monocytes Relative 9.0 3.0 - 12.0 %   Eosinophils Relative 1.2 0.0 - 5.0 %   Basophils Relative 1.8 0.0 - 3.0 %   Neutro Abs 2.0 1.4 - 7.7 K/uL   Lymphs Abs 1.8 0.7 - 4.0 K/uL   Monocytes Absolute 0.4 0.1 - 1.0 K/uL   Eosinophils Absolute 0.1 0.0 - 0.7 K/uL   Basophils Absolute 0.1 0.0 - 0.1 K/uL  Hemoglobin A1c     Status: None   Collection Time: 08/05/17  4:01 PM  Result Value Ref Range   Hgb A1c MFr Bld 6.4 4.6 - 6.5 %    Comment: Glycemic Control Guidelines for People with Diabetes:Non Diabetic:  <6%Goal of Therapy: <7%Additional Action Suggested:  >8%   TSH     Status:  None   Collection Time: 08/05/17  4:01 PM  Result Value Ref Range   TSH 2.03 0.35 - 4.50 uIU/mL  T4, free     Status: None   Collection Time: 08/05/17  4:01 PM  Result Value Ref Range   Free T4 0.75 0.60 - 1.60 ng/dL    Comment: Specimens from patients who are undergoing biotin therapy and /or ingesting biotin  supplements may contain high levels of biotin.  The higher biotin concentration in these specimens interferes with this Free T4 assay.  Specimens that contain high levels  of biotin may cause false high results for this Free T4 assay.  Please interpret results in light of the total clinical presentation of the patient.    Urinalysis, Routine w reflex microscopic     Status: None   Collection Time: 08/05/17  4:01 PM  Result Value Ref Range   Specific Gravity, UA 1.014 1.005 - 1.030   pH, UA 7.5 5.0 - 7.5   Color, UA Yellow Yellow   Appearance Ur Clear Clear   Leukocytes, UA Negative Negative   Protein, UA Trace Negative/Trace   Glucose, UA Negative Negative   Ketones, UA Negative Negative   RBC, UA Negative Negative   Bilirubin, UA Negative Negative   Urobilinogen, Ur 0.2 0.2 - 1.0 mg/dL   Nitrite, UA Negative Negative   Microscopic Examination Comment     Comment: Microscopic not indicated and not performed.  Urine Microalbumin w/creat. ratio     Status: Abnormal   Collection Time: 08/05/17  4:01 PM  Result Value Ref Range   Microalb, Ur 8.9 (H) 0.0 - 1.9 mg/dL   Creatinine,U 71.3 mg/dL   Microalb Creat Ratio 12.5 0.0 - 30.0 mg/g  Iron, TIBC and Ferritin Panel     Status: None   Collection Time: 08/05/17  4:01 PM  Result Value Ref Range   Iron 85 50 - 180 mcg/dL   TIBC 322 250 - 425 mcg/dL (calc)   %SAT 26 15 - 60 % (calc)   Ferritin 57 20 - 380 ng/mL  Vitamin D (25 hydroxy)     Status: None   Collection Time: 08/05/17  4:01 PM  Result Value Ref Range   VITD 38.50 30.00 - 100.00 ng/mL  Basic Metabolic Panel (BMET)     Status: Abnormal   Collection Time: 08/19/17 11:08 AM  Result Value Ref Range   Sodium 137 135 - 145 mEq/L   Potassium 3.9 3.5 - 5.1 mEq/L   Chloride 101 96 - 112 mEq/L   CO2 29 19 - 32 mEq/L   Glucose, Bld 129 (H) 70 - 99 mg/dL   BUN 21 6 - 23 mg/dL   Creatinine, Ser 1.26 0.40 - 1.50 mg/dL   Calcium 9.9 8.4 - 10.5 mg/dL   GFR 69.51 >60.00 mL/min   Hepatitis B surface antibody     Status: Abnormal   Collection Time: 08/19/17 11:08 AM  Result Value Ref Range   Hepatitis B-Post <5 (L) > OR = 10 mIU/mL    Comment: . Patient does not have immunity to hepatitis B virus. . For additional information, please refer to http://education.[facility].com/faq/FAQ105 (This link is being provided for informational/ educational purposes only).   Hepatitis B surface antigen     Status: None   Collection Time: 08/19/17 11:08 AM  Result Value Ref Range   Hepatitis B Surface Ag NON-REACTIVE NON-REACTI  Hepatitis C antibody     Status: None   Collection Time: 08/19/17 11:08 AM  Result Value Ref Range   Hepatitis C Ab NON-REACTIVE NON-REACTI   SIGNAL TO CUT-OFF 0.04 <1.00    Comment: . HCV antibody was non-reactive. There is no laboratory  evidence of HCV infection. . In most cases, no further action is required. However, if recent HCV exposure is suspected, a test for HCV RNA (test code [phone]) is suggested. . For additional information please refer to http://education.[facility].com/faq/FAQ22v1 (This link is being provided for informational/ educational purposes only.) .   Lipid panel     Status: Abnormal   Collection Time: 08/19/17 11:08 AM  Result Value Ref Range   Cholesterol 183 0 - 200 mg/dL    Comment: ATP III Classification       Desirable:  < 200 mg/dL               Borderline High:  200 - 239 mg/dL          High:  > = 240 mg/dL   Triglycerides 150.0 (H) 0.0 - 149.0 mg/dL    Comment: Normal:  <150 mg/dLBorderline High:  150 - 199 mg/dL   HDL 46.20 >39.00 mg/dL   VLDL 30.0 0.0 - 40.0 mg/dL   LDL Cholesterol 107 (H) 0 - 99 mg/dL   Total CHOL/HDL Ratio 4     Comment:                Men          Women1/2 Average Risk     3.4          3.3Average Risk          5.0          4.42X Average Risk          9.6          7.13X Average Risk          15.0          11.0                       NonHDL 136.69     Comment: NOTE:   Non-HDL goal should be 30 mg/dL higher than patient's LDL goal (i.e. LDL goal of < 70 mg/dL, would have non-HDL goal of < 100 mg/dL)   Objective  Body mass index is 28.47 kg/m. Wt Readings from Last 3 Encounters:  10/05/17 204 lb 1.6 oz (92.6 kg)  08/19/17 205 lb 9.6 oz (93.3 kg)  08/17/17 204 lb (92.5 kg)   Temp Readings from Last 3 Encounters:  10/05/17 98.1 F (36.7 C) (Oral)  08/19/17 98.3 F (36.8 C) (Oral)  08/05/17 98.1 F (36.7 C) (Oral)   BP Readings from Last 3 Encounters:  10/05/17 (!) 150/80  08/19/17 (!) 160/84  08/17/17 (!) 216/86   Pulse Readings from Last 3 Encounters:  10/05/17 88  08/19/17 66  08/17/17 63    Physical Exam  Constitutional: He is oriented to person, place, and time. Vital signs are normal. He appears well-developed and well-nourished. He is cooperative.  HENT:  Head: Normocephalic and atraumatic.  Mouth/Throat: Oropharynx is clear and moist and mucous membranes are normal.  Eyes: Pupils are equal, round, and reactive to light. Conjunctivae are normal.  Cardiovascular: Normal rate and regular rhythm.  Murmur heard. 1 to 2+ leg edema b/l   Pulmonary/Chest: Effort normal and breath sounds normal.  Neurological: He is alert and oriented to person, place, and time. Gait normal.  Skin: Skin is warm, dry and intact.  Psychiatric: He has a normal mood and affect. His speech is normal and behavior is normal. Judgment and thought content normal. Cognition and memory are normal.  Nursing note and vitals reviewed.   Assessment   1. HTN repeat improved  2. DM 2  3. Fatty liver  4. B/l leg edema improved  5. AR/AS, pulm HTN  6. HM Plan   1. Cont meds for now  Consider add spironlactone 12.5 in future vs hydralazine  2. Cont meds for now  Labs at f/u  3. Ed today and disc result  4. Cont compression  Cont meds  Declines arterial duplex  5. Declined cards and lung md f/u  6.  Never had flu shot  Tdap had 05/07/14 pna 23 dueat f/ugiven  today  prevnar had 05/07/14  Zoster had 05/07/14 consider shingrix in future   Never had colonoscopy  Out of age window PSA Pending echo w/u cardiac murmur    New pharmacy [facility]    Provider: Dr. Olivia Mackie [facility]

## 2017-10-25 ENCOUNTER — Telehealth: Payer: Self-pay | Admitting: Internal Medicine

## 2017-10-25 NOTE — Telephone Encounter (Signed)
Copied from [place] [id number]. Topic: Quick Communication - See Telephone Encounter >> Oct 25, 2017  1:57 PM Ivar Drape wrote: CRM for notification. See Telephone encounter for: 10/25/17. [facility] [place] w/[facility] [phone] would like the following prescriptions filled for the patient and faxed to them at [phone] 1) metFORMIN (GLUCOPHAGE-XR) 500 MG 24 hr tablet  2) carvedilol (COREG) 25 MG tablet

## 2017-10-27 ENCOUNTER — Telehealth: Payer: Self-pay | Admitting: Internal Medicine

## 2017-10-27 ENCOUNTER — Other Ambulatory Visit: Payer: Self-pay

## 2017-10-27 DIAGNOSIS — I10 Essential (primary) hypertension: Secondary | ICD-10-CM

## 2017-10-27 DIAGNOSIS — E119 Type 2 diabetes mellitus without complications: Secondary | ICD-10-CM

## 2017-10-27 MED ORDER — CARVEDILOL 25 MG PO TABS: 25.0000 mg | ORAL_TABLET | Freq: Two times a day (BID) | ORAL | 3 refills | Status: DC

## 2017-10-27 MED ORDER — METFORMIN HCL ER 500 MG PO TB24: 500.0000 mg | ORAL_TABLET | Freq: Every day | ORAL | 3 refills | Status: DC

## 2017-10-27 NOTE — Telephone Encounter (Unsigned)
Copied from [place] [id number]. Topic: Quick Communication - Rx Refill/Question >> Oct 27, 2017  2:43 PM Judyann Munson wrote: Medication: carvedilol (COREG) 25 MG tablet,metFORMIN (GLUCOPHAGE-XR) 500 MG 24 hr tablet  Has the patient contacted their pharmacy?yes   Preferred Pharmacy (with phone number or street name): [place], [place] - [address] [phone] (Phone) [phone] (Fax)   Pharmacy stated  we can faxed to them at [phone]      Agent: Please be advised that RX refills may take up to 3 business days. We ask that you follow-up with your pharmacy.

## 2017-10-27 NOTE — Telephone Encounter (Signed)
rx for metformin and coreg has been sent to [facility]

## 2017-10-28 ENCOUNTER — Other Ambulatory Visit: Payer: Self-pay

## 2017-10-28 ENCOUNTER — Encounter: Payer: Self-pay | Admitting: Internal Medicine

## 2017-10-28 DIAGNOSIS — M79605 Pain in left leg: Secondary | ICD-10-CM

## 2017-10-28 DIAGNOSIS — M79604 Pain in right leg: Secondary | ICD-10-CM

## 2017-10-28 DIAGNOSIS — E119 Type 2 diabetes mellitus without complications: Secondary | ICD-10-CM

## 2017-10-28 DIAGNOSIS — I10 Essential (primary) hypertension: Secondary | ICD-10-CM

## 2017-10-28 MED ORDER — TELMISARTAN 80 MG PO TABS: 80.0000 mg | ORAL_TABLET | Freq: Every day | ORAL | 1 refills | Status: DC

## 2017-10-28 MED ORDER — TRAMADOL HCL 50 MG PO TABS: 50.0000 mg | ORAL_TABLET | Freq: Two times a day (BID) | ORAL | 0 refills | Status: DC | PRN

## 2017-10-28 MED ORDER — CARVEDILOL 25 MG PO TABS: 25.0000 mg | ORAL_TABLET | Freq: Two times a day (BID) | ORAL | 3 refills | Status: DC

## 2017-10-28 MED ORDER — CHLORTHALIDONE 25 MG PO TABS: 25.0000 mg | ORAL_TABLET | Freq: Every day | ORAL | 1 refills | Status: DC

## 2017-10-28 MED ORDER — METFORMIN HCL ER 500 MG PO TB24: 500.0000 mg | ORAL_TABLET | Freq: Every day | ORAL | 3 refills | Status: DC

## 2017-10-28 MED ORDER — ASPIRIN EC 81 MG PO TBEC: 81.0000 mg | DELAYED_RELEASE_TABLET | Freq: Every day | ORAL | 1 refills | Status: AC

## 2018-02-10 ENCOUNTER — Encounter: Payer: Self-pay | Admitting: Internal Medicine

## 2018-02-10 ENCOUNTER — Ambulatory Visit (INDEPENDENT_AMBULATORY_CARE_PROVIDER_SITE_OTHER): Payer: Medicare Other | Admitting: Internal Medicine

## 2018-02-10 VITALS — BP 180/90 | HR 106 | Temp 98.9°F | Resp 15 | Ht 71.0 in | Wt 205.2 lb

## 2018-02-10 DIAGNOSIS — I10 Essential (primary) hypertension: Secondary | ICD-10-CM | POA: Diagnosis not present

## 2018-02-10 DIAGNOSIS — Z23 Encounter for immunization: Secondary | ICD-10-CM | POA: Diagnosis not present

## 2018-02-10 DIAGNOSIS — R600 Localized edema: Secondary | ICD-10-CM | POA: Diagnosis not present

## 2018-02-10 DIAGNOSIS — E119 Type 2 diabetes mellitus without complications: Secondary | ICD-10-CM | POA: Diagnosis not present

## 2018-02-10 DIAGNOSIS — E785 Hyperlipidemia, unspecified: Secondary | ICD-10-CM

## 2018-02-10 LAB — CBC WITH DIFFERENTIAL/PLATELET
BASOS ABS: 0 10*3/uL (ref 0.0–0.1)
Basophils Relative: 1 % (ref 0.0–3.0)
EOS ABS: 0.1 10*3/uL (ref 0.0–0.7)
Eosinophils Relative: 1.7 % (ref 0.0–5.0)
HEMATOCRIT: 39.5 % (ref 39.0–52.0)
Hemoglobin: 12.8 g/dL — ABNORMAL LOW (ref 13.0–17.0)
LYMPHS ABS: 1.5 10*3/uL (ref 0.7–4.0)
LYMPHS PCT: 33.8 % (ref 12.0–46.0)
MCHC: 32.4 g/dL (ref 30.0–36.0)
MCV: 79.4 fl (ref 78.0–100.0)
MONO ABS: 0.3 10*3/uL (ref 0.1–1.0)
Monocytes Relative: 7.4 % (ref 3.0–12.0)
NEUTROS PCT: 56.1 % (ref 43.0–77.0)
Neutro Abs: 2.5 10*3/uL (ref 1.4–7.7)
PLATELETS: 256 10*3/uL (ref 150.0–400.0)
RBC: 4.97 Mil/uL (ref 4.22–5.81)
RDW: 15.2 % (ref 11.5–15.5)
WBC: 4.4 10*3/uL (ref 4.0–10.5)

## 2018-02-10 LAB — COMPREHENSIVE METABOLIC PANEL
ALT: 20 U/L (ref 0–53)
AST: 39 U/L — AB (ref 0–37)
Albumin: 4.3 g/dL (ref 3.5–5.2)
Alkaline Phosphatase: 64 U/L (ref 39–117)
BILIRUBIN TOTAL: 0.5 mg/dL (ref 0.2–1.2)
BUN: 14 mg/dL (ref 6–23)
CALCIUM: 9.9 mg/dL (ref 8.4–10.5)
CO2: 29 mEq/L (ref 19–32)
CREATININE: 1.09 mg/dL (ref 0.40–1.50)
Chloride: 103 mEq/L (ref 96–112)
GFR: 82.07 mL/min (ref >60.00)
Glucose, Bld: 141 mg/dL — ABNORMAL HIGH (ref 70–99)
Potassium: 4 mEq/L (ref 3.5–5.1)
SODIUM: 139 meq/L (ref 135–145)
Total Protein: 8.2 g/dL (ref 6.0–8.3)

## 2018-02-10 LAB — HEMOGLOBIN A1C: HEMOGLOBIN A1C: 6.2 % (ref 4.6–6.5)

## 2018-02-10 LAB — MICROALBUMIN / CREATININE URINE RATIO
CREATININE, U: 123.1 mg/dL
MICROALB UR: 21.2 mg/dL — AB (ref 0.0–1.9)
MICROALB/CREAT RATIO: 17.2 mg/g (ref 0.0–30.0)

## 2018-02-10 MED ORDER — FISH OIL 1000 MG PO CAPS: 1.0000 | ORAL_CAPSULE | Freq: Two times a day (BID) | ORAL | 3 refills | Status: AC

## 2018-02-10 MED ORDER — CHLORTHALIDONE 25 MG PO TABS: 25.0000 mg | ORAL_TABLET | Freq: Every day | ORAL | 3 refills | Status: AC

## 2018-02-10 MED ORDER — METFORMIN HCL ER 500 MG PO TB24: 500.0000 mg | ORAL_TABLET | Freq: Every day | ORAL | 3 refills | Status: DC

## 2018-02-10 MED ORDER — CARVEDILOL 25 MG PO TABS: 25.0000 mg | ORAL_TABLET | Freq: Two times a day (BID) | ORAL | 3 refills | Status: AC

## 2018-02-10 MED ORDER — TELMISARTAN 80 MG PO TABS: 80.0000 mg | ORAL_TABLET | Freq: Every day | ORAL | 3 refills | Status: AC

## 2018-02-10 NOTE — Patient Instructions (Signed)
Please take all medication Coreg, chlorthalidone, and micardis in 1/2 dose x 3 days then 1 pill daily Day 4 take full dose of all blood pressure medications     Hypertension Hypertension, commonly called high blood pressure, is when the force of blood pumping through the arteries is too strong. The arteries are the blood vessels that carry blood from the heart throughout the body. Hypertension forces the heart to work harder to pump blood and may cause arteries to become narrow or stiff. Having untreated or uncontrolled hypertension can cause heart attacks, strokes, kidney disease, and other problems. A blood pressure reading consists of a higher number over a lower number. Ideally, your blood pressure should be below 120/80. The first ("top") number is called the systolic pressure. It is a measure of the pressure in your arteries as your heart beats. The second ("bottom") number is called the diastolic pressure. It is a measure of the pressure in your arteries as the heart relaxes. What are the causes? The cause of this condition is not known. What increases the risk? Some risk factors for high blood pressure are under your control. Others are not. Factors you can change  Smoking.  Having type 2 diabetes mellitus, high cholesterol, or both.  Not getting enough exercise or physical activity.  Being overweight.  Having too much fat, sugar, calories, or salt (sodium) in your diet.  Drinking too much alcohol. Factors that are difficult or impossible to change  Having chronic kidney disease.  Having a family history of high blood pressure.  Age. Risk increases with age.  Race. You may be at higher risk if you are African-American.  Gender. Men are at higher risk than women before age 42. After age 29, women are at higher risk than men.  Having obstructive sleep apnea.  Stress. What are the signs or symptoms? Extremely high blood pressure (hypertensive crisis) may  cause:  Headache.  Anxiety.  Shortness of breath.  Nosebleed.  Nausea and vomiting.  Severe chest pain.  Jerky movements you cannot control (seizures).  How is this diagnosed? This condition is diagnosed by measuring your blood pressure while you are seated, with your arm resting on a surface. The cuff of the blood pressure monitor will be placed directly against the skin of your upper arm at the level of your heart. It should be measured at least twice using the same arm. Certain conditions can cause a difference in blood pressure between your right and left arms. Certain factors can cause blood pressure readings to be lower or higher than normal (elevated) for a short period of time:  When your blood pressure is higher when you are in a health care provider's office than when you are at home, this is called white coat hypertension. Most people with this condition do not need medicines.  When your blood pressure is higher at home than when you are in a health care provider's office, this is called masked hypertension. Most people with this condition may need medicines to control blood pressure.  If you have a high blood pressure reading during one visit or you have normal blood pressure with other risk factors:  You may be asked to return on a different day to have your blood pressure checked again.  You may be asked to monitor your blood pressure at home for 1 week or longer.  If you are diagnosed with hypertension, you may have other blood or imaging tests to help your health care provider understand  your overall risk for other conditions. How is this treated? This condition is treated by making healthy lifestyle changes, such as eating healthy foods, exercising more, and reducing your alcohol intake. Your health care provider may prescribe medicine if lifestyle changes are not enough to get your blood pressure under control, and if:  Your systolic blood pressure is above  130.  Your diastolic blood pressure is above 80.  Your personal target blood pressure may vary depending on your medical conditions, your age, and other factors. Follow these instructions at home: Eating and drinking  Eat a diet that is high in fiber and potassium, and low in sodium, added sugar, and fat. An example eating plan is called the DASH (Dietary Approaches to Stop Hypertension) diet. To eat this way: ? Eat plenty of fresh fruits and vegetables. Try to fill half of your plate at each meal with fruits and vegetables. ? Eat whole grains, such as whole wheat pasta, brown rice, or whole grain bread. Fill about one quarter of your plate with whole grains. ? Eat or drink low-fat dairy products, such as skim milk or low-fat yogurt. ? Avoid fatty cuts of meat, processed or cured meats, and poultry with skin. Fill about one quarter of your plate with lean proteins, such as fish, chicken without skin, beans, eggs, and tofu. ? Avoid premade and processed foods. These tend to be higher in sodium, added sugar, and fat.  Reduce your daily sodium intake. Most people with hypertension should eat less than 1,500 mg of sodium a day.  Limit alcohol intake to no more than 1 drink a day for nonpregnant women and 2 drinks a day for men. One drink equals 12 oz of beer, 5 oz of wine, or 1 oz of hard liquor. Lifestyle  Work with your health care provider to maintain a healthy body weight or to lose weight. Ask what an ideal weight is for you.  Get at least 30 minutes of exercise that causes your heart to beat faster (aerobic exercise) most days of the week. Activities may include walking, swimming, or biking.  Include exercise to strengthen your muscles (resistance exercise), such as pilates or lifting weights, as part of your weekly exercise routine. Try to do these types of exercises for 30 minutes at least 3 days a week.  Do not use any products that contain nicotine or tobacco, such as cigarettes and  e-cigarettes. If you need help quitting, ask your health care provider.  Monitor your blood pressure at home as told by your health care provider.  Keep all follow-up visits as told by your health care provider. This is important. Medicines  Take over-the-counter and prescription medicines only as told by your health care provider. Follow directions carefully. Blood pressure medicines must be taken as prescribed.  Do not skip doses of blood pressure medicine. Doing this puts you at risk for problems and can make the medicine less effective.  Ask your health care provider about side effects or reactions to medicines that you should watch for. Contact a health care provider if:  You think you are having a reaction to a medicine you are taking.  You have headaches that keep coming back (recurring).  You feel dizzy.  You have swelling in your ankles.  You have trouble with your vision. Get help right away if:  You develop a severe headache or confusion.  You have unusual weakness or numbness.  You feel faint.  You have severe pain in your chest  or abdomen.  You vomit repeatedly.  You have trouble breathing. Summary  Hypertension is when the force of blood pumping through your arteries is too strong. If this condition is not controlled, it may put you at risk for serious complications.  Your personal target blood pressure may vary depending on your medical conditions, your age, and other factors. For most people, a normal blood pressure is less than 120/80.  Hypertension is treated with lifestyle changes, medicines, or a combination of both. Lifestyle changes include weight loss, eating a healthy, low-sodium diet, exercising more, and limiting alcohol. This information is not intended to replace advice given to you by your health care provider. Make sure you discuss any questions you have with your health care provider. Document Released: 03/23/2005 Document Revised: 02/19/2016  Document Reviewed: 02/19/2016 Elsevier Interactive Patient Education  [employer].

## 2018-02-10 NOTE — Progress Notes (Signed)
Chief Complaint  Patient presents with  . Follow-up   F/u with niece vickie 1. HTN uncontrolled not had meds coreg 25 mg bid, chlorthalidone 25 mg qd, micardis in 2.5 weeks b/c did not like pill pak and wanted bottles instead. He is w/o sx's today  2. Leg edema improved with compression stockings but since out of chlorthalidone x 2.5 weeks starting to come back 3. DM 2 A1C 6.4 on metformin 500  4. Left knee pain mild likely arthritis rec tylenol prn otc topical declines Xray fo rnow   Review of Systems  Constitutional: Negative for weight loss.  HENT: Negative for hearing loss.   Eyes: Negative for blurred vision.  Respiratory: Negative for shortness of breath.   Cardiovascular: Positive for leg swelling. Negative for chest pain.  Gastrointestinal: Negative for abdominal pain.  Skin: Negative for rash.  Neurological: Negative for headaches.   Past Medical History:  Diagnosis Date  . Diabetes mellitus without complication ([place])   . Hyperlipidemia   . Hypertension    Past Surgical History:  Procedure Laterality Date  . APPENDECTOMY     1985  . BRAIN SURGERY  1961   removed bone from inside skull that was pressing on optic nerve  . HERNIA REPAIR     umbilical 0350   . TOE AMPUTATION     right great toe 2016   Family History  Problem Relation Age of Onset  . Colon cancer Brother   . Cancer Brother        colon   . Stroke Brother   . Heart disease Daughter        MI   Social History   Socioeconomic History  . Marital status: Widowed    Spouse name: Not on file  . Number of children: 3  . Years of education: Not on file  . Highest education level: Not on file  Occupational History  . Not on file  Social Needs  . Financial resource strain: Not on file  . Food insecurity:    Worry: Not on file    Inability: Not on file  . Transportation needs:    Medical: Not on file    Non-medical: Not on file  Tobacco Use  . Smoking status: Former Smoker    Last attempt to  quit: 1961    Years since quitting: 58.8  . Smokeless tobacco: Never Used  Substance and Sexual Activity  . Alcohol use: No  . Drug use: Never  . Sexual activity: Not Currently  Lifestyle  . Physical activity:    Days per week: Not on file    Minutes per session: Not on file  . Stress: Not on file  Relationships  . Social connections:    Talks on phone: Not on file    Gets together: Not on file    Attends religious service: Not on file    Active member of club or organization: Not on file    Attends meetings of clubs or organizations: Not on file    Relationship status: Not on file  . Intimate partner violence:    Fear of current or ex partner: Not on file    Emotionally abused: Not on file    Physically abused: Not on file    Forced sexual activity: Not on file  Other Topics Concern  . Not on file  Social History Narrative   3 kids    Niece Peggyann Shoals [phone] involved in care    Owns  guns, wears seat belts, safe in relationship   Retired, widower   Some high school ed    Ambulance person    Current Meds  Medication Sig  . acetaminophen (TYLENOL) 500 MG tablet Take 1,000 mg by mouth 2 (two) times daily.  . Omega-3 Fatty Acids (FISH OIL) 1000 MG CAPS Take 1 capsule (1,000 mg total) by mouth 2 (two) times daily.  . [DISCONTINUED] Omega-3 Fatty Acids (FISH OIL) 1000 MG CAPS Take 1 capsule by mouth 2 (two) times daily.   No Known Allergies No results found for this or any previous visit (from the past 2160 hour(s)). Objective  Body mass index is 28.63 kg/m. Wt Readings from Last 3 Encounters:  02/10/18 205 lb 4 oz (93.1 kg)  10/05/17 204 lb 1.6 oz (92.6 kg)  08/19/17 205 lb 9.6 oz (93.3 kg)   Temp Readings from Last 3 Encounters:  02/10/18 98.9 F (37.2 C) (Oral)  10/05/17 98.1 F (36.7 C) (Oral)  08/19/17 98.3 F (36.8 C) (Oral)   BP Readings from Last 3 Encounters:  02/10/18 (!) 180/90  10/05/17 (!) 144/70  08/19/17 (!) 160/84   Pulse Readings  from Last 3 Encounters:  02/10/18 (!) 106  10/05/17 88  08/19/17 66    Physical Exam  Constitutional: He is oriented to person, place, and time. Vital signs are normal. He appears well-developed and well-nourished. He is cooperative.  HENT:  Head: Normocephalic and atraumatic.  Mouth/Throat: Oropharynx is clear and moist and mucous membranes are normal.  Eyes: Pupils are equal, round, and reactive to light. Conjunctivae are normal.  Cardiovascular: Normal rate and regular rhythm.  Murmur heard. Pulmonary/Chest: Effort normal and breath sounds normal.  Neurological: He is alert and oriented to person, place, and time. Gait normal.  BL walks with cane   Skin: Skin is warm, dry and intact.  Psychiatric: He has a normal mood and affect. His speech is normal and behavior is normal. Judgment and thought content normal. Cognition and memory are normal.  Nursing note and vitals reviewed.   Assessment   1. htn 2. Leg edema 1+ b/l improved with diuretic and compression 3. DM 2 A1C 6.4  4. HM 5. Left knee pain  Plan   1. Cont meds take 1/2 dose BP x x 3 days then full dose day 4  2. See above  3. Check labs today CMET, CBC, A1C, urine protein  Cont metformin same dose  4.  Flu shot given today  Tdap had 05/07/14 pna 23 dueat f/ugiven today prevnar had 05/07/14  Zoster had 05/07/14 consider shingrix in future   Never had colonoscopy  Out of age window PSA Echo had  5. Declines Xray left knee now  rec prn tylenol and bengay or aspercream     Provider: Dr. Olivia Mackie McLean-Scocuzza-Internal Medicine

## 2018-03-09 ENCOUNTER — Ambulatory Visit (INDEPENDENT_AMBULATORY_CARE_PROVIDER_SITE_OTHER): Payer: Medicare Other | Admitting: Internal Medicine

## 2018-03-09 ENCOUNTER — Encounter: Payer: Self-pay | Admitting: Internal Medicine

## 2018-03-09 VITALS — BP 162/74 | HR 81 | Temp 98.7°F | Ht 71.0 in | Wt 192.0 lb

## 2018-03-09 DIAGNOSIS — R634 Abnormal weight loss: Secondary | ICD-10-CM | POA: Diagnosis not present

## 2018-03-09 DIAGNOSIS — R110 Nausea: Secondary | ICD-10-CM

## 2018-03-09 DIAGNOSIS — I10 Essential (primary) hypertension: Secondary | ICD-10-CM | POA: Diagnosis not present

## 2018-03-09 DIAGNOSIS — M170 Bilateral primary osteoarthritis of knee: Secondary | ICD-10-CM

## 2018-03-09 DIAGNOSIS — R630 Anorexia: Secondary | ICD-10-CM

## 2018-03-09 MED ORDER — DICLOFENAC SODIUM 1 % TD GEL: 2.0000 g | Freq: Four times a day (QID) | TRANSDERMAL | 12 refills | Status: AC

## 2018-03-09 NOTE — Progress Notes (Signed)
Pre visit review using our clinic review tool, if applicable. No additional management support is needed unless otherwise documented below in the visit note. 

## 2018-03-09 NOTE — Progress Notes (Signed)
Chief Complaint  Patient presents with  . Follow-up   F/u  1. HTN no meds today as of yet on micardis 80, chlorthalidone 25, coreg 25 mg bid  2. C/o wt loss and nausea is down 13 lbs since last visit. He also c/o reduced appetite declines w/u for now  3. B/l knee pain topical knee cream helps and wants Rx    Review of Systems  Constitutional: Positive for weight loss.  HENT: Negative for hearing loss.   Respiratory: Negative for shortness of breath.   Cardiovascular: Negative for chest pain.  Gastrointestinal: Positive for nausea.       +reduced appetite    Musculoskeletal: Positive for joint pain.  Skin: Negative for rash.   Past Medical History:  Diagnosis Date  . Diabetes mellitus without complication ([place])   . Hyperlipidemia   . Hypertension    Past Surgical History:  Procedure Laterality Date  . APPENDECTOMY     1985  . BRAIN SURGERY  1961   removed bone from inside skull that was pressing on optic nerve  . HERNIA REPAIR     umbilical 0539   . TOE AMPUTATION     right great toe 2016   Family History  Problem Relation Age of Onset  . Colon cancer Brother   . Cancer Brother        colon   . Stroke Brother   . Heart disease Daughter        MI   Social History   Socioeconomic History  . Marital status: Widowed    Spouse name: Not on file  . Number of children: 3  . Years of education: Not on file  . Highest education level: Not on file  Occupational History  . Not on file  Social Needs  . Financial resource strain: Not on file  . Food insecurity:    Worry: Not on file    Inability: Not on file  . Transportation needs:    Medical: Not on file    Non-medical: Not on file  Tobacco Use  . Smoking status: Former Smoker    Last attempt to quit: 1961    Years since quitting: 58.9  . Smokeless tobacco: Never Used  Substance and Sexual Activity  . Alcohol use: No  . Drug use: Never  . Sexual activity: Not Currently  Lifestyle  . Physical activity:     Days per week: Not on file    Minutes per session: Not on file  . Stress: Not on file  Relationships  . Social connections:    Talks on phone: Not on file    Gets together: Not on file    Attends religious service: Not on file    Active member of club or organization: Not on file    Attends meetings of clubs or organizations: Not on file    Relationship status: Not on file  . Intimate partner violence:    Fear of current or ex partner: Not on file    Emotionally abused: Not on file    Physically abused: Not on file    Forced sexual activity: Not on file  Other Topics Concern  . Not on file  Social History Narrative   3 kids    Niece Peggyann Shoals [phone] involved in care    Owns guns, wears seat belts, safe in relationship   Retired, widower   Some high school ed    Ambulance person    Current  Meds  Medication Sig  . acetaminophen (TYLENOL) 500 MG tablet Take 1,000 mg by mouth 2 (two) times daily.  Marland Kitchen aspirin EC 81 MG tablet Take 1 tablet (81 mg total) by mouth daily.  . carvedilol (COREG) 25 MG tablet Take 1 tablet (25 mg total) by mouth 2 (two) times daily.  . chlorthalidone (HYGROTON) 25 MG tablet Take 1 tablet (25 mg total) by mouth daily.  . metFORMIN (GLUCOPHAGE-XR) 500 MG 24 hr tablet Take 1 tablet (500 mg total) by mouth daily. With food  . Omega-3 Fatty Acids (FISH OIL) 1000 MG CAPS Take 1 capsule (1,000 mg total) by mouth 2 (two) times daily.  Marland Kitchen telmisartan (MICARDIS) 80 MG tablet Take 1 tablet (80 mg total) by mouth daily.  . traMADol (ULTRAM) 50 MG tablet Take 1 tablet (50 mg total) by mouth 2 (two) times daily as needed.  . tuberculin (APLISOL) 5 UNIT/0.1ML injection Inject into the skin.   No Known Allergies Recent Results (from the past 2160 hour(s))  Comprehensive metabolic panel     Status: Abnormal   Collection Time: 02/10/18  1:20 PM  Result Value Ref Range   Sodium 139 135 - 145 mEq/L   Potassium 4.0 3.5 - 5.1 mEq/L   Chloride 103 96 - 112  mEq/L   CO2 29 19 - 32 mEq/L   Glucose, Bld 141 (H) 70 - 99 mg/dL   BUN 14 6 - 23 mg/dL   Creatinine, Ser 1.09 0.40 - 1.50 mg/dL   Total Bilirubin 0.5 0.2 - 1.2 mg/dL   Alkaline Phosphatase 64 39 - 117 U/L   AST 39 (H) 0 - 37 U/L   ALT 20 0 - 53 U/L   Total Protein 8.2 6.0 - 8.3 g/dL   Albumin 4.3 3.5 - 5.2 g/dL   Calcium 9.9 8.4 - 10.5 mg/dL   GFR 82.07 >60.00 mL/min  CBC with Differential/Platelet     Status: Abnormal   Collection Time: 02/10/18  1:20 PM  Result Value Ref Range   WBC 4.4 4.0 - 10.5 K/uL   RBC 4.97 4.22 - 5.81 Mil/uL   Hemoglobin 12.8 (L) 13.0 - 17.0 g/dL   HCT 39.5 39.0 - 52.0 %   MCV 79.4 78.0 - 100.0 fl   MCHC 32.4 30.0 - 36.0 g/dL   RDW 15.2 11.5 - 15.5 %   Platelets 256.0 150.0 - 400.0 K/uL   Neutrophils Relative % 56.1 43.0 - 77.0 %   Lymphocytes Relative 33.8 12.0 - 46.0 %   Monocytes Relative 7.4 3.0 - 12.0 %   Eosinophils Relative 1.7 0.0 - 5.0 %   Basophils Relative 1.0 0.0 - 3.0 %   Neutro Abs 2.5 1.4 - 7.7 K/uL   Lymphs Abs 1.5 0.7 - 4.0 K/uL   Monocytes Absolute 0.3 0.1 - 1.0 K/uL   Eosinophils Absolute 0.1 0.0 - 0.7 K/uL   Basophils Absolute 0.0 0.0 - 0.1 K/uL  Hemoglobin A1c     Status: None   Collection Time: 02/10/18  1:20 PM  Result Value Ref Range   Hgb A1c MFr Bld 6.2 4.6 - 6.5 %    Comment: Glycemic Control Guidelines for People with Diabetes:Non Diabetic:  <6%Goal of Therapy: <7%Additional Action Suggested:  >8%   Urine Microalbumin w/creat. ratio     Status: Abnormal   Collection Time: 02/10/18  1:20 PM  Result Value Ref Range   Microalb, Ur 21.2 (H) 0.0 - 1.9 mg/dL   Creatinine,U 123.1 mg/dL   Microalb Creat Ratio  17.2 0.0 - 30.0 mg/g   Objective  Body mass index is 26.78 kg/m. Wt Readings from Last 3 Encounters:  03/09/18 192 lb (87.1 kg)  02/10/18 205 lb 4 oz (93.1 kg)  10/05/17 204 lb 1.6 oz (92.6 kg)   Temp Readings from Last 3 Encounters:  03/09/18 98.7 F (37.1 C) (Oral)  02/10/18 98.9 F (37.2 C) (Oral)   10/05/17 98.1 F (36.7 C) (Oral)   BP Readings from Last 3 Encounters:  03/09/18 (!) 162/74  02/10/18 (!) 180/90  10/05/17 (!) 144/70   Pulse Readings from Last 3 Encounters:  03/09/18 81  02/10/18 (!) 106  10/05/17 88    Physical Exam  Constitutional: He is oriented to person, place, and time. Vital signs are normal. He appears well-developed and well-nourished. He is cooperative.  HENT:  Head: Normocephalic and atraumatic.  Mouth/Throat: Oropharynx is clear and moist and mucous membranes are normal.  Eyes: Pupils are equal, round, and reactive to light. Conjunctivae are normal.  Cardiovascular: Normal rate and regular rhythm.  Murmur heard. Pulmonary/Chest: Effort normal and breath sounds normal.  Neurological: He is alert and oriented to person, place, and time. Gait normal.  Skin: Skin is warm, dry and intact.  Psychiatric: He has a normal mood and affect. His speech is normal and behavior is normal. Judgment and thought content normal. Cognition and memory are normal.  Nursing note and vitals reviewed.   Assessment   1. htn no meds today uncontrolled  2. Weight loss, nausea, reduced appetite  3. B/l knee pain likely OA 4. HM Plan   1. Cont meds  Log BP daily  2. Consider further w/u GI EGD/colonoscopy if not improved  3. voltaren gel  4.  Flu shot utd  Tdap had 05/07/14 pna 23 utd  prevnar had 05/07/14  Zoster had 05/07/14 consider shingrix in future   Never had colonoscopy  Out of age window PSA Echo had  Provider: Dr. Olivia Mackie McLean-Scocuzza-Internal Medicine

## 2018-03-09 NOTE — Patient Instructions (Addendum)
Premier protein shake >boost  If weight loss continues we need to work this up with stomach doctor colonoscopy and EGD Log you blood pressure take blood medications 2 hours before checking blood pressure  And try to limit salt    Failure to Thrive, Adult Failure to thrive is a group of problems. These problems include eating too little and losing weight. People who have this condition may do fewer and fewer activities over time. They may lose interest in being with friends or they may not want to eat or drink. Follow these instructions at home:  Take medicines only as told by your doctor.  Eat a healthy, well-balanced diet. Make sure that you eat enough.  Be active. Do strength training. A physical therapist can help to set up an exercise program that fits you.  Make sure that you are safe at home.  Make sure that you have a plan for what to do if you cannot make decisions for yourself. Contact a doctor if:  You are not able to eat well.  You are not able to move around.  You feel very sad.  You feel very hopeless. Get help right away if:  You think about ending your life.  You cannot eat or drink.  You do not get out of bed.  Staying at home is not safe.  You have a fever. This information is not intended to replace advice given to you by your health care provider. Make sure you discuss any questions you have with your health care provider. Document Released: 03/12/2011 Document Revised: 08/29/2015 Document Reviewed: 06/18/2014 Elsevier Interactive Patient Education  2018 [place] DASH stands for "Dietary Approaches to Stop Hypertension." The DASH eating plan is a healthy eating plan that has been shown to reduce high blood pressure (hypertension). It may also reduce your risk for type 2 diabetes, heart disease, and stroke. The DASH eating plan may also help with weight loss. What are tips for following this plan? General guidelines  Avoid  eating more than 2,300 mg (milligrams) of salt (sodium) a day. If you have hypertension, you may need to reduce your sodium intake to 1,500 mg a day.  Limit alcohol intake to no more than 1 drink a day for nonpregnant women and 2 drinks a day for men. One drink equals 12 oz of beer, 5 oz of wine, or 1 oz of hard liquor.  Work with your health care provider to maintain a healthy body weight or to lose weight. Ask what an ideal weight is for you.  Get at least 30 minutes of exercise that causes your heart to beat faster (aerobic exercise) most days of the week. Activities may include walking, swimming, or biking.  Work with your health care provider or diet and nutrition specialist (dietitian) to adjust your eating plan to your individual calorie needs. Reading food labels  Check food labels for the amount of sodium per serving. Choose foods with less than 5 percent of the Daily Value of sodium. Generally, foods with less than 300 mg of sodium per serving fit into this eating plan.  To find whole grains, look for the word "whole" as the first word in the ingredient list. Shopping  Buy products labeled as "low-sodium" or "no salt added."  Buy fresh foods. Avoid canned foods and premade or frozen meals. Cooking  Avoid adding salt when cooking. Use salt-free seasonings or herbs instead of table salt or sea salt. Check with your health  care provider or pharmacist before using salt substitutes.  Do not fry foods. Cook foods using healthy methods such as baking, boiling, grilling, and broiling instead.  Cook with heart-healthy oils, such as olive, canola, soybean, or sunflower oil. Meal planning   Eat a balanced diet that includes: ? 5 or more servings of fruits and vegetables each day. At each meal, try to fill half of your plate with fruits and vegetables. ? Up to 6-8 servings of whole grains each day. ? Less than 6 oz of lean meat, poultry, or fish each day. A 3-oz serving of meat is  about the same size as a deck of cards. One egg equals 1 oz. ? 2 servings of low-fat dairy each day. ? A serving of nuts, seeds, or beans 5 times each week. ? Heart-healthy fats. Healthy fats called Omega-3 fatty acids are found in foods such as flaxseeds and coldwater fish, like sardines, salmon, and mackerel.  Limit how much you eat of the following: ? Canned or prepackaged foods. ? Food that is high in trans fat, such as fried foods. ? Food that is high in saturated fat, such as fatty meat. ? Sweets, desserts, sugary drinks, and other foods with added sugar. ? Full-fat dairy products.  Do not salt foods before eating.  Try to eat at least 2 vegetarian meals each week.  Eat more home-cooked food and less restaurant, buffet, and fast food.  When eating at a restaurant, ask that your food be prepared with less salt or no salt, if possible. What foods are recommended? The items listed may not be a complete list. Talk with your dietitian about what dietary choices are best for you. Grains Whole-grain or whole-wheat bread. Whole-grain or whole-wheat pasta. Brown rice. [place]. Bulgur. Whole-grain and low-sodium cereals. Pita bread. Low-fat, low-sodium crackers. Whole-wheat flour tortillas. Vegetables Fresh or frozen vegetables (raw, steamed, roasted, or grilled). Low-sodium or reduced-sodium tomato and vegetable juice. Low-sodium or reduced-sodium tomato sauce and tomato paste. Low-sodium or reduced-sodium canned vegetables. Fruits All fresh, dried, or frozen fruit. Canned fruit in natural juice (without added sugar). Meat and other protein foods Skinless chicken or [place]. Ground chicken or [place]. Pork with fat trimmed off. Fish and seafood. Egg whites. Dried beans, peas, or lentils. Unsalted nuts, nut butters, and seeds. Unsalted canned beans. Lean cuts of beef with fat trimmed off. Low-sodium, lean deli meat. Dairy Low-fat (1%) or fat-free (skim) milk. Fat-free, low-fat, or  reduced-fat cheeses. Nonfat, low-sodium ricotta or cottage cheese. Low-fat or nonfat yogurt. Low-fat, low-sodium cheese. Fats and oils Soft margarine without trans fats. Vegetable oil. Low-fat, reduced-fat, or light mayonnaise and salad dressings (reduced-sodium). Canola, safflower, olive, soybean, and sunflower oils. Avocado. Seasoning and other foods Herbs. Spices. Seasoning mixes without salt. Unsalted popcorn and pretzels. Fat-free sweets. What foods are not recommended? The items listed may not be a complete list. Talk with your dietitian about what dietary choices are best for you. Grains Baked goods made with fat, such as croissants, muffins, or some breads. Dry pasta or rice meal packs. Vegetables Creamed or fried vegetables. Vegetables in a cheese sauce. Regular canned vegetables (not low-sodium or reduced-sodium). Regular canned tomato sauce and paste (not low-sodium or reduced-sodium). Regular tomato and vegetable juice (not low-sodium or reduced-sodium). Angie Fava. Olives. Fruits Canned fruit in a light or heavy syrup. Fried fruit. Fruit in cream or butter sauce. Meat and other protein foods Fatty cuts of meat. Ribs. Fried meat. Berniece Salines. Sausage. Bologna and other processed lunch meats. Salami.  Fatback. Hotdogs. Bratwurst. Salted nuts and seeds. Canned beans with added salt. Canned or smoked fish. Whole eggs or egg yolks. Chicken or [place] with skin. Dairy Whole or 2% milk, cream, and half-and-half. Whole or full-fat cream cheese. Whole-fat or sweetened yogurt. Full-fat cheese. Nondairy creamers. Whipped toppings. Processed cheese and cheese spreads. Fats and oils Butter. Stick margarine. Lard. Shortening. Ghee. Bacon fat. Tropical oils, such as coconut, palm kernel, or palm oil. Seasoning and other foods Salted popcorn and pretzels. Onion salt, garlic salt, seasoned salt, table salt, and sea salt. Worcestershire sauce. Tartar sauce. Barbecue sauce. Teriyaki sauce. Soy sauce, including  reduced-sodium. Steak sauce. Canned and packaged gravies. Fish sauce. Oyster sauce. Cocktail sauce. Horseradish that you find on the shelf. Ketchup. Mustard. Meat flavorings and tenderizers. Bouillon cubes. Hot sauce and Tabasco sauce. Premade or packaged marinades. Premade or packaged taco seasonings. Relishes. Regular salad dressings. Where to find more information:  National Heart, Lung, and [place]: https://wilson-eaton.com/  American Heart Association: www.heart.org Summary  The DASH eating plan is a healthy eating plan that has been shown to reduce high blood pressure (hypertension). It may also reduce your risk for type 2 diabetes, heart disease, and stroke.  With the DASH eating plan, you should limit salt (sodium) intake to 2,300 mg a day. If you have hypertension, you may need to reduce your sodium intake to 1,500 mg a day.  When on the DASH eating plan, aim to eat more fresh fruits and vegetables, whole grains, lean proteins, low-fat dairy, and heart-healthy fats.  Work with your health care provider or diet and nutrition specialist (dietitian) to adjust your eating plan to your individual calorie needs. This information is not intended to replace advice given to you by your health care provider. Make sure you discuss any questions you have with your health care provider. Document Released: 03/12/2011 Document Revised: 03/16/2016 Document Reviewed: 03/16/2016 Elsevier Interactive Patient Education  2018 [employer].  Hypertension Hypertension, commonly called high blood pressure, is when the force of blood pumping through the arteries is too strong. The arteries are the blood vessels that carry blood from the heart throughout the body. Hypertension forces the heart to work harder to pump blood and may cause arteries to become narrow or stiff. Having untreated or uncontrolled hypertension can cause heart attacks, strokes, kidney disease, and other problems. A blood pressure reading  consists of a higher number over a lower number. Ideally, your blood pressure should be below 120/80. The first ("top") number is called the systolic pressure. It is a measure of the pressure in your arteries as your heart beats. The second ("bottom") number is called the diastolic pressure. It is a measure of the pressure in your arteries as the heart relaxes. What are the causes? The cause of this condition is not known. What increases the risk? Some risk factors for high blood pressure are under your control. Others are not. Factors you can change  Smoking.  Having type 2 diabetes mellitus, high cholesterol, or both.  Not getting enough exercise or physical activity.  Being overweight.  Having too much fat, sugar, calories, or salt (sodium) in your diet.  Drinking too much alcohol. Factors that are difficult or impossible to change  Having chronic kidney disease.  Having a family history of high blood pressure.  Age. Risk increases with age.  Race. You may be at higher risk if you are African-American.  Gender. Men are at higher risk than women before age 36. After age 40,  women are at higher risk than men.  Having obstructive sleep apnea.  Stress. What are the signs or symptoms? Extremely high blood pressure (hypertensive crisis) may cause:  Headache.  Anxiety.  Shortness of breath.  Nosebleed.  Nausea and vomiting.  Severe chest pain.  Jerky movements you cannot control (seizures).  How is this diagnosed? This condition is diagnosed by measuring your blood pressure while you are seated, with your arm resting on a surface. The cuff of the blood pressure monitor will be placed directly against the skin of your upper arm at the level of your heart. It should be measured at least twice using the same arm. Certain conditions can cause a difference in blood pressure between your right and left arms. Certain factors can cause blood pressure readings to be lower or  higher than normal (elevated) for a short period of time:  When your blood pressure is higher when you are in a health care provider's office than when you are at home, this is called white coat hypertension. Most people with this condition do not need medicines.  When your blood pressure is higher at home than when you are in a health care provider's office, this is called masked hypertension. Most people with this condition may need medicines to control blood pressure.  If you have a high blood pressure reading during one visit or you have normal blood pressure with other risk factors:  You may be asked to return on a different day to have your blood pressure checked again.  You may be asked to monitor your blood pressure at home for 1 week or longer.  If you are diagnosed with hypertension, you may have other blood or imaging tests to help your health care provider understand your overall risk for other conditions. How is this treated? This condition is treated by making healthy lifestyle changes, such as eating healthy foods, exercising more, and reducing your alcohol intake. Your health care provider may prescribe medicine if lifestyle changes are not enough to get your blood pressure under control, and if:  Your systolic blood pressure is above 130.  Your diastolic blood pressure is above 80.  Your personal target blood pressure may vary depending on your medical conditions, your age, and other factors. Follow these instructions at home: Eating and drinking  Eat a diet that is high in fiber and potassium, and low in sodium, added sugar, and fat. An example eating plan is called the DASH (Dietary Approaches to Stop Hypertension) diet. To eat this way: ? Eat plenty of fresh fruits and vegetables. Try to fill half of your plate at each meal with fruits and vegetables. ? Eat whole grains, such as whole wheat pasta, brown rice, or whole grain bread. Fill about one quarter of your plate  with whole grains. ? Eat or drink low-fat dairy products, such as skim milk or low-fat yogurt. ? Avoid fatty cuts of meat, processed or cured meats, and poultry with skin. Fill about one quarter of your plate with lean proteins, such as fish, chicken without skin, beans, eggs, and tofu. ? Avoid premade and processed foods. These tend to be higher in sodium, added sugar, and fat.  Reduce your daily sodium intake. Most people with hypertension should eat less than 1,500 mg of sodium a day.  Limit alcohol intake to no more than 1 drink a day for nonpregnant women and 2 drinks a day for men. One drink equals 12 oz of beer, 5 oz of wine,  or 1 oz of hard liquor. Lifestyle  Work with your health care provider to maintain a healthy body weight or to lose weight. Ask what an ideal weight is for you.  Get at least 30 minutes of exercise that causes your heart to beat faster (aerobic exercise) most days of the week. Activities may include walking, swimming, or biking.  Include exercise to strengthen your muscles (resistance exercise), such as pilates or lifting weights, as part of your weekly exercise routine. Try to do these types of exercises for 30 minutes at least 3 days a week.  Do not use any products that contain nicotine or tobacco, such as cigarettes and e-cigarettes. If you need help quitting, ask your health care provider.  Monitor your blood pressure at home as told by your health care provider.  Keep all follow-up visits as told by your health care provider. This is important. Medicines  Take over-the-counter and prescription medicines only as told by your health care provider. Follow directions carefully. Blood pressure medicines must be taken as prescribed.  Do not skip doses of blood pressure medicine. Doing this puts you at risk for problems and can make the medicine less effective.  Ask your health care provider about side effects or reactions to medicines that you should watch  for. Contact a health care provider if:  You think you are having a reaction to a medicine you are taking.  You have headaches that keep coming back (recurring).  You feel dizzy.  You have swelling in your ankles.  You have trouble with your vision. Get help right away if:  You develop a severe headache or confusion.  You have unusual weakness or numbness.  You feel faint.  You have severe pain in your chest or abdomen.  You vomit repeatedly.  You have trouble breathing. Summary  Hypertension is when the force of blood pumping through your arteries is too strong. If this condition is not controlled, it may put you at risk for serious complications.  Your personal target blood pressure may vary depending on your medical conditions, your age, and other factors. For most people, a normal blood pressure is less than 120/80.  Hypertension is treated with lifestyle changes, medicines, or a combination of both. Lifestyle changes include weight loss, eating a healthy, low-sodium diet, exercising more, and limiting alcohol. This information is not intended to replace advice given to you by your health care provider. Make sure you discuss any questions you have with your health care provider. Document Released: 03/23/2005 Document Revised: 02/19/2016 Document Reviewed: 02/19/2016 Elsevier Interactive Patient Education  [employer].

## 2018-03-10 ENCOUNTER — Ambulatory Visit: Payer: Medicare Other | Admitting: Internal Medicine

## 2018-03-23 ENCOUNTER — Encounter: Payer: Self-pay | Admitting: Internal Medicine

## 2018-06-09 ENCOUNTER — Encounter: Payer: Self-pay | Admitting: Internal Medicine

## 2018-06-09 ENCOUNTER — Ambulatory Visit (INDEPENDENT_AMBULATORY_CARE_PROVIDER_SITE_OTHER): Payer: Medicare Other | Admitting: Internal Medicine

## 2018-06-09 VITALS — BP 144/78 | HR 104 | Temp 99.0°F | Ht 71.0 in | Wt 195.6 lb

## 2018-06-09 DIAGNOSIS — E119 Type 2 diabetes mellitus without complications: Secondary | ICD-10-CM | POA: Diagnosis not present

## 2018-06-09 DIAGNOSIS — B888 Other specified infestations: Secondary | ICD-10-CM | POA: Diagnosis not present

## 2018-06-09 DIAGNOSIS — I10 Essential (primary) hypertension: Secondary | ICD-10-CM

## 2018-06-09 DIAGNOSIS — R600 Localized edema: Secondary | ICD-10-CM | POA: Diagnosis not present

## 2018-06-09 MED ORDER — AMLODIPINE BESYLATE 2.5 MG PO TABS: 2.5000 mg | ORAL_TABLET | Freq: Every day | ORAL | 3 refills | Status: DC

## 2018-06-09 MED ORDER — AMLODIPINE BESYLATE 2.5 MG PO TABS: 2.5000 mg | ORAL_TABLET | Freq: Every day | ORAL | 3 refills | Status: AC

## 2018-06-09 NOTE — Progress Notes (Signed)
Chief Complaint  Patient presents with  . Follow-up   F/u with niece Vickie 1. HTN uncontrolled on chlorthalidone 25 mg qd, coreg 25 mg bid, micardis 80 mg qd 152/78 repeat 144/76 pt had BP meds today   2. Bed bugs 6 noted on pts shirt today he lives in house with 2 other relatives and reports he gets meals on wheels but one room in house has clothes piled up to the ceiling and he has had to call pest control for roaches in the past -someone came to the house and requested well visit with APS for patient and he may benefit from having RN visit for BP checks and medication management and home social work services to consider ALF in future while patient is getting house more intact  He is not itching today from bed bugs   3. Denies joint pain  4. Chronic leg edema improved  5. DM 2 on metformin 500 mg daily xr  Review of Systems  Constitutional: Negative for weight loss.  HENT: Negative for hearing loss.   Eyes: Negative for blurred vision.  Respiratory: Negative for shortness of breath.   Cardiovascular: Negative for chest pain and leg swelling.  Gastrointestinal: Negative for abdominal pain.  Musculoskeletal: Negative for joint pain.  Skin: Negative for itching and rash.  Neurological: Negative for headaches.  Psychiatric/Behavioral: Negative for depression.   Past Medical History:  Diagnosis Date  . Diabetes mellitus without complication ([place])   . Hyperlipidemia   . Hypertension    Past Surgical History:  Procedure Laterality Date  . APPENDECTOMY     1985  . BRAIN SURGERY  1961   removed bone from inside skull that was pressing on optic nerve  . HERNIA REPAIR     umbilical 2703   . TOE AMPUTATION     right great toe 2016   Family History  Problem Relation Age of Onset  . Colon cancer Brother   . Cancer Brother        colon   . Stroke Brother   . Heart disease Daughter        MI   Social History   Socioeconomic History  . Marital status: Widowed    Spouse  name: Not on file  . Number of children: 3  . Years of education: Not on file  . Highest education level: Not on file  Occupational History  . Not on file  Social Needs  . Financial resource strain: Not on file  . Food insecurity:    Worry: Not on file    Inability: Not on file  . Transportation needs:    Medical: Not on file    Non-medical: Not on file  Tobacco Use  . Smoking status: Former Smoker    Last attempt to quit: 1961    Years since quitting: 59.2  . Smokeless tobacco: Never Used  Substance and Sexual Activity  . Alcohol use: No  . Drug use: Never  . Sexual activity: Not Currently  Lifestyle  . Physical activity:    Days per week: Not on file    Minutes per session: Not on file  . Stress: Not on file  Relationships  . Social connections:    Talks on phone: Not on file    Gets together: Not on file    Attends religious service: Not on file    Active member of club or organization: Not on file    Attends meetings of clubs or organizations: Not on file  Relationship status: Not on file  . Intimate partner violence:    Fear of current or ex partner: Not on file    Emotionally abused: Not on file    Physically abused: Not on file    Forced sexual activity: Not on file  Other Topics Concern  . Not on file  Social History Narrative   3 kids    Niece Peggyann [place] [phone] involved in care    Owns guns, wears seat belts, safe in relationship   Retired, widower   Some high school ed    [employer] car [employer]    Current Meds  Medication Sig  . acetaminophen (TYLENOL) 500 MG tablet Take 1,000 mg by mouth 2 (two) times daily.  Marland Kitchen aspirin EC 81 MG tablet Take 1 tablet (81 mg total) by mouth daily.  . carvedilol (COREG) 25 MG tablet Take 1 tablet (25 mg total) by mouth 2 (two) times daily.  . chlorthalidone (HYGROTON) 25 MG tablet Take 1 tablet (25 mg total) by mouth daily.  . diclofenac sodium (VOLTAREN) 1 % GEL Apply 2 g topically 4 (four) times daily. B/l  knees. Can use up to 4 grams  . metFORMIN (GLUCOPHAGE-XR) 500 MG 24 hr tablet Take 1 tablet (500 mg total) by mouth daily. With food  . Omega-3 Fatty Acids (FISH OIL) 1000 MG CAPS Take 1 capsule (1,000 mg total) by mouth 2 (two) times daily.  Marland Kitchen telmisartan (MICARDIS) 80 MG tablet Take 1 tablet (80 mg total) by mouth daily.  . traMADol (ULTRAM) 50 MG tablet Take 1 tablet (50 mg total) by mouth 2 (two) times daily as needed.  . tuberculin (APLISOL) 5 UNIT/0.1ML injection Inject into the skin.   Allergies  Allergen Reactions  . 5-Alpha Reductase Inhibitors    No results found for this or any previous visit (from the past 2160 hour(s)). Objective  Body mass index is 27.28 kg/m. Wt Readings from Last 3 Encounters:  06/09/18 195 lb 9.6 oz (88.7 kg)  03/09/18 192 lb (87.1 kg)  02/10/18 205 lb 4 oz (93.1 kg)   Temp Readings from Last 3 Encounters:  06/09/18 99 F (37.2 C) (Oral)  03/09/18 98.7 F (37.1 C) (Oral)  02/10/18 98.9 F (37.2 C) (Oral)   BP Readings from Last 3 Encounters:  06/09/18 (!) 152/78  03/09/18 (!) 162/74  02/10/18 (!) 180/90   Pulse Readings from Last 3 Encounters:  06/09/18 (!) 104  03/09/18 81  02/10/18 (!) 106    Physical Exam Vitals signs and nursing note reviewed.  Constitutional:      Appearance: Normal appearance. He is well-developed, well-groomed and overweight.  HENT:     Head: Normocephalic and atraumatic.     Nose: Nose normal.     Mouth/Throat:     Mouth: Mucous membranes are moist.     Pharynx: Oropharynx is clear.  Eyes:     Conjunctiva/sclera: Conjunctivae normal.     Pupils: Pupils are equal, round, and reactive to light.  Cardiovascular:     Rate and Rhythm: Normal rate and regular rhythm.     Heart sounds: Normal heart sounds. No murmur.  Pulmonary:     Effort: Pulmonary effort is normal.     Breath sounds: Normal breath sounds.  Musculoskeletal:     Right lower leg: No edema.     Left lower leg: No edema.  Skin:    General:  Skin is warm and dry.  Neurological:     General: No focal deficit  present.     Mental Status: He is alert and oriented to person, place, and time. Mental status is at baseline.     Comments: BL walks with cane   Psychiatric:        Attention and Perception: Attention and perception normal.        Mood and Affect: Mood and affect normal.        Speech: Speech normal.        Behavior: Behavior normal. Behavior is cooperative.        Thought Content: Thought content normal.        Cognition and Memory: Cognition and memory normal.        Judgment: Judgment normal.     Assessment   1. HTN  2. DM 2  3. Bed bugs  4. Leg edema improved  5. HM Plan   1. Cont meds add norvasc 2.5 mg qd  2. Cont meds  Consider eye exam in future and foot exam  3. rec get house treated  sarna lotion prn itching  Will contact APS for wellness check Referral home social work and nurse to check #1 and help meds and check BP Consider ALF given list of 4 on AVS today   4. Cont compression socks helping  5.  Flu shot utd  Tdap had 05/07/14 pna 23 utd  prevnar had 05/07/14  Zoster had 05/07/14  -consider shingrix in future   Never had colonoscopy  Out of age window PSA Echo had   Provider: Dr. Olivia Mackie McLean-Scocuzza-[facility]

## 2018-06-09 NOTE — Patient Instructions (Addendum)
sarna lotion for itching  Get your house treat   1. [facility] [phone] 2. [facility] [phone] 3. [place]  4. [facility]   Bedbugs  Bedbugs are tiny bugs that live in and around beds. They stay hidden during the day, and they come out at night and bite. Bedbugs need blood to live and grow. Where are bedbugs found? Bedbugs can be found anywhere, whether a place is clean or dirty. They are found in places where many people come and go, such as hotels, shelters, dorms, and health care settings. It is also common for them to be found in homes where there are many birds or bats nearby. What are bedbug bites like?  A bedbug bite leaves a small red bump with a darker red dot in the middle. The bump may appear soon after a person is bitten or one or more days later. Bedbug bites usually do not hurt, but they may itch. Most people do not need treatment for bedbug bites. The bumps usually go away on their own in a few days. If you have a lot of bedbug bites and they feel very itchy:  Do not scratch the bite areas.  You may apply any of these to the bite area as told by your health care provider: ? A baking soda paste. Make this by adding water to baking soda. ? Cortisone cream. ? Calamine lotion. How do I check for bedbugs? Adult bedbugs are reddish-brown, oval, and flat. They are about as long as a grain of rice ( inch or 5-7 mm), and they cannot fly. Young bedbugs (nymphs) are smaller, and they are whitish-yellow or clear (translucent). Using a flashlight, look for bedbugs in these places:  On mattresses, bed frames, headboards, and box springs.  On drapes and curtains in bedrooms.  Under carpeting in bedrooms.  Behind electrical outlets.  Behind any wallpaper that is peeling.  Inside luggage. Also look for black or red spots or stains on or near the bed. Stains can come from bedbugs that have been crushed or from bedbug waste. What should I do if I find  bedbugs? When traveling If you find bedbugs while traveling, check all of your possessions carefully before you bring them into your home. Consider throwing away anything that has bedbugs on it. At home If you find bedbugs at home, your bedroom may need to be treated by a pest control expert. You may also need to throw away mattresses or luggage. To help prevent bedbugs from coming back, consider taking these actions:  Wash your clothes and bedding in water that is hotter than 120F (48.9C) and dry them on a hot setting. Bedbugs are killed by high temperatures.  Put a plastic cover over your mattress.  When sleeping, wear pajamas that have long sleeves and pant legs. Bedbugs usually bite areas of the skin that are not covered.  Vacuum often around the bed and in all of the cracks and crevices where the bugs might hide.  Carefully check all used furniture, bedding, or clothes that you bring into your home.  Eliminate bird nests and bat roosts that are near your home. Where to find more information  [employer], operational (EPA): [email or web address] Summary  Bedbugs are tiny bugs that live in and around beds.  Bedbugs are most often found in places where many people come and go, such as hotels, shelters, dorms, and health care settings.  A bedbug bite leaves a small red bump  with a darker red dot in the middle.  Bedbug bites usually do not hurt, but they may itch.  If you find bedbugs at home, your bedroom may need to be treated by a pest control expert. This information is not intended to replace advice given to you by your health care provider. Make sure you discuss any questions you have with your health care provider. Document Released: 04/25/2010 Document Revised: 11/13/2016 Document Reviewed: 11/13/2016 Elsevier Interactive Patient Education  2019 [employer].

## 2018-06-09 NOTE — Progress Notes (Signed)
Pre visit review using our clinic review tool, if applicable. No additional management support is needed unless otherwise documented below in the visit note. 
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
## 2018-06-10 NOTE — Progress Notes (Signed)
Self care concerns

## 2018-06-15 ENCOUNTER — Telehealth: Payer: Self-pay | Admitting: Internal Medicine

## 2018-06-15 NOTE — Telephone Encounter (Signed)
Copied from [place] [phone]. Topic: Quick Communication - See Telephone Encounter >> Jun 15, 2018 11:54 AM Valla Leaver wrote: CRM for notification. See Telephone encounter for: 06/15/18. Jenny Reichmann, Publishing copy w/ [facility] calling to notify nurse that they don't go into he patient home until bedbug situations are resolved. They will check on him next week as patient has family coming who should be helping him to get rid of the bed bugs.

## 2018-06-19 NOTE — Telephone Encounter (Signed)
If patient does not get bed bug situation treated he should not be able to come back to the office as he left some bed bugs in the office   What do you all think?  If yes then we need to notify him of this    Lucas Jackson

## 2018-06-20 NOTE — Progress Notes (Signed)
Left detailed message online for Adult services to return call to office

## 2018-06-20 NOTE — Telephone Encounter (Signed)
If he does not get them treated hes at risk to bring them in each office visit and we will be calling pest control each time he comes in and also if he goes to other areas like the lab, bathrooms, etc   He needs his home treated asap   Thanks [place]

## 2018-06-20 NOTE — Telephone Encounter (Signed)
-----   Message from Delorise Jackson, MD sent at 06/10/2018  9:52 AM EST -----   ----- Message ----- From: Nanci Pina, LPN Sent: 12/12/9890   9:35 AM EST To: Nino Glow McLean-Scocuzza, MD    ----- Message ----- From: McLean-Scocuzza, Nino Glow, MD Sent: 06/09/2018   7:26 PM EST To: Nanci Pina, LPN  Pt needs APS for well check and social worker help for services considering asst. Living facility

## 2018-07-13 NOTE — Telephone Encounter (Signed)
See related message called social services waiting on return call.

## 2018-07-15 ENCOUNTER — Encounter: Payer: Self-pay | Admitting: Internal Medicine

## 2018-07-18 ENCOUNTER — Telehealth: Payer: Self-pay | Admitting: Internal Medicine

## 2018-07-18 NOTE — Telephone Encounter (Signed)
He says he is fine and will not go to ER so I am having SS go out and have visual he is ok.
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
## 2018-07-18 NOTE — Telephone Encounter (Signed)
Lucas Jackson was seen and bed bugs were identified on him at his last visit. His stepdaughter called me this morning and said that he is not eating and sleeps all the time. She said that he is covered with bedbugs. I am not sure what steps to take in the midst of this pandemic. At the last medical visit a well check visit was requested, and he was advised to go to assisted living.  Please have Dr. Olivia Mackie call me at at [phone].  Dr. Kelly Services Advise   Please call me at at [phone].   In the midst of pandemic I would rec. Taking him to the Emergency Room both you all wear a mask they have social workers that can coordinate asst. Living facilities if you are concerned with his overall well being and wellness he likely need evaluation too   Did he get bed bugs treated in the home?   [place]

## 2018-07-18 NOTE — Telephone Encounter (Signed)
If they are worried Juliann Pulse about his  Sleeping too much or eating I think taking him to the ED so their social workers can coordinate placement is the fastest way to get him out of the home as well   Thanks [place]

## 2018-07-24 ENCOUNTER — Emergency Department: Payer: Medicare Other

## 2018-07-24 ENCOUNTER — Other Ambulatory Visit: Payer: Self-pay

## 2018-07-24 ENCOUNTER — Inpatient Hospital Stay
Admission: EM | Admit: 2018-07-24 | Discharge: 2018-07-29 | DRG: 240 | Disposition: A | Discharge status: Skilled Nursing Facility | Payer: Medicare Other | Attending: Internal Medicine | Admitting: Internal Medicine

## 2018-07-24 ENCOUNTER — Encounter: Payer: Self-pay | Admitting: Internal Medicine

## 2018-07-24 DIAGNOSIS — Z87891 Personal history of nicotine dependence: Secondary | ICD-10-CM

## 2018-07-24 DIAGNOSIS — H548 Legal blindness, as defined in USA: Secondary | ICD-10-CM | POA: Diagnosis not present

## 2018-07-24 DIAGNOSIS — L97519 Non-pressure chronic ulcer of other part of right foot with unspecified severity: Secondary | ICD-10-CM | POA: Diagnosis present

## 2018-07-24 DIAGNOSIS — I272 Pulmonary hypertension, unspecified: Secondary | ICD-10-CM | POA: Diagnosis present

## 2018-07-24 DIAGNOSIS — I129 Hypertensive chronic kidney disease with stage 1 through stage 4 chronic kidney disease, or unspecified chronic kidney disease: Secondary | ICD-10-CM | POA: Diagnosis present

## 2018-07-24 DIAGNOSIS — E1152 Type 2 diabetes mellitus with diabetic peripheral angiopathy with gangrene: Secondary | ICD-10-CM | POA: Diagnosis not present

## 2018-07-24 DIAGNOSIS — S91309A Unspecified open wound, unspecified foot, initial encounter: Secondary | ICD-10-CM | POA: Diagnosis present

## 2018-07-24 DIAGNOSIS — R52 Pain, unspecified: Secondary | ICD-10-CM | POA: Diagnosis not present

## 2018-07-24 DIAGNOSIS — E785 Hyperlipidemia, unspecified: Secondary | ICD-10-CM | POA: Diagnosis present

## 2018-07-24 DIAGNOSIS — K5900 Constipation, unspecified: Secondary | ICD-10-CM | POA: Diagnosis not present

## 2018-07-24 DIAGNOSIS — K760 Fatty (change of) liver, not elsewhere classified: Secondary | ICD-10-CM | POA: Diagnosis not present

## 2018-07-24 DIAGNOSIS — Z4781 Encounter for orthopedic aftercare following surgical amputation: Secondary | ICD-10-CM | POA: Diagnosis not present

## 2018-07-24 DIAGNOSIS — Z800 Family history of malignant neoplasm of digestive organs: Secondary | ICD-10-CM

## 2018-07-24 DIAGNOSIS — N189 Chronic kidney disease, unspecified: Secondary | ICD-10-CM | POA: Diagnosis not present

## 2018-07-24 DIAGNOSIS — M79604 Pain in right leg: Secondary | ICD-10-CM | POA: Diagnosis not present

## 2018-07-24 DIAGNOSIS — Z7984 Long term (current) use of oral hypoglycemic drugs: Secondary | ICD-10-CM

## 2018-07-24 DIAGNOSIS — Z89421 Acquired absence of other right toe(s): Secondary | ICD-10-CM | POA: Diagnosis not present

## 2018-07-24 DIAGNOSIS — Z79899 Other long term (current) drug therapy: Secondary | ICD-10-CM | POA: Diagnosis not present

## 2018-07-24 DIAGNOSIS — E1169 Type 2 diabetes mellitus with other specified complication: Secondary | ICD-10-CM | POA: Diagnosis present

## 2018-07-24 DIAGNOSIS — Z888 Allergy status to other drugs, medicaments and biological substances status: Secondary | ICD-10-CM

## 2018-07-24 DIAGNOSIS — E569 Vitamin deficiency, unspecified: Secondary | ICD-10-CM | POA: Diagnosis not present

## 2018-07-24 DIAGNOSIS — L97511 Non-pressure chronic ulcer of other part of right foot limited to breakdown of skin: Secondary | ICD-10-CM | POA: Diagnosis not present

## 2018-07-24 DIAGNOSIS — I350 Nonrheumatic aortic (valve) stenosis: Secondary | ICD-10-CM | POA: Diagnosis not present

## 2018-07-24 DIAGNOSIS — B888 Other specified infestations: Secondary | ICD-10-CM | POA: Diagnosis present

## 2018-07-24 DIAGNOSIS — I70268 Atherosclerosis of native arteries of extremities with gangrene, other extremity: Secondary | ICD-10-CM | POA: Diagnosis present

## 2018-07-24 DIAGNOSIS — I10 Essential (primary) hypertension: Secondary | ICD-10-CM | POA: Diagnosis not present

## 2018-07-24 DIAGNOSIS — I96 Gangrene, not elsewhere classified: Secondary | ICD-10-CM | POA: Diagnosis not present

## 2018-07-24 DIAGNOSIS — L98499 Non-pressure chronic ulcer of skin of other sites with unspecified severity: Secondary | ICD-10-CM | POA: Diagnosis present

## 2018-07-24 DIAGNOSIS — Z823 Family history of stroke: Secondary | ICD-10-CM | POA: Diagnosis not present

## 2018-07-24 DIAGNOSIS — M86171 Other acute osteomyelitis, right ankle and foot: Secondary | ICD-10-CM | POA: Diagnosis not present

## 2018-07-24 DIAGNOSIS — E11621 Type 2 diabetes mellitus with foot ulcer: Secondary | ICD-10-CM | POA: Diagnosis not present

## 2018-07-24 DIAGNOSIS — I70261 Atherosclerosis of native arteries of extremities with gangrene, right leg: Secondary | ICD-10-CM | POA: Diagnosis not present

## 2018-07-24 DIAGNOSIS — Z89411 Acquired absence of right great toe: Secondary | ICD-10-CM | POA: Diagnosis not present

## 2018-07-24 DIAGNOSIS — R609 Edema, unspecified: Secondary | ICD-10-CM | POA: Diagnosis not present

## 2018-07-24 DIAGNOSIS — Z1159 Encounter for screening for other viral diseases: Secondary | ICD-10-CM

## 2018-07-24 DIAGNOSIS — E1140 Type 2 diabetes mellitus with diabetic neuropathy, unspecified: Secondary | ICD-10-CM | POA: Diagnosis not present

## 2018-07-24 DIAGNOSIS — J9811 Atelectasis: Secondary | ICD-10-CM | POA: Diagnosis not present

## 2018-07-24 DIAGNOSIS — Z9862 Peripheral vascular angioplasty status: Secondary | ICD-10-CM | POA: Diagnosis not present

## 2018-07-24 DIAGNOSIS — N179 Acute kidney failure, unspecified: Secondary | ICD-10-CM | POA: Diagnosis not present

## 2018-07-24 DIAGNOSIS — Z7982 Long term (current) use of aspirin: Secondary | ICD-10-CM

## 2018-07-24 DIAGNOSIS — R2241 Localized swelling, mass and lump, right lower limb: Secondary | ICD-10-CM | POA: Diagnosis not present

## 2018-07-24 DIAGNOSIS — R5381 Other malaise: Secondary | ICD-10-CM | POA: Diagnosis not present

## 2018-07-24 DIAGNOSIS — E1151 Type 2 diabetes mellitus with diabetic peripheral angiopathy without gangrene: Secondary | ICD-10-CM | POA: Diagnosis not present

## 2018-07-24 DIAGNOSIS — I701 Atherosclerosis of renal artery: Secondary | ICD-10-CM | POA: Diagnosis present

## 2018-07-24 DIAGNOSIS — M6281 Muscle weakness (generalized): Secondary | ICD-10-CM | POA: Diagnosis not present

## 2018-07-24 DIAGNOSIS — B9561 Methicillin susceptible Staphylococcus aureus infection as the cause of diseases classified elsewhere: Secondary | ICD-10-CM | POA: Diagnosis not present

## 2018-07-24 DIAGNOSIS — M869 Osteomyelitis, unspecified: Secondary | ICD-10-CM | POA: Diagnosis not present

## 2018-07-24 DIAGNOSIS — E1122 Type 2 diabetes mellitus with diabetic chronic kidney disease: Secondary | ICD-10-CM | POA: Diagnosis present

## 2018-07-24 DIAGNOSIS — R509 Fever, unspecified: Secondary | ICD-10-CM | POA: Diagnosis not present

## 2018-07-24 DIAGNOSIS — L97919 Non-pressure chronic ulcer of unspecified part of right lower leg with unspecified severity: Secondary | ICD-10-CM | POA: Diagnosis not present

## 2018-07-24 DIAGNOSIS — Z7401 Bed confinement status: Secondary | ICD-10-CM | POA: Diagnosis not present

## 2018-07-24 DIAGNOSIS — E119 Type 2 diabetes mellitus without complications: Secondary | ICD-10-CM | POA: Diagnosis not present

## 2018-07-24 DIAGNOSIS — M2550 Pain in unspecified joint: Secondary | ICD-10-CM | POA: Diagnosis not present

## 2018-07-24 HISTORY — DX: Osteomyelitis, unspecified: M86.9

## 2018-07-24 LAB — URINALYSIS, COMPLETE (UACMP) WITH MICROSCOPIC
Bacteria, UA: NONE SEEN
Bilirubin Urine: NEGATIVE
Glucose, UA: NEGATIVE mg/dL
Ketones, ur: NEGATIVE mg/dL
Leukocytes,Ua: NEGATIVE
Nitrite: NEGATIVE
Protein, ur: NEGATIVE mg/dL
Specific Gravity, Urine: 1.016 (ref 1.005–1.030)
pH: 5 (ref 5.0–8.0)

## 2018-07-24 LAB — LACTIC ACID, PLASMA
Lactic Acid, Venous: 2.4 mmol/L (ref 0.5–1.9)
Lactic Acid, Venous: 2 mmol/L (ref 0.5–1.9)

## 2018-07-24 LAB — COMPREHENSIVE METABOLIC PANEL
ALT: 19 U/L (ref 0–44)
AST: 25 U/L (ref 15–41)
Albumin: 3.1 g/dL — ABNORMAL LOW (ref 3.5–5.0)
Alkaline Phosphatase: 85 U/L (ref 38–126)
Anion gap: 11 (ref 5–15)
BUN: 44 mg/dL — ABNORMAL HIGH (ref 8–23)
CO2: 25 mmol/L (ref 22–32)
Calcium: 9 mg/dL (ref 8.9–10.3)
Chloride: 99 mmol/L (ref 98–111)
Creatinine, Ser: 1.68 mg/dL — ABNORMAL HIGH (ref 0.61–1.24)
GFR calc Af Amer: 41 mL/min — ABNORMAL LOW (ref >60)
GFR calc non Af Amer: 36 mL/min — ABNORMAL LOW (ref >60)
Glucose, Bld: 146 mg/dL — ABNORMAL HIGH (ref 70–99)
Potassium: 4 mmol/L (ref 3.5–5.1)
Sodium: 135 mmol/L (ref 135–145)
Total Bilirubin: 1 mg/dL (ref 0.3–1.2)
Total Protein: 8.5 g/dL — ABNORMAL HIGH (ref 6.5–8.1)

## 2018-07-24 LAB — SEDIMENTATION RATE: Sed Rate: >140 mm/hr — ABNORMAL HIGH (ref 0–20)

## 2018-07-24 LAB — CBC WITH DIFFERENTIAL/PLATELET
Abs Immature Granulocytes: 0.06 10*3/uL (ref 0.00–0.07)
Basophils Absolute: 0 10*3/uL (ref 0.0–0.1)
Basophils Relative: 0 %
Eosinophils Absolute: 0 10*3/uL (ref 0.0–0.5)
Eosinophils Relative: 0 %
HCT: 29.4 % — ABNORMAL LOW (ref 39.0–52.0)
Hemoglobin: 9.7 g/dL — ABNORMAL LOW (ref 13.0–17.0)
Immature Granulocytes: 1 %
Lymphocytes Relative: 7 %
Lymphs Abs: 0.8 10*3/uL (ref 0.7–4.0)
MCH: 24.9 pg — ABNORMAL LOW (ref 26.0–34.0)
MCHC: 33 g/dL (ref 30.0–36.0)
MCV: 75.4 fL — ABNORMAL LOW (ref 80.0–100.0)
Monocytes Absolute: 0.6 10*3/uL (ref 0.1–1.0)
Monocytes Relative: 6 %
Neutro Abs: 9.4 10*3/uL — ABNORMAL HIGH (ref 1.7–7.7)
Neutrophils Relative %: 86 %
Platelets: 340 10*3/uL (ref 150–400)
RBC: 3.9 MIL/uL — ABNORMAL LOW (ref 4.22–5.81)
RDW: 14.5 % (ref 11.5–15.5)
WBC: 10.8 10*3/uL — ABNORMAL HIGH (ref 4.0–10.5)
nRBC: 0 % (ref 0.0–0.2)

## 2018-07-24 LAB — TROPONIN I: Troponin I: <0.03 ng/mL (ref <0.03)

## 2018-07-24 LAB — BLOOD GAS, VENOUS
Acid-Base Excess: 3 mmol/L — ABNORMAL HIGH (ref 0.0–2.0)
Bicarbonate: 28.5 mmol/L — ABNORMAL HIGH (ref 20.0–28.0)
FIO2: 0.21
O2 Saturation: 42.7 %
Patient temperature: 37
pCO2, Ven: 46 mmHg (ref 44.0–60.0)
pH, Ven: 7.4 (ref 7.250–7.430)
pO2, Ven: <31 mmHg — CL (ref 32.0–45.0)

## 2018-07-24 LAB — GLUCOSE, CAPILLARY: Glucose-Capillary: 134 mg/dL — ABNORMAL HIGH (ref 70–99)

## 2018-07-24 MED ORDER — PIPERACILLIN-TAZOBACTAM 3.375 G IVPB 30 MIN
3.3750 g | Freq: Once | INTRAVENOUS | Status: AC
  Administered 2018-07-24: 16:00:00 3.375 g via INTRAVENOUS
  Filled 2018-07-24: qty 50

## 2018-07-24 MED ORDER — METFORMIN HCL ER 500 MG PO TB24
500.0000 mg | ORAL_TABLET | Freq: Every day | ORAL | Status: DC
  Filled 2018-07-24: qty 1

## 2018-07-24 MED ORDER — PIPERACILLIN-TAZOBACTAM 3.375 G IVPB
3.3750 g | Freq: Three times a day (TID) | INTRAVENOUS | Status: DC
  Administered 2018-07-24 – 2018-07-28 (×11): 3.375 g via INTRAVENOUS
  Filled 2018-07-24 (×11): qty 50

## 2018-07-24 MED ORDER — HYDROCODONE-ACETAMINOPHEN 5-325 MG PO TABS: 1.0000 | ORAL_TABLET | ORAL | Status: DC | PRN

## 2018-07-24 MED ORDER — SODIUM CHLORIDE 0.9% FLUSH
3.0000 mL | Freq: Two times a day (BID) | INTRAVENOUS | Status: DC
  Administered 2018-07-24 – 2018-07-26 (×3): 3 mL via INTRAVENOUS

## 2018-07-24 MED ORDER — ONDANSETRON HCL 4 MG PO TABS: 4.0000 mg | ORAL_TABLET | Freq: Four times a day (QID) | ORAL | Status: DC | PRN

## 2018-07-24 MED ORDER — CARVEDILOL 25 MG PO TABS
25.0000 mg | ORAL_TABLET | Freq: Two times a day (BID) | ORAL | Status: DC
  Administered 2018-07-24 – 2018-07-29 (×9): 25 mg via ORAL
  Filled 2018-07-24 (×9): qty 1

## 2018-07-24 MED ORDER — AMLODIPINE BESYLATE 5 MG PO TABS
2.5000 mg | ORAL_TABLET | Freq: Every day | ORAL | Status: DC
  Administered 2018-07-25 – 2018-07-29 (×3): 2.5 mg via ORAL
  Filled 2018-07-24 (×3): qty 1

## 2018-07-24 MED ORDER — SODIUM CHLORIDE 0.9% FLUSH: 3.0000 mL | INTRAVENOUS | Status: DC | PRN

## 2018-07-24 MED ORDER — ONDANSETRON HCL 4 MG/2ML IJ SOLN: 4.0000 mg | Freq: Four times a day (QID) | INTRAMUSCULAR | Status: DC | PRN

## 2018-07-24 MED ORDER — ACETAMINOPHEN 325 MG PO TABS
650.0000 mg | ORAL_TABLET | Freq: Four times a day (QID) | ORAL | Status: DC | PRN
  Administered 2018-07-25 – 2018-07-28 (×3): 650 mg via ORAL
  Filled 2018-07-24 (×3): qty 2

## 2018-07-24 MED ORDER — ACETAMINOPHEN 650 MG RE SUPP: 650.0000 mg | Freq: Four times a day (QID) | RECTAL | Status: DC | PRN

## 2018-07-24 MED ORDER — INSULIN ASPART 100 UNIT/ML ~~LOC~~ SOLN
3.0000 [IU] | Freq: Three times a day (TID) | SUBCUTANEOUS | Status: DC
  Administered 2018-07-25 – 2018-07-29 (×6): 3 [IU] via SUBCUTANEOUS
  Filled 2018-07-24 (×6): qty 1

## 2018-07-24 MED ORDER — INSULIN ASPART 100 UNIT/ML ~~LOC~~ SOLN: 0.0000 [IU] | Freq: Every day | SUBCUTANEOUS | Status: DC

## 2018-07-24 MED ORDER — VANCOMYCIN HCL 10 G IV SOLR
1500.0000 mg | INTRAVENOUS | Status: DC
  Administered 2018-07-26 – 2018-07-27 (×2): 1500 mg via INTRAVENOUS
  Filled 2018-07-24 (×2): qty 1500

## 2018-07-24 MED ORDER — ENOXAPARIN SODIUM 40 MG/0.4ML ~~LOC~~ SOLN
40.0000 mg | SUBCUTANEOUS | Status: DC
  Administered 2018-07-24 – 2018-07-28 (×5): 40 mg via SUBCUTANEOUS
  Filled 2018-07-24 (×5): qty 0.4

## 2018-07-24 MED ORDER — SODIUM CHLORIDE 0.9 % IV SOLN
INTRAVENOUS | Status: DC
  Administered 2018-07-24 – 2018-07-26 (×3): via INTRAVENOUS

## 2018-07-24 MED ORDER — SODIUM CHLORIDE 0.9 % IV SOLN
250.0000 mL | INTRAVENOUS | Status: DC | PRN
  Administered 2018-07-26: 10:00:00 via INTRAVENOUS

## 2018-07-24 MED ORDER — SENNOSIDES-DOCUSATE SODIUM 8.6-50 MG PO TABS: 1.0000 | ORAL_TABLET | Freq: Every evening | ORAL | Status: DC | PRN

## 2018-07-24 MED ORDER — IRBESARTAN 75 MG PO TABS
75.0000 mg | ORAL_TABLET | Freq: Every day | ORAL | Status: DC
  Filled 2018-07-24: qty 1

## 2018-07-24 MED ORDER — VANCOMYCIN HCL IN DEXTROSE 1-5 GM/200ML-% IV SOLN
1000.0000 mg | INTRAVENOUS | Status: AC
  Administered 2018-07-24: 18:00:00 1000 mg via INTRAVENOUS
  Filled 2018-07-24: qty 200

## 2018-07-24 MED ORDER — OMEGA-3-ACID ETHYL ESTERS 1 G PO CAPS
1.0000 g | ORAL_CAPSULE | Freq: Every day | ORAL | Status: DC
  Administered 2018-07-25 – 2018-07-29 (×3): 1 g via ORAL
  Filled 2018-07-24 (×3): qty 1

## 2018-07-24 MED ORDER — INSULIN ASPART 100 UNIT/ML ~~LOC~~ SOLN
0.0000 [IU] | Freq: Three times a day (TID) | SUBCUTANEOUS | Status: DC
  Administered 2018-07-25 – 2018-07-26 (×2): 2 [IU] via SUBCUTANEOUS
  Administered 2018-07-28: 3 [IU] via SUBCUTANEOUS
  Administered 2018-07-28 – 2018-07-29 (×2): 2 [IU] via SUBCUTANEOUS
  Filled 2018-07-24 (×5): qty 1

## 2018-07-24 MED ORDER — VANCOMYCIN HCL IN DEXTROSE 1-5 GM/200ML-% IV SOLN
1000.0000 mg | Freq: Once | INTRAVENOUS | Status: AC
  Administered 2018-07-24: 17:00:00 1000 mg via INTRAVENOUS
  Filled 2018-07-24: qty 200

## 2018-07-24 NOTE — ED Provider Notes (Signed)
[facility] Emergency Department Provider Note       Time seen: ----------------------------------------- 3:45 PM on 07/24/2018 -----------------------------------------   I have reviewed the triage vital signs and the nursing notes.  HISTORY   Chief Complaint Toe Pain    HPI Lucas Jackson is a 85 y.o. male with a history of diabetes, hyperlipidemia and hypertension, right great toe amputation who presents to the ED for severe right leg pain and toe pain.  He has a history of type 2 diabetes.  Vital signs were unremarkable in route although he was found to be febrile.  Patient states he has been having leg pain for the past 3 weeks.  He denied fever, chills, chest pain, shortness of breath, vomiting or diarrhea.  He was covered in bedbugs.  Past Medical History:  Diagnosis Date  . Diabetes mellitus without complication ([place])   . Hyperlipidemia   . Hypertension     Patient Active Problem List   Diagnosis Date Noted  . Infestation by bed bug 06/09/2018  . Fatty liver 10/05/2017  . Aortic stenosis 10/05/2017  . Aortic regurgitation 10/05/2017  . Pulmonary HTN ([place]) 10/05/2017  . Cervical stenosis of spine 08/06/2017  . HTN (hypertension) 08/05/2017  . HLD (hyperlipidemia) 08/05/2017  . Type 2 diabetes mellitus without complications ([place]) 16/01/9603  . Meningioma ([place]) 08/05/2017  . Bilateral leg edema 08/05/2017  . Cardiac murmur 08/05/2017    Past Surgical History:  Procedure Laterality Date  . APPENDECTOMY     1985  . BRAIN SURGERY  1961   removed bone from inside skull that was pressing on optic nerve  . HERNIA REPAIR     umbilical 5409   . TOE AMPUTATION     right great toe 2016    Allergies 5-alpha reductase inhibitors  Social History Social History   Tobacco Use  . Smoking status: Former Smoker    Last attempt to quit: 1961    Years since quitting: 59.3  . Smokeless tobacco: Never Used  Substance Use Topics  . Alcohol  use: No  . Drug use: Never   Review of Systems Constitutional: Positive for fever Cardiovascular: Negative for chest pain. Respiratory: Negative for shortness of breath. Gastrointestinal: Negative for abdominal pain, vomiting and diarrhea. Musculoskeletal: Positive for right leg pain Skin: Positive for drainage from the right foot Neurological: Negative for headaches, focal weakness or numbness.  All systems negative/normal/unremarkable except as stated in the HPI  ____________________________________________   PHYSICAL EXAM:  VITAL SIGNS: ED Triage Vitals  Enc Vitals Group     BP      Pulse      Resp      Temp      Temp src      SpO2      Weight      Height      Head Circumference      Peak Flow      Pain Score      Pain Loc      Pain Edu?      Excl. in [place]?    Constitutional: Alert and oriented.  Chronically ill-appearing, no distress Eyes: Conjunctivae are normal. Normal extraocular movements. Cardiovascular: Normal rate, regular rhythm. No murmurs, rubs, or gallops. Respiratory: Normal respiratory effort without tachypnea nor retractions. Breath sounds are clear and equal bilaterally. No wheezes/rales/rhonchi. Gastrointestinal: Soft and nontender. Normal bowel sounds Musculoskeletal: Mild tenderness of the right foot and ankle, right distal lower extremity swelling as well.  Mild erythema, chronic  appearing scars are present from likely diabetic ulceration.  Between the second and third digits there is dried blood, foul smell coming from the toes.  Foot is warm with good perfusion Neurologic:  Normal speech and language. No gross focal neurologic deficits are appreciated.  Skin: Skin is warm, skin changes of the right lower extremity and foot as described above Psychiatric: Mood and affect are normal. Speech and behavior are normal.  ____________________________________________  ED COURSE:  As part of my medical decision making, I reviewed the following data  within the [place] History obtained from family if available, nursing notes, old chart and ekg, as well as notes from prior ED visits. Patient presented for right leg and toe pain, we will assess with labs and imaging as indicated at this time.   Procedures  Lucas Jackson was evaluated in Emergency Department on 07/24/2018 for the symptoms described in the history of present illness. He was evaluated in the context of the global COVID-19 pandemic, which necessitated consideration that the patient might be at risk for infection with the SARS-CoV-2 virus that causes COVID-19. Institutional protocols and algorithms that pertain to the evaluation of patients at risk for COVID-19 are in a state of rapid change based on information released by regulatory bodies including the CDC and federal and state organizations. These policies and algorithms were followed during the patient's care in the ED.  ____________________________________________   LABS (pertinent positives/negatives)  Labs Reviewed  LACTIC ACID, PLASMA - Abnormal; Notable for the following components:      Result Value   Lactic Acid, Venous 2.4 (*)    All other components within normal limits  COMPREHENSIVE METABOLIC PANEL - Abnormal; Notable for the following components:   Glucose, Bld 146 (*)    BUN 44 (*)    Creatinine, Ser 1.68 (*)    Total Protein 8.5 (*)    Albumin 3.1 (*)    GFR calc non Af Amer 36 (*)    GFR calc Af Amer 41 (*)    All other components within normal limits  CBC WITH DIFFERENTIAL/PLATELET - Abnormal; Notable for the following components:   WBC 10.8 (*)    RBC 3.90 (*)    Hemoglobin 9.7 (*)    HCT 29.4 (*)    MCV 75.4 (*)    MCH 24.9 (*)    Neutro Abs 9.4 (*)    All other components within normal limits  BLOOD GAS, VENOUS - Abnormal; Notable for the following components:   pO2, Ven <31.0 (*)    Bicarbonate 28.5 (*)    Acid-Base Excess 3.0 (*)    All other components within normal  limits  SEDIMENTATION RATE - Abnormal; Notable for the following components:   Sed Rate >140 (*)    All other components within normal limits  CULTURE, BLOOD (ROUTINE X 2)  CULTURE, BLOOD (ROUTINE X 2)  URINE CULTURE  TROPONIN I  LACTIC ACID, PLASMA  URINALYSIS, COMPLETE (UACMP) WITH MICROSCOPIC   CRITICAL CARE Performed by: Laurence Aly   Total critical care time: 30 minutes  Critical care time was exclusive of separately billable procedures and treating other patients.  Critical care was necessary to treat or prevent imminent or life-threatening deterioration.  Critical care was time spent personally by me on the following activities: development of treatment plan with patient and/or surrogate as well as nursing, discussions with consultants, evaluation of patient's response to treatment, examination of patient, obtaining history from patient or surrogate, ordering  and performing treatments and interventions, ordering and review of laboratory studies, ordering and review of radiographic studies, pulse oximetry and re-evaluation of patient's condition.  RADIOLOGY Images were viewed by me  Right foot x-ray IMPRESSION: 1. Apparent loss of cortical bone in the DIP joint of the second toe, involving the head of the middle phalanx and base of distal phalanx. Imaging features concerning for joint infection/osteomyelitis. 2. Status post great toe amputation. Ultrasound right lower extremity is pending at this time  ____________________________________________   DIFFERENTIAL DIAGNOSIS   Cellulitis, sepsis, infestation of bedbugs, DVT, osteomyelitis  FINAL ASSESSMENT AND PLAN  Osteomyelitis, possible sepsis   Plan: The patient had presented for right leg and toe pain. Patient's labs did reveal some leukocytosis with a lactic acidosis and chronic kidney disease. Patient's imaging revealed a likely second toe osteomyelitis as suspected.  Clinically he has foul-smelling  drainage coming from the second toe and possibly the third toe on the right foot.  We have ordered broad-spectrum antibiotics as well as cultures.  I will discuss with the hospitalist for admission.   Laurence Aly, MD    Note: This note was generated in part or whole with voice recognition software. Voice recognition is usually quite accurate but there are transcription errors that can and very often do occur. I apologize for any typographical errors that were not detected and corrected.     Earleen Newport, MD 07/24/18 [phone]

## 2018-07-24 NOTE — ED Notes (Signed)
Awake and alert.  Oriented x 2.  To floor.

## 2018-07-24 NOTE — ED Notes (Signed)
ED TO INPATIENT HANDOFF REPORT  ED Nurse Name and Phone #: Tessie Fass [phone]  S Name/Age/Gender Lucas Jackson 85 y.o. male Room/Bed: ED12A/ED12A  Code Status   Code Status: Not on file  Home/SNF/Other Home Patient oriented to: self, place Is this baseline? unknown  Triage Complete: Triage complete  Chief Complaint leg and toe pain  Triage Note Patient came in via [facility] . Reports severe right leg and toe pain. He is a type 2 diabetes.  218 cbg 98 hr  150/70    Allergies Allergies  Allergen Reactions  . 5-Alpha Reductase Inhibitors     Level of Care/Admitting Diagnosis ED Disposition    ED Disposition Condition [facility] Area: [place] [[id number]]  Level of Care: Med-Surg [16]  Covid Evaluation: N/A  Diagnosis: Osteomyelitis [facility]) [[id number]]  Admitting Physician: Saundra Shelling [[id number]]  Attending Physician: Saundra Shelling [[id number]]  Estimated length of stay: past midnight tomorrow  Certification:: I certify this patient will need inpatient services for at least 2 midnights  PT Class (Do Not Modify): Inpatient [101]  PT Acc Code (Do Not Modify): Private [1]       B Medical/Surgery History Past Medical History:  Diagnosis Date  . Diabetes mellitus without complication ([place])   . Hyperlipidemia   . Hypertension   . Osteomyelitis [facility]    Past Surgical History:  Procedure Laterality Date  . APPENDECTOMY     1985  . BRAIN SURGERY  1961   removed bone from inside skull that was pressing on optic nerve  . HERNIA REPAIR     umbilical 2993   . TOE AMPUTATION     right great toe 2016     A IV Location/Drains/Wounds Patient Lines/Drains/Airways Status   Active Line/Drains/Airways    Name:   Placement date:   Placement time:   Site:   Days:   Peripheral IV 07/24/18 Right Antecubital   07/24/18    1620    Antecubital   less than 1          Intake/Output Last 24 hours  Intake/Output Summary (Last 24 hours)  at 07/24/2018 1748 Last data filed at 07/24/2018 1701 Gross per 24 hour  Intake 50 ml  Output -  Net 50 ml    Labs/Imaging Results for orders placed or performed during the hospital encounter of 07/24/18 (from the past 48 hour(s))  Blood gas, venous ([facility], [facility], [facility])     Status: Abnormal   Collection Time: 07/24/18  3:45 PM  Result Value Ref Range   FIO2 0.21    pH, Ven 7.40 7.250 - 7.430   pCO2, Ven 46 44.0 - 60.0 mmHg   pO2, Ven <31.0 (LL) 32.0 - 45.0 mmHg    Comment: CRITICAL RESULT CALLED TO, READ BACK BY AND VERIFIED WITH: IZZY RN AT 1654 ON 07/24/18 BL    Bicarbonate 28.5 (H) 20.0 - 28.0 mmol/L   Acid-Base Excess 3.0 (H) 0.0 - 2.0 mmol/L   O2 Saturation 42.7 %   Patient temperature 37.0    Collection site VENOUS    Sample type VENOUS     Comment: Performed at [facility], [address] [place], [place] [address]  Lactic acid, plasma     Status: Abnormal   Collection Time: 07/24/18  4:12 PM  Result Value Ref Range   Lactic Acid, Venous 2.4 (HH) 0.5 - 1.9 mmol/L    Comment: CRITICAL RESULT CALLED TO, READ BACK BY AND VERIFIED WITH ISRAA  MOHAMED AT 1700 07/24/2018.PMF Performed at [facility], [address] [place], [place] [address]   Comprehensive metabolic panel     Status: Abnormal   Collection Time: 07/24/18  4:12 PM  Result Value Ref Range   Sodium 135 135 - 145 mmol/L   Potassium 4.0 3.5 - 5.1 mmol/L   Chloride 99 98 - 111 mmol/L   CO2 25 22 - 32 mmol/L   Glucose, Bld 146 (H) 70 - 99 mg/dL   BUN 44 (H) 8 - 23 mg/dL   Creatinine, Ser 1.68 (H) 0.61 - 1.24 mg/dL   Calcium 9.0 8.9 - 10.3 mg/dL   Total Protein 8.5 (H) 6.5 - 8.1 g/dL   Albumin 3.1 (L) 3.5 - 5.0 g/dL   AST 25 15 - 41 U/L   ALT 19 0 - 44 U/L   Alkaline Phosphatase 85 38 - 126 U/L   Total Bilirubin 1.0 0.3 - 1.2 mg/dL   GFR calc non Af Amer 36 (L) >60 mL/min   GFR calc Af Amer 41 (L) >60 mL/min   Anion gap 11 5 - 15    Comment: Performed at [facility], [address] [place], [place] [address]  CBC WITH DIFFERENTIAL     Status: Abnormal   Collection Time: 07/24/18  4:12 PM  Result Value Ref Range   WBC 10.8 (H) 4.0 - 10.5 K/uL   RBC 3.90 (L) 4.22 - 5.81 MIL/uL   Hemoglobin 9.7 (L) 13.0 - 17.0 g/dL   HCT 29.4 (L) 39.0 - 52.0 %   MCV 75.4 (L) 80.0 - 100.0 fL   MCH 24.9 (L) 26.0 - 34.0 pg   MCHC 33.0 30.0 - 36.0 g/dL   RDW 14.5 11.5 - 15.5 %   Platelets 340 150 - 400 K/uL   nRBC 0.0 0.0 - 0.2 %   Neutrophils Relative % 86 %   Neutro Abs 9.4 (H) 1.7 - 7.7 K/uL   Lymphocytes Relative 7 %   Lymphs Abs 0.8 0.7 - 4.0 K/uL   Monocytes Relative 6 %   Monocytes Absolute 0.6 0.1 - 1.0 K/uL   Eosinophils Relative 0 %   Eosinophils Absolute 0.0 0.0 - 0.5 K/uL   Basophils Relative 0 %   Basophils Absolute 0.0 0.0 - 0.1 K/uL   Immature Granulocytes 1 %   Abs Immature Granulocytes 0.06 0.00 - 0.07 K/uL    Comment: Performed at [facility], [address] [place], [place] [address]  Sedimentation rate     Status: Abnormal   Collection Time: 07/24/18  4:12 PM  Result Value Ref Range   Sed Rate >140 (H) 0 - 20 mm/hr    Comment: Performed at [facility], [address] [place], [place] [address]  Troponin I - ONCE - STAT     Status: None   Collection Time: 07/24/18  4:12 PM  Result Value Ref Range   Troponin I <0.03 <0.03 ng/mL    Comment: Performed at [facility], [address] [place], [place] [address]  Urinalysis, Complete w Microscopic     Status: Abnormal   Collection Time: 07/24/18  5:05 PM  Result Value Ref Range   Color, Urine YELLOW (A) YELLOW   APPearance CLEAR (A) CLEAR   Specific Gravity, Urine 1.016 1.005 - 1.030   pH 5.0 5.0 - 8.0   Glucose, UA NEGATIVE NEGATIVE mg/dL   Hgb urine dipstick SMALL (A) NEGATIVE   Bilirubin Urine NEGATIVE NEGATIVE   Ketones,  ur NEGATIVE NEGATIVE mg/dL   Protein, ur NEGATIVE NEGATIVE mg/dL   Nitrite NEGATIVE NEGATIVE   Leukocytes,Ua NEGATIVE NEGATIVE   RBC / HPF 0-5  0 - 5 RBC/hpf   WBC, UA 0-5 0 - 5 WBC/hpf   Bacteria, UA NONE SEEN NONE SEEN   Squamous Epithelial / LPF 0-5 0 - 5   Uric Acid Crys, UA PRESENT     Comment: Performed at [facility], [address] [place], [place] [address]   Dg Chest Port 1 View  Result Date: 07/24/2018 CLINICAL DATA:  85 year old male with fever. EXAM: PORTABLE CHEST 1 VIEW COMPARISON:  No prior chest imaging. FINDINGS: Portable AP upright view at 1545 hours. Low lung volumes with elevated right hemidiaphragm. Asymmetric streaky opacity about the right hilum. Elsewhere the lungs appear clear allowing for portable technique. Other mediastinal contours are within normal limits. Visualized tracheal air column is within normal limits. No acute osseous abnormality identified. Paucity of bowel gas in the visible upper abdomen. IMPRESSION: Low lung volumes with asymmetric right hilar and lung base opacity which could reflect atelectasis or infection. PA and lateral views of the chest would be helpful when possible. No other acute cardiopulmonary abnormality identified. Electronically Signed   By: Genevie Ann M.D.   On: 07/24/2018 16:13   Dg Foot 2 Views Right  Result Date: 07/24/2018 CLINICAL DATA:  Open wound to distal second toe. EXAM: RIGHT FOOT - 2 VIEW COMPARISON:  07/03/2014 FINDINGS: Two-view exam shows great toe amputation. Although assessment limited by positioning, there appears to be cortical loss in the DIP joint of the second toe, concerning for osteomyelitis. No other acute bony abnormality evident. IMPRESSION: 1. Apparent loss of cortical bone in the DIP joint of the second toe, involving the head of the middle phalanx and base of distal phalanx. Imaging features concerning for joint infection/osteomyelitis. 2. Status post great toe amputation. Electronically Signed   By: Misty Stanley M.D.   On: 07/24/2018 16:16    Pending Labs Unresulted Labs (From admission, onward)    Start     Ordered   07/24/18 2000   Lactic acid, plasma  Once,   STAT     07/24/18 1722   07/24/18 1545  Lactic acid, plasma  STAT Now then every 3 hours,   STAT     07/24/18 1545   07/24/18 1545  Blood Culture (routine x 2)  BLOOD CULTURE X 2,   STAT     07/24/18 1545   07/24/18 1545  Urine culture  ONCE - STAT,   STAT     07/24/18 1545   Signed and Held  CBC  (enoxaparin (LOVENOX)    CrCl >/= 30 ml/min)  Once,   R    Comments:  Baseline for enoxaparin therapy IF NOT ALREADY DRAWN.  Notify MD if PLT < 100 K.    Signed and Held   Signed and Held  Creatinine, serum  (enoxaparin (LOVENOX)    CrCl >/= 30 ml/min)  Once,   R    Comments:  Baseline for enoxaparin therapy IF NOT ALREADY DRAWN.    Signed and Held   Signed and Held  Creatinine, serum  (enoxaparin (LOVENOX)    CrCl >/= 30 ml/min)  Weekly,   R    Comments:  while on enoxaparin therapy    Signed and Held   Signed and Held  Basic metabolic panel  Tomorrow morning,   R     Signed and Held   Signed  and Held  CBC  Tomorrow morning,   R     Signed and Held          Vitals/Pain Today's Vitals   07/24/18 1545 07/24/18 1547  BP:  (!) 151/80  Pulse:  (!) 103  Temp:  (!) 101.1 F (38.4 C)  TempSrc:  Oral  SpO2:  95%  Weight: 86.2 kg   Height: 5\' 11"  (1.803 m)     Isolation Precautions No active isolations  Medications Medications  insulin aspart (novoLOG) injection 0-15 Units (has no administration in time range)  insulin aspart (novoLOG) injection 0-5 Units (has no administration in time range)  insulin aspart (novoLOG) injection 3 Units (has no administration in time range)  0.9 %  sodium chloride infusion (has no administration in time range)  piperacillin-tazobactam (ZOSYN) IVPB 3.375 g (has no administration in time range)  vancomycin (VANCOCIN) IVPB 1000 mg/200 mL premix (has no administration in time range)  vancomycin (VANCOCIN) IVPB 1000 mg/200 mL premix (1,000 mg Intravenous New Bag/Given 07/24/18 1634)  piperacillin-tazobactam (ZOSYN) IVPB  3.375 g (0 g Intravenous Stopped 07/24/18 1701)    Mobility unknown Low fall risk   Focused Assessments Cardiac Assessment Handoff:  Cardiac Rhythm: Normal sinus rhythm Lab Results  Component Value Date   TROPONINI <0.03 07/24/2018   No results found for: DDIMER Does the Patient currently have chest pain? No     R Recommendations: See Admitting Provider Note  Report given to:   Additional Notes:

## 2018-07-24 NOTE — ED Notes (Signed)
Numerous small bugs seen crawling on patient's clothing and bed linens.  Clothing removed (shirt, sweat shirt and pajama bottoms and 1 pair of socks).  Belongings placed in 2 patient belonging bags and bags each double bagged separately.  Belongings at bedside.

## 2018-07-24 NOTE — H&P (Addendum)
Lucas Jackson at [place] NAME: Lucas Jackson    MR#:  [id number]  DATE OF BIRTH:  1927-05-06  DATE OF ADMISSION:  07/24/2018  PRIMARY CARE PHYSICIAN: McLean-Scocuzza, Nino Glow, MD   REQUESTING/REFERRING PHYSICIAN:   CHIEF COMPLAINT:   Chief Complaint  Patient presents with  . Toe Pain    HISTORY OF PRESENT ILLNESS: Lucas Jackson  is a 85 y.o. male with a known history of diabetes mellitus type 2, hyperlipidemia, hypertension, osteomyelitis of the right great toe which has been amputated presented to the emergency room with pain in the second toe of the right foot along with foul-smelling discharge and pus coming out between the right foot second and third toes.  Patient has this going on for the last 3 weeks.  Was evaluated in the emergency room imaging studies showed osteomyelitis of the second toe in the right foot.  Was started on IV antibiotics broad-spectrum and hospitalist service was consulted.  No complaints of any chest pain, shortness of breath.  No cough, fever and chills.  Bedbugs were found on patients body when he presented to the emergency room.  He was put in contact precautions.  PAST MEDICAL HISTORY:   Past Medical History:  Diagnosis Date  . Diabetes mellitus without complication ([place])   . Hyperlipidemia   . Hypertension   . Osteomyelitis ([place])     PAST SURGICAL HISTORY:  Past Surgical History:  Procedure Laterality Date  . APPENDECTOMY     1985  . BRAIN SURGERY  1961   removed bone from inside skull that was pressing on optic nerve  . HERNIA REPAIR     umbilical 1448   . TOE AMPUTATION     right great toe 2016    SOCIAL HISTORY:  Social History   Tobacco Use  . Smoking status: Former Smoker    Last attempt to quit: 1961    Years since quitting: 59.3  . Smokeless tobacco: Never Used  Substance Use Topics  . Alcohol use: No    FAMILY HISTORY:  Family History  Problem Relation Age of Onset  .  Colon cancer Brother   . Cancer Brother        colon   . Stroke Brother   . Heart disease Daughter        MI    DRUG ALLERGIES:  Allergies  Allergen Reactions  . 5-Alpha Reductase Inhibitors     REVIEW OF SYSTEMS:   CONSTITUTIONAL: No fever, fatigue or weakness.  EYES: No blurred or double vision.  EARS, NOSE, AND THROAT: No tinnitus or ear pain.  RESPIRATORY: No cough, shortness of breath, wheezing or hemoptysis.  CARDIOVASCULAR: No chest pain, orthopnea, edema.  GASTROINTESTINAL: No nausea, vomiting, diarrhea or abdominal pain.  GENITOURINARY: No dysuria, hematuria.  ENDOCRINE: No polyuria, nocturia,  HEMATOLOGY: No anemia, easy bruising or bleeding SKIN: Bed bugs on skin MUSCULOSKELETAL: discharge between 2nd and 3rd toes right foot   NEUROLOGIC: No tingling, numbness, weakness.  PSYCHIATRY: No anxiety or depression.   MEDICATIONS AT HOME:  Prior to Admission medications   Medication Sig Start Date End Date Taking? Authorizing Provider  amLODipine (NORVASC) 2.5 MG tablet Take 1 tablet (2.5 mg total) by mouth daily. 06/09/18  Yes McLean-Scocuzza, Nino Glow, MD  aspirin EC 81 MG tablet Take 1 tablet (81 mg total) by mouth daily. 10/28/17  Yes McLean-Scocuzza, Nino Glow, MD  carvedilol (COREG) 25 MG tablet Take 1 tablet (25 mg total) by  mouth 2 (two) times daily. 02/10/18  Yes McLean-Scocuzza, Nino Glow, MD  chlorthalidone (HYGROTON) 25 MG tablet Take 1 tablet (25 mg total) by mouth daily. 02/10/18  Yes McLean-Scocuzza, Nino Glow, MD  metFORMIN (GLUCOPHAGE-XR) 500 MG 24 hr tablet Take 1 tablet (500 mg total) by mouth daily. With food 02/10/18  Yes McLean-Scocuzza, Nino Glow, MD  Omega-3 Fatty Acids (FISH OIL) 1000 MG CAPS Take 1 capsule (1,000 mg total) by mouth 2 (two) times daily. 02/10/18  Yes McLean-Scocuzza, Nino Glow, MD  telmisartan (MICARDIS) 80 MG tablet Take 1 tablet (80 mg total) by mouth daily. 02/10/18  Yes McLean-Scocuzza, Nino Glow, MD  acetaminophen (TYLENOL) 500 MG tablet Take  1,000 mg by mouth 2 (two) times daily.    [provider]  diclofenac sodium (VOLTAREN) 1 % GEL Apply 2 g topically 4 (four) times daily. B/l knees. Can use up to 4 grams 03/09/18   McLean-Scocuzza, Nino Glow, MD  traMADol (ULTRAM) 50 MG tablet Take 1 tablet (50 mg total) by mouth 2 (two) times daily as needed. 10/28/17   McLean-Scocuzza, Nino Glow, MD  tuberculin (APLISOL) 5 UNIT/0.1ML injection Inject into the skin.    [provider]      PHYSICAL EXAMINATION:   VITAL SIGNS: Blood pressure (!) 151/80, pulse (!) 103, temperature (!) 101.1 F (38.4 C), temperature source Oral, height 5\' 11"  (1.803 m), weight 86.2 kg, SpO2 95 %.  GENERAL:  85 y.o.-year-old patient lying in the bed with no acute distress.  EYES: Pupils equal, round, reactive to light and accommodation. No scleral icterus. Extraocular muscles intact.  HEENT: Head atraumatic, normocephalic. Oropharynx and nasopharynx clear.  NECK:  Supple, no jugular venous distention. No thyroid enlargement, no tenderness.  LUNGS: Normal breath sounds bilaterally, no wheezing, rales,rhonchi or crepitation. No use of accessory muscles of respiration.  CARDIOVASCULAR: S1, S2 normal. No murmurs, rubs, or gallops.  ABDOMEN: Soft, nontender, nondistended. Bowel sounds present. No organomegaly or mass.  EXTREMITIES: Right great toe amputated 2nd toe right foot swollen purulent discharge noted  NEUROLOGIC: Cranial nerves II through XII are intact. Muscle strength 5/5 in all extremities. Sensation intact. Gait not checked.  PSYCHIATRIC: The patient is alert and oriented x 3.  SKIN: purulent discharge between 2nd and 3rd toes right foot     LABORATORY PANEL:   CBC Recent Labs  Lab 07/24/18 1612  WBC 10.8*  HGB 9.7*  HCT 29.4*  PLT 340  MCV 75.4*  MCH 24.9*  MCHC 33.0  RDW 14.5  LYMPHSABS 0.8  MONOABS 0.6  EOSABS 0.0  BASOSABS 0.0    ------------------------------------------------------------------------------------------------------------------  Chemistries  Recent Labs  Lab 07/24/18 1612  NA 135  K 4.0  CL 99  CO2 25  GLUCOSE 146*  BUN 44*  CREATININE 1.68*  CALCIUM 9.0  AST 25  ALT 19  ALKPHOS 85  BILITOT 1.0   ------------------------------------------------------------------------------------------------------------------ estimated creatinine clearance is 32.4 mL/min (A) (by C-G formula based on SCr of 1.68 mg/dL (H)). ------------------------------------------------------------------------------------------------------------------ No results for input(s): TSH, T4TOTAL, T3FREE, THYROIDAB in the last 72 hours.  Invalid input(s): FREET3   Coagulation profile No results for input(s): INR, PROTIME in the last 168 hours. ------------------------------------------------------------------------------------------------------------------- No results for input(s): DDIMER in the last 72 hours. -------------------------------------------------------------------------------------------------------------------  Cardiac Enzymes Recent Labs  Lab 07/24/18 1612  TROPONINI <0.03   ------------------------------------------------------------------------------------------------------------------ Invalid input(s): POCBNP  ---------------------------------------------------------------------------------------------------------------  Urinalysis    Component Value Date/Time   COLORURINE YELLOW (A) 07/24/2018 1705   APPEARANCEUR CLEAR (A) 07/24/2018 1705   APPEARANCEUR  Clear 08/05/2017 1601   LABSPEC 1.016 07/24/2018 1705   PHURINE 5.0 07/24/2018 1705   GLUCOSEU NEGATIVE 07/24/2018 1705   HGBUR SMALL (A) 07/24/2018 1705   BILIRUBINUR NEGATIVE 07/24/2018 1705   BILIRUBINUR Negative 08/05/2017 1601   KETONESUR NEGATIVE 07/24/2018 1705   PROTEINUR NEGATIVE 07/24/2018 1705   NITRITE NEGATIVE 07/24/2018 1705    LEUKOCYTESUR NEGATIVE 07/24/2018 1705     RADIOLOGY: Dg Chest Port 1 View  Result Date: 07/24/2018 CLINICAL DATA:  85 year old male with fever. EXAM: PORTABLE CHEST 1 VIEW COMPARISON:  No prior chest imaging. FINDINGS: Portable AP upright view at 1545 hours. Low lung volumes with elevated right hemidiaphragm. Asymmetric streaky opacity about the right hilum. Elsewhere the lungs appear clear allowing for portable technique. Other mediastinal contours are within normal limits. Visualized tracheal air column is within normal limits. No acute osseous abnormality identified. Paucity of bowel gas in the visible upper abdomen. IMPRESSION: Low lung volumes with asymmetric right hilar and lung base opacity which could reflect atelectasis or infection. PA and lateral views of the chest would be helpful when possible. No other acute cardiopulmonary abnormality identified. Electronically Signed   By: Genevie Ann M.D.   On: 07/24/2018 16:13   Dg Foot 2 Views Right  Result Date: 07/24/2018 CLINICAL DATA:  Open wound to distal second toe. EXAM: RIGHT FOOT - 2 VIEW COMPARISON:  07/03/2014 FINDINGS: Two-view exam shows great toe amputation. Although assessment limited by positioning, there appears to be cortical loss in the DIP joint of the second toe, concerning for osteomyelitis. No other acute bony abnormality evident. IMPRESSION: 1. Apparent loss of cortical bone in the DIP joint of the second toe, involving the head of the middle phalanx and base of distal phalanx. Imaging features concerning for joint infection/osteomyelitis. 2. Status post great toe amputation. Electronically Signed   By: Misty Stanley M.D.   On: 07/24/2018 16:16    EKG: No orders found for this or any previous visit.  IMPRESSION AND PLAN: 85 year old male patient with a known history of diabetes mellitus type 2, hyperlipidemia, hypertension, osteomyelitis of the right great toe which has been amputated presented to the emergency room with pain  in the second toe of the right foot along with foul-smelling discharge and pus coming out between the right foot second and third toes.   -Osteomyelitis of the second and third toe right foot Podiatry consult for possible amputation Start patient on IV vancomycin and Zosyn antibiotics Follow-up cultures Wound care consult  -Atelectasis in the lungs Clinically stable  -Type 2 diabetes mellitus Diabetic diet with sliding scale coverage with insulin  -Hypertension Continue amlodipine and Coreg  -DVT prophylaxis subcu Lovenox daily  -Bedbugs Contact precautions Therapeutic treatment  -Elevated lactic acid levels Could be from dehydration versus osteomyelitis and infection IV fluids and antibiotics Follow-up lactic acid level  All the records are reviewed and case discussed with ED provider. Management plans discussed with the patient, family and they are in agreement.  CODE STATUS:Full code  TOTAL TIME TAKING CARE OF THIS PATIENT: 53 minutes.    Saundra Shelling M.D on 07/24/2018 at 5:46 PM  Between 7am to 6pm - Pager - [phone]  After 6pm go to [email or web address] - password [email or web address] [facility]  [facility]  Office  [phone]  CC: Primary care physician; McLean-Scocuzza, Nino Glow, MD

## 2018-07-24 NOTE — ED Triage Notes (Signed)
Patient came in via [facility] . Reports severe right leg and toe pain. He is a type 2 diabetes.  218 cbg 98 hr  150/70

## 2018-07-24 NOTE — Progress Notes (Signed)
Advanced care plan. Purpose of the Encounter: CODE STATUS Parties in Attendance:Patient Patient's Decision Capacity:OK Subjective/Patient's story: Lucas Jackson  is a 85 y.o. male with a known history of diabetes mellitus type 2, hyperlipidemia, hypertension, osteomyelitis of the right great toe which has been amputated presented to the emergency room with pain in the second toe of the right foot along with foul-smelling discharge and pus coming out between the right foot second and third toes.  Objective/Medical story Patient has osteomyelitis of the second and third toe.  Needs podiatry evaluation possible amputation of the toes.  Patient also has bedbugs which need to be treated.  Needs IV fluids and broad-spectrum antibiotics. Goals of care determination:  Advance care directives goals of care and treatment plan discussed Patient wants everything done which includes CPR, intubation ventilator if the need arises. CODE STATUS: Full code Time spent discussing advanced care planning: 16 minutes

## 2018-07-24 NOTE — Progress Notes (Signed)
Pharmacy Antibiotic Note  Lucas Jackson is a 85 y.o. male admitted on 07/24/2018 with osteomyelitis.  Pharmacy has been consulted for Vancomycin, Zosyn dosing.  Plan: Zosyn 3.375 gm IV X 1 given over 30 minutes on 4/19 @ 1627. Zosyn 3.375 gm IV Q8H EI ordered to start on 4/19 @ 2300.  Vancomycin 1 gm IV X 1 given in ED on 4/19 @ 1634. Vancomycin 1 gm IV X 1 ordered to be given following initial 1 gm dose to make total loading dose of 2 gm.  Vancomycin 1500 mg IV Q36H ordered to start on 4/21 @ 0500. This pt will reach Css by 4/25 @ 0500.  Will draw peak 1.5 hrs after 4th dose on 4/24 @ 0800. Will draw trough 30 minutes before 5th dose on 4/25 @ 1630.  CrCl = 32.4 ml/min Ke = 0.031 hr-1 T1/2 = 22.2 hrs Vd = 62.1 L  Calc AUC = 515.3 Vanc trough = 12.1 mcg/mL   Height: 5\' 11"  (180.3 cm) Weight: 190 lb (86.2 kg) IBW/kg (Calculated) : 75.3  Temp (24hrs), Avg:101.1 F (38.4 C), Min:101.1 F (38.4 C), Max:101.1 F (38.4 C)  Recent Labs  Lab 07/24/18 1612  WBC 10.8*  CREATININE 1.68*  LATICACIDVEN 2.4*    Estimated Creatinine Clearance: 32.4 mL/min (A) (by C-G formula based on SCr of 1.68 mg/dL (H)).    Allergies  Allergen Reactions  . 5-Alpha Reductase Inhibitors     Antimicrobials this admission:   >>    >>   Dose adjustments this admission:   Microbiology results:  BCx:   UCx:    Sputum:    MRSA PCR:   Thank you for allowing pharmacy to be a part of this patient's care.  Cylee Dattilo D 07/24/2018 5:52 PM

## 2018-07-25 ENCOUNTER — Inpatient Hospital Stay: Payer: Medicare Other

## 2018-07-25 LAB — URINE CULTURE

## 2018-07-25 LAB — CBC
HCT: 26.5 % — ABNORMAL LOW (ref 39.0–52.0)
Hemoglobin: 8.5 g/dL — ABNORMAL LOW (ref 13.0–17.0)
MCH: 24.3 pg — ABNORMAL LOW (ref 26.0–34.0)
MCHC: 32.1 g/dL (ref 30.0–36.0)
MCV: 75.7 fL — ABNORMAL LOW (ref 80.0–100.0)
Platelets: 332 10*3/uL (ref 150–400)
RBC: 3.5 MIL/uL — ABNORMAL LOW (ref 4.22–5.81)
RDW: 14.3 % (ref 11.5–15.5)
WBC: 10.4 10*3/uL (ref 4.0–10.5)
nRBC: 0 % (ref 0.0–0.2)

## 2018-07-25 LAB — BASIC METABOLIC PANEL
Anion gap: 10 (ref 5–15)
BUN: 37 mg/dL — ABNORMAL HIGH (ref 8–23)
CO2: 23 mmol/L (ref 22–32)
Calcium: 8.5 mg/dL — ABNORMAL LOW (ref 8.9–10.3)
Chloride: 102 mmol/L (ref 98–111)
Creatinine, Ser: 1.32 mg/dL — ABNORMAL HIGH (ref 0.61–1.24)
GFR calc Af Amer: 55 mL/min — ABNORMAL LOW (ref >60)
GFR calc non Af Amer: 48 mL/min — ABNORMAL LOW (ref >60)
Glucose, Bld: 128 mg/dL — ABNORMAL HIGH (ref 70–99)
Potassium: 3.6 mmol/L (ref 3.5–5.1)
Sodium: 135 mmol/L (ref 135–145)

## 2018-07-25 LAB — GLUCOSE, CAPILLARY
Glucose-Capillary: 121 mg/dL — ABNORMAL HIGH (ref 70–99)
Glucose-Capillary: 110 mg/dL — ABNORMAL HIGH (ref 70–99)
Glucose-Capillary: 105 mg/dL — ABNORMAL HIGH (ref 70–99)
Glucose-Capillary: 117 mg/dL — ABNORMAL HIGH (ref 70–99)

## 2018-07-25 NOTE — Consult Note (Signed)
[place] Nurse has reviewed record and this patient has a positive xray or MRI for osteomyelitis, this is considered outside of the scope of practice for the [place] nurse, for that reason [facility] Nurse will not consult. Contacted podiatristcon that is following patient currently for dressing change orders to be entered.   Angeleah Labrake [facility] MSN, [place], [place], [place], [place]

## 2018-07-25 NOTE — Progress Notes (Addendum)
[place] [facility] [place] NAME: Lucas Jackson    MR#:  [id number]  DATE OF BIRTH:  Oct 24, 1927  SUBJECTIVE:   Chief Complaint  Patient presents with   Toe Pain   Patient continues to complain of right leg pain.  REVIEW OF SYSTEMS:  Review of Systems  Constitutional: Negative for chills and fever.  HENT: Negative for sore throat.   Respiratory: Negative for cough, shortness of breath and wheezing.   Cardiovascular: Negative for chest pain and leg swelling.  Gastrointestinal: Negative for abdominal pain, diarrhea, nausea and vomiting.  Genitourinary: Negative for dysuria.  Musculoskeletal: Negative for back pain and myalgias.  Neurological: Negative for dizziness, sensory change, speech change, focal weakness and headaches.   DRUG ALLERGIES:   Allergies  Allergen Reactions   5-Alpha Reductase Inhibitors    VITALS:  Blood pressure 128/60, pulse 76, temperature 99.5 F (37.5 C), temperature source Oral, resp. rate 18, height 5\' 11"  (1.803 m), weight 82 kg, SpO2 97 %. PHYSICAL EXAMINATION:  Physical Exam GENERAL:  85 y.o.-year-old patient lying in the bed with no acute distress.  EYES: Pupils equal, round, reactive to light and accommodation. No scleral icterus. Extraocular muscles intact.  HEENT: Head atraumatic, normocephalic. Oropharynx and nasopharynx clear.  NECK:  Supple, no jugular venous distention. No thyroid enlargement, no tenderness.  LUNGS: Normal breath sounds bilaterally, no wheezing, rales,rhonchi or crepitation. No use of accessory muscles of respiration.  CARDIOVASCULAR: S1, S2 normal. No murmurs, rubs, or gallops.  ABDOMEN: Soft, nontender, nondistended. Bowel sounds present. No organomegaly or mass.  EXTREMITIES: Right great toe amputated 2nd toe right foot swollen purulent discharge noted  NEUROLOGIC: Cranial nerves II through XII are intact. Muscle strength 5/5 in all extremities. Sensation intact. Gait not  checked.  PSYCHIATRIC: The patient is alert and oriented x 3.  SKIN: purulent discharge between 2nd and 3rd toes right foot. Dressing applied back after inspection.   DATA REVIEWED:  LABORATORY PANEL:  Male CBC Recent Labs  Lab 07/25/18 0451  WBC 10.4  HGB 8.5*  HCT 26.5*  PLT 332   ------------------------------------------------------------------------------------------------------------------ Chemistries  Recent Labs  Lab 07/24/18 1612 07/25/18 0451  NA 135 135  K 4.0 3.6  CL 99 102  CO2 25 23  GLUCOSE 146* 128*  BUN 44* 37*  CREATININE 1.68* 1.32*  CALCIUM 9.0 8.5*  AST 25  --   ALT 19  --   ALKPHOS 85  --   BILITOT 1.0  --    RADIOLOGY:  US Venous Img Lower Unilateral Right  Result Date: 07/24/2018 CLINICAL DATA:  Right leg pain and swelling. EXAM: RIGHT LOWER EXTREMITY VENOUS DOPPLER ULTRASOUND TECHNIQUE: Gray-scale sonography with graded compression, as well as color Doppler and duplex ultrasound were performed to evaluate the lower extremity deep venous systems from the level of the common femoral vein and including the common femoral, femoral, profunda femoral, popliteal and calf veins including the posterior tibial, peroneal and gastrocnemius veins when visible. The superficial great saphenous vein was also interrogated. Spectral Doppler was utilized to evaluate flow at rest and with distal augmentation maneuvers in the common femoral, femoral and popliteal veins. COMPARISON:  None. FINDINGS: Contralateral Common Femoral Vein: Respiratory phasicity is normal and symmetric with the symptomatic side. No evidence of thrombus. Normal compressibility. Common Femoral Vein: No evidence of thrombus. Normal compressibility, respiratory phasicity and response to augmentation. Saphenofemoral Junction: No evidence of thrombus. Normal compressibility and flow on color Doppler imaging. Profunda Femoral Vein: No  evidence of thrombus. Normal compressibility and flow on color Doppler  imaging. Femoral Vein: No evidence of thrombus. Short segment of nonspecific wall thickening noted along the deep wall. Normal compressibility, respiratory phasicity and response to augmentation. Popliteal Vein: No evidence of thrombus. Normal compressibility, respiratory phasicity and response to augmentation. Calf Veins: No evidence of thrombus. Normal compressibility and flow on color Doppler imaging. Other Findings:  None. IMPRESSION: No evidence of deep venous thrombosis. Electronically Signed   By: Misty Stanley M.D.   On: 07/24/2018 18:05   Dg Chest Port 1 View  Result Date: 07/24/2018 CLINICAL DATA:  85 year old male with fever. EXAM: PORTABLE CHEST 1 VIEW COMPARISON:  No prior chest imaging. FINDINGS: Portable AP upright view at 1545 hours. Low lung volumes with elevated right hemidiaphragm. Asymmetric streaky opacity about the right hilum. Elsewhere the lungs appear clear allowing for portable technique. Other mediastinal contours are within normal limits. Visualized tracheal air column is within normal limits. No acute osseous abnormality identified. Paucity of bowel gas in the visible upper abdomen. IMPRESSION: Low lung volumes with asymmetric right hilar and lung base opacity which could reflect atelectasis or infection. PA and lateral views of the chest would be helpful when possible. No other acute cardiopulmonary abnormality identified. Electronically Signed   By: Genevie Ann M.D.   On: 07/24/2018 16:13   Dg Foot 2 Views Right  Result Date: 07/24/2018 CLINICAL DATA:  Open wound to distal second toe. EXAM: RIGHT FOOT - 2 VIEW COMPARISON:  07/03/2014 FINDINGS: Two-view exam shows great toe amputation. Although assessment limited by positioning, there appears to be cortical loss in the DIP joint of the second toe, concerning for osteomyelitis. No other acute bony abnormality evident. IMPRESSION: 1. Apparent loss of cortical bone in the DIP joint of the second toe, involving the head of the middle  phalanx and base of distal phalanx. Imaging features concerning for joint infection/osteomyelitis. 2. Status post great toe amputation. Electronically Signed   By: Misty Stanley M.D.   On: 07/24/2018 16:16   ASSESSMENT AND PLAN:   85 y.o. male presenting with worsening right leg pain and ulceration laterally on the second toe of the right foot with drainage swelling and pain.  Patient has had previous amputation of the right great toe with some contracture of the lesser adjacent digits worsening due to loss of the great toe.  1.  Osteomyelitis of the second toe with purulent drainage and an open wound laterally -X-ray of right foot shows possible osteomyelitis in the middle phalanx region -Ultrasound venous right lower extremity negative for DVT -Podiatry consult for possible amputation tomorrow -MRI right foot pending -Cultures pending -Continue IV Vanco and Zosyn  2. Acute kidney injury- Likely prerenal causes. -BUN/creatinine improved with IV fluid hydration.   -Holding Metformin and nephrotoxins -IVFs   3. Diabetes Mellitus Type 2  -Check HGB A1c  -Diabetic diet with sliding scale coverage with insulin -Patient does not check his sugars at home, will advise  Fingerstick checks at home.   4. Hypertension- stable - Continue amlodipine and Coreg  5. DVT prophylaxis subcu Lovenox daily  6. Atelectasis - Chest x-ray shows asymmetric right hilar and lung base opacity which could reflect atelectasis or infection -Patient clinically stable no evidence or s/s of infection on exam to support possible pneumonia -On abx as above   All the records are reviewed and case discussed with Care Management/Social Worker. Management plans discussed with the patient, family and they are in agreement.  CODE STATUS:  Full Code  TOTAL TIME TAKING CARE OF THIS PATIENT: 38 minutes.   More than 50% of the time was spent in counseling/coordination of care: YES  POSSIBLE D/C IN 1-2 DAYS, DEPENDING  ON CLINICAL CONDITION.   on 07/25/2018 at 1:11 PM  This patient was staffed with Dr. Stark Jock, Jude who personally evaluated patient, reviewed documentation and agreed with assessment and plan of care as above.  Rufina Falco, DNP, FNP-BC [facility] Nurse Practitioner   Between 7am to 6pm - Pager [phone]  After 6pm go to [email or web address] - Proofreader  [facility]  Hospitalists  Office  [phone]  CC: Primary care physician; McLean-Scocuzza, Nino Glow, MD  Note: This dictation was prepared with Dragon dictation along with smaller phrase technology. Any transcriptional errors that result from this process are unintentional.

## 2018-07-25 NOTE — Consult Note (Signed)
[facility] Podiatry                                                      Patient Demographics  Lucas Jackson, is a 85 y.o. male   MRN: [id number]   DOB - 1927/07/09  Admit Date - 07/24/2018    Outpatient Primary MD for the patient is McLean-Scocuzza, Nino Glow, MD  Consult requested in the Hospital by Otila Back, MD, On 07/25/2018    Reason for consult: Diabetic infection second toe right foot   With History of -  Past Medical History:  Diagnosis Date  . Diabetes mellitus without complication ([place])   . Hyperlipidemia   . Hypertension   . Osteomyelitis [facility])       Past Surgical History:  Procedure Laterality Date  . APPENDECTOMY     1985  . BRAIN SURGERY  1961   removed bone from inside skull that was pressing on optic nerve  . HERNIA REPAIR     umbilical 9357   . TOE AMPUTATION     right great toe 2016    in for   Chief Complaint  Patient presents with  . Toe Pain     HPI  Lucas Jackson  is a 85 y.o. male, he states that he does have a sore going on on his second toe of his right foot for 3 weeks.  Did not want to come in because of coronavirus.  He has had a right great toe amputation in the past.  Patient is diabetic and states he is not recently seen any vascular doctors.    Review of Systems    In addition to the HPI above,  No Fever-chills, No Headache, No changes with Vision or hearing, No problems swallowing food or Liquids, No Chest pain, Cough or Shortness of Breath, No Abdominal pain, No Nausea or Vommitting, Bowel movements are regular, No Blood in stool or Urine, No dysuria, Skin shows an ulcer with drainage to the right second toe, patient apparently did have bedbugs on the skin on admission. No new joints pains-aches,  No new weakness, tingling, numbness in any extremity, No recent weight gain or loss, No polyuria,  polydypsia or polyphagia, No significant Mental Stressors.  A full 10 point Review of Systems was done, except as stated above, all other Review of Systems were negative.   Social History Social History   Tobacco Use  . Smoking status: Former Smoker    Last attempt to quit: 1961    Years since quitting: 59.3  . Smokeless tobacco: Never Used  Substance Use Topics  . Alcohol use: No    Family History Family History  Problem Relation Age of Onset  . Colon cancer Brother   . Cancer Brother        colon   . Stroke Brother   . Heart disease Daughter        MI    Prior to Admission medications   Medication Sig Start Date End Date Taking? Authorizing Provider  amLODipine (NORVASC) 2.5 MG tablet Take 1 tablet (2.5 mg total) by mouth daily. 06/09/18  Yes McLean-Scocuzza, Nino Glow, MD  aspirin EC 81 MG tablet Take 1 tablet (81 mg total) by mouth daily. 10/28/17  Yes McLean-Scocuzza, Nino Glow, MD  carvedilol (COREG) 25 MG tablet Take 1 tablet (25  mg total) by mouth 2 (two) times daily. 02/10/18  Yes McLean-Scocuzza, Nino Glow, MD  chlorthalidone (HYGROTON) 25 MG tablet Take 1 tablet (25 mg total) by mouth daily. 02/10/18  Yes McLean-Scocuzza, Nino Glow, MD  metFORMIN (GLUCOPHAGE-XR) 500 MG 24 hr tablet Take 1 tablet (500 mg total) by mouth daily. With food 02/10/18  Yes McLean-Scocuzza, Nino Glow, MD  Omega-3 Fatty Acids (FISH OIL) 1000 MG CAPS Take 1 capsule (1,000 mg total) by mouth 2 (two) times daily. 02/10/18  Yes McLean-Scocuzza, Nino Glow, MD  telmisartan (MICARDIS) 80 MG tablet Take 1 tablet (80 mg total) by mouth daily. 02/10/18  Yes McLean-Scocuzza, Nino Glow, MD  acetaminophen (TYLENOL) 500 MG tablet Take 1,000 mg by mouth 2 (two) times daily.    [provider]  diclofenac sodium (VOLTAREN) 1 % GEL Apply 2 g topically 4 (four) times daily. B/l knees. Can use up to 4 grams 03/09/18   McLean-Scocuzza, Nino Glow, MD  traMADol (ULTRAM) 50 MG tablet Take 1 tablet (50 mg total) by mouth 2 (two)  times daily as needed. 10/28/17   McLean-Scocuzza, Nino Glow, MD  tuberculin (APLISOL) 5 UNIT/0.1ML injection Inject into the skin.    [provider]    Anti-infectives (From admission, onward)   Start     Dose/Rate Route Frequency Ordered Stop   07/26/18 0500  vancomycin (VANCOCIN) 1,500 mg in sodium chloride 0.9 % 500 mL IVPB     1,500 mg 250 mL/hr over 120 Minutes Intravenous Every 36 hours 07/24/18 1752     07/24/18 2300  piperacillin-tazobactam (ZOSYN) IVPB 3.375 g     3.375 g 12.5 mL/hr over 240 Minutes Intravenous Every 8 hours 07/24/18 1733     07/24/18 1745  vancomycin (VANCOCIN) IVPB 1000 mg/200 mL premix     1,000 mg 200 mL/hr over 60 Minutes Intravenous STAT 07/24/18 1738 07/24/18 2158   07/24/18 1600  vancomycin (VANCOCIN) IVPB 1000 mg/200 mL premix     1,000 mg 200 mL/hr over 60 Minutes Intravenous  Once 07/24/18 1545 07/24/18 1756   07/24/18 1600  piperacillin-tazobactam (ZOSYN) IVPB 3.375 g     3.375 g 100 mL/hr over 30 Minutes Intravenous  Once 07/24/18 1545 07/24/18 1701      Scheduled Meds: . amLODipine  2.5 mg Oral Daily  . carvedilol  25 mg Oral BID  . enoxaparin (LOVENOX) injection  40 mg Subcutaneous Q24H  . insulin aspart  0-15 Units Subcutaneous TID WC  . insulin aspart  0-5 Units Subcutaneous QHS  . insulin aspart  3 Units Subcutaneous TID WC  . irbesartan  75 mg Oral Daily  . metFORMIN  500 mg Oral Q breakfast  . omega-3 acid ethyl esters  1 g Oral Daily  . sodium chloride flush  3 mL Intravenous Q12H   Continuous Infusions: . sodium chloride    . sodium chloride 75 mL/hr at 07/24/18 2155  . piperacillin-tazobactam (ZOSYN)  IV 3.375 g (07/25/18 0644)  . [START ON 07/26/2018] vancomycin     PRN Meds:.sodium chloride, acetaminophen **OR** acetaminophen, HYDROcodone-acetaminophen, ondansetron **OR** ondansetron (ZOFRAN) IV, senna-docusate, sodium chloride flush  Allergies  Allergen Reactions  . 5-Alpha Reductase Inhibitors     Physical  Exam  Vitals  Blood pressure (!) 121/54, pulse 76, temperature 99.5 F (37.5 C), temperature source Oral, resp. rate 18, height 5\' 11"  (1.803 m), weight 82 kg, SpO2 97 %.  Lower Extremity exam:  Vascular: Difficult to palpate bilateral  Dermatological: Ulceration laterally on the second toe of the  right foot with drainage swelling and pain with palpation to the region.  X-ray showed likely osteomyelitis in the middle phalanx region.  An MRI has not been done yet.  Neurological: Peripheral neuropathy but patient does feel pain  Ortho: Previous amputation of the right great toe with some contracture of the lesser digits worsening because of that loss of the great toe.  Data Review  CBC Recent Labs  Lab 07/24/18 1612 07/25/18 0451  WBC 10.8* 10.4  HGB 9.7* 8.5*  HCT 29.4* 26.5*  PLT 340 332  MCV 75.4* 75.7*  MCH 24.9* 24.3*  MCHC 33.0 32.1  RDW 14.5 14.3  LYMPHSABS 0.8  --   MONOABS 0.6  --   EOSABS 0.0  --   BASOSABS 0.0  --    ------------------------------------------------------------------------------------------------------------------  Chemistries  Recent Labs  Lab 07/24/18 1612 07/25/18 0451  NA 135 135  K 4.0 3.6  CL 99 102  CO2 25 23  GLUCOSE 146* 128*  BUN 44* 37*  CREATININE 1.68* 1.32*  CALCIUM 9.0 8.5*  AST 25  --   ALT 19  --   ALKPHOS 85  --   BILITOT 1.0  --    ------------------------------------------------------------------------------------------------------------------ estimated creatinine clearance is 41.2 mL/min (A) (by C-G formula based on SCr of 1.32 mg/dL (H)). ------------------------------------------------------------------------------------------------------------------ No results for input(s): TSH, T4TOTAL, T3FREE, THYROIDAB in the last 72 hours.  Invalid input(s): FREET3 Urinalysis    Component Value Date/Time   COLORURINE YELLOW (A) 07/24/2018 1705   APPEARANCEUR CLEAR (A) 07/24/2018 1705   APPEARANCEUR Clear  08/05/2017 1601   LABSPEC 1.016 07/24/2018 1705   PHURINE 5.0 07/24/2018 1705   GLUCOSEU NEGATIVE 07/24/2018 1705   HGBUR SMALL (A) 07/24/2018 1705   BILIRUBINUR NEGATIVE 07/24/2018 1705   BILIRUBINUR Negative 08/05/2017 1601   KETONESUR NEGATIVE 07/24/2018 1705   PROTEINUR NEGATIVE 07/24/2018 1705   NITRITE NEGATIVE 07/24/2018 1705   LEUKOCYTESUR NEGATIVE 07/24/2018 1705     Imaging results:   US Venous Img Lower Unilateral Right  Result Date: 07/24/2018 CLINICAL DATA:  Right leg pain and swelling. EXAM: RIGHT LOWER EXTREMITY VENOUS DOPPLER ULTRASOUND TECHNIQUE: Gray-scale sonography with graded compression, as well as color Doppler and duplex ultrasound were performed to evaluate the lower extremity deep venous systems from the level of the common femoral vein and including the common femoral, femoral, profunda femoral, popliteal and calf veins including the posterior tibial, peroneal and gastrocnemius veins when visible. The superficial great saphenous vein was also interrogated. Spectral Doppler was utilized to evaluate flow at rest and with distal augmentation maneuvers in the common femoral, femoral and popliteal veins. COMPARISON:  None. FINDINGS: Contralateral Common Femoral Vein: Respiratory phasicity is normal and symmetric with the symptomatic side. No evidence of thrombus. Normal compressibility. Common Femoral Vein: No evidence of thrombus. Normal compressibility, respiratory phasicity and response to augmentation. Saphenofemoral Junction: No evidence of thrombus. Normal compressibility and flow on color Doppler imaging. Profunda Femoral Vein: No evidence of thrombus. Normal compressibility and flow on color Doppler imaging. Femoral Vein: No evidence of thrombus. Short segment of nonspecific wall thickening noted along the deep wall. Normal compressibility, respiratory phasicity and response to augmentation. Popliteal Vein: No evidence of thrombus. Normal compressibility, respiratory  phasicity and response to augmentation. Calf Veins: No evidence of thrombus. Normal compressibility and flow on color Doppler imaging. Other Findings:  None. IMPRESSION: No evidence of deep venous thrombosis. Electronically Signed   By: Misty Stanley M.D.   On: 07/24/2018 18:05   Dg Chest Port 1  View  Result Date: 07/24/2018 CLINICAL DATA:  85 year old male with fever. EXAM: PORTABLE CHEST 1 VIEW COMPARISON:  No prior chest imaging. FINDINGS: Portable AP upright view at 1545 hours. Low lung volumes with elevated right hemidiaphragm. Asymmetric streaky opacity about the right hilum. Elsewhere the lungs appear clear allowing for portable technique. Other mediastinal contours are within normal limits. Visualized tracheal air column is within normal limits. No acute osseous abnormality identified. Paucity of bowel gas in the visible upper abdomen. IMPRESSION: Low lung volumes with asymmetric right hilar and lung base opacity which could reflect atelectasis or infection. PA and lateral views of the chest would be helpful when possible. No other acute cardiopulmonary abnormality identified. Electronically Signed   By: Genevie Ann M.D.   On: 07/24/2018 16:13   Dg Foot 2 Views Right  Result Date: 07/24/2018 CLINICAL DATA:  Open wound to distal second toe. EXAM: RIGHT FOOT - 2 VIEW COMPARISON:  07/03/2014 FINDINGS: Two-view exam shows great toe amputation. Although assessment limited by positioning, there appears to be cortical loss in the DIP joint of the second toe, concerning for osteomyelitis. No other acute bony abnormality evident. IMPRESSION: 1. Apparent loss of cortical bone in the DIP joint of the second toe, involving the head of the middle phalanx and base of distal phalanx. Imaging features concerning for joint infection/osteomyelitis. 2. Status post great toe amputation. Electronically Signed   By: Misty Stanley M.D.   On: 07/24/2018 16:16    Assessment & Plan: Patient has osteomyelitis more than likely  to the second toe with purulent drainage and an open wound laterally on the.  He does experience pain with palpation at the MTP joint level as well.  I will go ahead and order an MRI just to rule out any proximal progression of the abscess.  If there is considerable proximal and lateral progression he would likely need a transmetatarsal amputation.  If it is all contained to that toe MTP joint level then I should be able to do a second toe amputation.  I will try to get this set up for tomorrow.  He is to stay on his IV antibiotics at present.  I will also get a vascular consult.  I will also get infectious disease consult when time is appropriate after surgery.  Active Problems:   Osteomyelitis [facility])   Family Communication: Plan discussed with patient  Albertine Patricia M.D on 07/25/2018 at 8:00 AM  Thank you for the consult, we will follow the patient with you in the Hospital.

## 2018-07-26 ENCOUNTER — Inpatient Hospital Stay: Payer: Medicare Other | Admitting: Anesthesiology

## 2018-07-26 ENCOUNTER — Other Ambulatory Visit: Payer: Self-pay

## 2018-07-26 ENCOUNTER — Encounter
Admission: EM | Disposition: A | Discharge status: Skilled Nursing Facility | Payer: Self-pay | Source: Home / Self Care | Attending: Internal Medicine

## 2018-07-26 DIAGNOSIS — E1151 Type 2 diabetes mellitus with diabetic peripheral angiopathy without gangrene: Secondary | ICD-10-CM

## 2018-07-26 DIAGNOSIS — I70261 Atherosclerosis of native arteries of extremities with gangrene, right leg: Secondary | ICD-10-CM

## 2018-07-26 DIAGNOSIS — L97919 Non-pressure chronic ulcer of unspecified part of right lower leg with unspecified severity: Secondary | ICD-10-CM

## 2018-07-26 DIAGNOSIS — M869 Osteomyelitis, unspecified: Secondary | ICD-10-CM

## 2018-07-26 DIAGNOSIS — I10 Essential (primary) hypertension: Secondary | ICD-10-CM

## 2018-07-26 DIAGNOSIS — J9811 Atelectasis: Secondary | ICD-10-CM

## 2018-07-26 HISTORY — PX: AMPUTATION TOE: SHX6595

## 2018-07-26 LAB — CBC WITH DIFFERENTIAL/PLATELET
Abs Immature Granulocytes: 0.05 10*3/uL (ref 0.00–0.07)
Basophils Absolute: 0 10*3/uL (ref 0.0–0.1)
Basophils Relative: 0 %
Eosinophils Absolute: 0.2 10*3/uL (ref 0.0–0.5)
Eosinophils Relative: 2 %
HCT: 25.1 % — ABNORMAL LOW (ref 39.0–52.0)
Hemoglobin: 8.1 g/dL — ABNORMAL LOW (ref 13.0–17.0)
Immature Granulocytes: 1 %
Lymphocytes Relative: 14 %
Lymphs Abs: 1.3 10*3/uL (ref 0.7–4.0)
MCH: 24.3 pg — ABNORMAL LOW (ref 26.0–34.0)
MCHC: 32.3 g/dL (ref 30.0–36.0)
MCV: 75.4 fL — ABNORMAL LOW (ref 80.0–100.0)
Monocytes Absolute: 0.6 10*3/uL (ref 0.1–1.0)
Monocytes Relative: 7 %
Neutro Abs: 7.1 10*3/uL (ref 1.7–7.7)
Neutrophils Relative %: 76 %
Platelets: 306 10*3/uL (ref 150–400)
RBC: 3.33 MIL/uL — ABNORMAL LOW (ref 4.22–5.81)
RDW: 14.4 % (ref 11.5–15.5)
WBC: 9.2 10*3/uL (ref 4.0–10.5)
nRBC: 0 % (ref 0.0–0.2)

## 2018-07-26 LAB — GLUCOSE, CAPILLARY
Glucose-Capillary: 101 mg/dL — ABNORMAL HIGH (ref 70–99)
Glucose-Capillary: 97 mg/dL (ref 70–99)
Glucose-Capillary: 100 mg/dL — ABNORMAL HIGH (ref 70–99)
Glucose-Capillary: 140 mg/dL — ABNORMAL HIGH (ref 70–99)
Glucose-Capillary: 123 mg/dL — ABNORMAL HIGH (ref 70–99)

## 2018-07-26 LAB — BASIC METABOLIC PANEL
Anion gap: 8 (ref 5–15)
BUN: 27 mg/dL — ABNORMAL HIGH (ref 8–23)
CO2: 25 mmol/L (ref 22–32)
Calcium: 8.4 mg/dL — ABNORMAL LOW (ref 8.9–10.3)
Chloride: 102 mmol/L (ref 98–111)
Creatinine, Ser: 1.25 mg/dL — ABNORMAL HIGH (ref 0.61–1.24)
GFR calc Af Amer: 59 mL/min — ABNORMAL LOW (ref >60)
GFR calc non Af Amer: 51 mL/min — ABNORMAL LOW (ref >60)
Glucose, Bld: 100 mg/dL — ABNORMAL HIGH (ref 70–99)
Potassium: 3.6 mmol/L (ref 3.5–5.1)
Sodium: 135 mmol/L (ref 135–145)

## 2018-07-26 LAB — MAGNESIUM: Magnesium: 2 mg/dL (ref 1.7–2.4)

## 2018-07-26 SURGERY — AMPUTATION, TOE
Anesthesia: General | Laterality: Right

## 2018-07-26 MED ORDER — BUPIVACAINE HCL 0.5 % IJ SOLN
INTRAMUSCULAR | Status: DC | PRN
  Administered 2018-07-26: 30 mL

## 2018-07-26 MED ORDER — CEFAZOLIN SODIUM-DEXTROSE 1-4 GM/50ML-% IV SOLN
1.0000 g | INTRAVENOUS | Status: AC
  Filled 2018-07-26: qty 50

## 2018-07-26 MED ORDER — LIDOCAINE HCL (CARDIAC) PF 100 MG/5ML IV SOSY
PREFILLED_SYRINGE | INTRAVENOUS | Status: DC | PRN
  Administered 2018-07-26: 100 mg via INTRAVENOUS

## 2018-07-26 MED ORDER — FENTANYL CITRATE (PF) 100 MCG/2ML IJ SOLN
25.0000 ug | INTRAMUSCULAR | Status: DC | PRN
  Administered 2018-07-26: 50 ug via INTRAVENOUS

## 2018-07-26 MED ORDER — PROPOFOL 500 MG/50ML IV EMUL
INTRAVENOUS | Status: AC
  Filled 2018-07-26: qty 50

## 2018-07-26 MED ORDER — PROPOFOL 10 MG/ML IV BOLUS
INTRAVENOUS | Status: DC | PRN
  Administered 2018-07-26: 100 mg via INTRAVENOUS

## 2018-07-26 MED ORDER — SODIUM CHLORIDE 0.9 % IV SOLN
INTRAVENOUS | Status: DC
  Administered 2018-07-27 – 2018-07-29 (×3): via INTRAVENOUS

## 2018-07-26 MED ORDER — FENTANYL CITRATE (PF) 100 MCG/2ML IJ SOLN
INTRAMUSCULAR | Status: AC
  Filled 2018-07-26: qty 2

## 2018-07-26 MED ORDER — PHENYLEPHRINE HCL (PRESSORS) 10 MG/ML IV SOLN
INTRAVENOUS | Status: DC | PRN
  Administered 2018-07-26 (×5): 100 ug via INTRAVENOUS

## 2018-07-26 MED ORDER — ONDANSETRON HCL 4 MG/2ML IJ SOLN
4.0000 mg | Freq: Once | INTRAMUSCULAR | Status: AC | PRN
  Administered 2018-07-26: 4 mg via INTRAVENOUS

## 2018-07-26 SURGICAL SUPPLY — 39 items
"PENCIL ELECTRO HAND CTR " (MISCELLANEOUS) ×1 IMPLANT
BANDAGE ELASTIC 4 LF NS (GAUZE/BANDAGES/DRESSINGS) ×3 IMPLANT
BLADE MED AGGRESSIVE (BLADE) ×3 IMPLANT
BLADE SURG 15 STRL LF DISP TIS (BLADE) ×4 IMPLANT
BLADE SURG 15 STRL SS (BLADE) ×8
BNDG CONFORM 3 STRL LF (GAUZE/BANDAGES/DRESSINGS) ×3 IMPLANT
BNDG ESMARK 4X12 TAN STRL LF (GAUZE/BANDAGES/DRESSINGS) ×3 IMPLANT
BNDG GAUZE 4.5X4.1 6PLY STRL (MISCELLANEOUS) ×3 IMPLANT
CANISTER SUCT 1200ML W/VALVE (MISCELLANEOUS) ×3 IMPLANT
COVER WAND RF STERILE (DRAPES) ×3 IMPLANT
CUFF TOURN SGL QUICK 12 (TOURNIQUET CUFF) IMPLANT
CUFF TOURN SGL QUICK 18X4 (TOURNIQUET CUFF) ×2 IMPLANT
DRAPE FLUOR MINI C-ARM 54X84 (DRAPES) ×1 IMPLANT
DURAPREP 26ML APPLICATOR (WOUND CARE) ×3 IMPLANT
ELECT REM PT RETURN 9FT ADLT (ELECTROSURGICAL) ×3
ELECTRODE REM PT RTRN 9FT ADLT (ELECTROSURGICAL) ×1 IMPLANT
GAUZE SPONGE 4X4 12PLY STRL (GAUZE/BANDAGES/DRESSINGS) ×3 IMPLANT
GAUZE XEROFORM 1X8 LF (GAUZE/BANDAGES/DRESSINGS) ×3 IMPLANT
GLOVE BIO SURGEON STRL SZ8 (GLOVE) ×3 IMPLANT
GLOVE INDICATOR 8.0 STRL GRN (GLOVE) ×3 IMPLANT
GOWN STRL REUS W/ TWL LRG LVL3 (GOWN DISPOSABLE) ×2 IMPLANT
GOWN STRL REUS W/TWL LRG LVL3 (GOWN DISPOSABLE) ×4
HANDPIECE VERSAJET DEBRIDEMENT (MISCELLANEOUS) ×2 IMPLANT
KIT TURNOVER KIT A (KITS) ×3 IMPLANT
LABEL OR SOLS (LABEL) ×3 IMPLANT
NDL HYPO 25X1 1.5 SAFETY (NEEDLE) ×3 IMPLANT
NDL SAFETY ECLIPSE 18X1.5 (NEEDLE) ×1 IMPLANT
NEEDLE HYPO 18GX1.5 SHARP (NEEDLE) ×2
NEEDLE HYPO 25X1 1.5 SAFETY (NEEDLE) ×9 IMPLANT
NS IRRIG 500ML POUR BTL (IV SOLUTION) ×3 IMPLANT
PACK EXTREMITY ARMC (MISCELLANEOUS) ×3 IMPLANT
PENCIL ELECTRO HAND CTR (MISCELLANEOUS) IMPLANT
RASP SM TEAR CROSS CUT (RASP) ×1 IMPLANT
STOCKINETTE STRL 6IN 960660 (GAUZE/BANDAGES/DRESSINGS) ×3 IMPLANT
SUT ETH BLK MONO 3 0 FS 1 12/B (SUTURE) ×3 IMPLANT
SUT ETHILON 5 0 PS 2 18 (SUTURE) ×3 IMPLANT
SUT VIC AB 4-0 FS2 27 (SUTURE) ×3 IMPLANT
SWAB CULTURE AMIES ANAERIB BLU (MISCELLANEOUS) ×3 IMPLANT
SYR 10ML LL (SYRINGE) ×3 IMPLANT

## 2018-07-26 NOTE — Anesthesia Procedure Notes (Signed)
Procedure Name: LMA Insertion Date/Time: 07/26/2018 9:47 AM Performed by: Nelda Marseille, CRNA Pre-anesthesia Checklist: Patient identified, Patient being monitored, Timeout performed, Emergency Drugs available and Suction available Patient Re-evaluated:Patient Re-evaluated prior to induction Oxygen Delivery Method: Circle system utilized Preoxygenation: Pre-oxygenation with 100% oxygen Induction Type: IV induction Ventilation: Mask ventilation without difficulty LMA: LMA inserted LMA Size: 4.5 Tube type: Oral Number of attempts: 1 Placement Confirmation: positive ETCO2 and breath sounds checked- equal and bilateral Tube secured with: Tape Dental Injury: Teeth and Oropharynx as per pre-operative assessment

## 2018-07-26 NOTE — Op Note (Signed)
Operative note   Surgeon: Dr. Albertine Patricia, DPM.    Assistant: None    Preop diagnosis: Gangrene and osteomyelitis second toe right foot    Postop diagnosis: Same    Procedure:   1.  Second ray amputation right foot           EBL: 15 mL    Anesthesia:general delivered by the anesthesia team with local anesthesia of 10 cc 0.5% Marcaine plain injected preoperatively by me proximal to the operative site    Hemostasis: Ankle tourniquet 2 and 30 mils mercury pressure for 15 minutes    Specimen: 1.  Deep tissue culture around the metatarsal phalangeal area. 2.  The gangrenous toe was sent to pathology.  3.  The resected second metatarsal head was sent to pathology with a proximal margin marked for evaluation regarding clean margins    Complications: None    Operative indications: Gangrene infection second toe right foot    Procedure:  Patient was brought into the OR and placed on the operating table in thesupine position. After anesthesia was obtained theright lower extremity was prepped and draped in usual sterile fashion.  Operative Report: This time attention directed to the second toe of the right foot which was noted to be gangrenous with foul smelling and drainage from the area.  2 semielliptical incisions were made around the toe starting dorsally running medial at the base of the digit to plantarly and a similar incision was made laterally.  This allowed me to take the incision down to bone where the MTP joint was identified and soft tissue was resected and the toe was removed.  Significant purulent drainage was noted at the MTP joint level.  And also the base of the proximal phalanx level.  The head of the metatarsal was inspected and due to significant infection around the region I also elected this time to go and resect the second metatarsal and sent to pathology to check for clear margins.  The bone appeared fairly stable clinically with solid bone at the point of  resection. This time a versa jet was used to remove all infected tissues and all devitalized tissues in the region.  Once this was accomplished the tourniquet was released and reasonable capillary bleeding was achieved in the region.  No evidence of further infection was noted.  The long flexor tendon was explored plantarly and no purulence was noted coming from that region the tendon was resected to a normal level of tissue.  After copious irrigation with normal saline the deep fascial tissues were closed with 3-0 Vicryl in a continuous stitch.  Skin was then closed with 3-0 nylon in simple interrupted fashion.  A sterile dressing was placed across the area at this time consisting of Xeroform gauze 4 x 4's Kerlix and conformer and an Ace wrap.  Patient is remain on bedrest at least till tomorrow.    Patient tolerated the procedure and anesthesia well.  Was transported from the OR to the PACU with all vital signs stable and vascular status intact. To be discharged per routine protocol.  Will follow up in approximately 1 week in the outpatient clinic.

## 2018-07-26 NOTE — Transfer of Care (Signed)
Immediate Anesthesia Transfer of Care Note  Patient: Lucas Jackson  Procedure(s) Performed: AMPUTATION TOE 2ND TOE RIGHT FOOT (Right )  Patient Location: PACU  Anesthesia Type:General  Level of Consciousness: awake and sedated  Airway & Oxygen Therapy: Patient Spontanous Breathing and Patient connected to face mask oxygen  Post-op Assessment: Report given to RN and Post -op Vital signs reviewed and stable  Post vital signs: Reviewed and stable  Last Vitals:  Vitals Value Taken Time  BP    Temp    Pulse    Resp    SpO2      Last Pain:  Vitals:   07/26/18 0859  TempSrc: Temporal  PainSc: 10-Worst pain ever      Patients Stated Pain Goal: 0 (51/83/35 8251)  Complications: No apparent anesthesia complications

## 2018-07-26 NOTE — Consult Note (Signed)
@LOGO @   MRN : [id number]  Lucas Jackson is a 85 y.o. (11/07/1927) male who presents with chief complaint of  Chief Complaint  Patient presents with  . Toe Pain  .  History of Present Illness:   I am asked to evaluate the patient by Dr. Elvina Mattes for diminished pedal pulses in association with gangrene of the right foot.  The patient is an 85 year old gentleman who was admitted to the hospital secondary to gangrene and infection of the right foot.  The patient notes the ulcer has been present for multiple weeks and has not been improving.  It is very painful and has had some drainage.  No specific history of trauma noted by the patient.  The patient denies fever or chills.  the patient does have diabetes which has been difficult to control.  The patient denies rest pain or dangling of an extremity off the side of the bed during the night for relief. No prior interventions or surgeries.  No history of back problems or DJD of the lumbar sacral spine.   The patient denies amaurosis fugax or recent TIA symptoms. There are no recent neurological changes noted. The patient denies history of DVT, PE or superficial thrombophlebitis. The patient denies recent episodes of angina or shortness of breath.   Current Meds  Medication Sig  . amLODipine (NORVASC) 2.5 MG tablet Take 1 tablet (2.5 mg total) by mouth daily.  Marland Kitchen aspirin EC 81 MG tablet Take 1 tablet (81 mg total) by mouth daily.  . carvedilol (COREG) 25 MG tablet Take 1 tablet (25 mg total) by mouth 2 (two) times daily.  . chlorthalidone (HYGROTON) 25 MG tablet Take 1 tablet (25 mg total) by mouth daily.  . metFORMIN (GLUCOPHAGE-XR) 500 MG 24 hr tablet Take 1 tablet (500 mg total) by mouth daily. With food  . Omega-3 Fatty Acids (FISH OIL) 1000 MG CAPS Take 1 capsule (1,000 mg total) by mouth 2 (two) times daily.  Marland Kitchen telmisartan (MICARDIS) 80 MG tablet Take 1 tablet (80 mg total) by mouth daily.    Past Medical History:  Diagnosis  Date  . Diabetes mellitus without complication ([place])   . Hyperlipidemia   . Hypertension   . Osteomyelitis [facility])     Past Surgical History:  Procedure Laterality Date  . AMPUTATION TOE Right 07/26/2018   Procedure: AMPUTATION TOE 2ND TOE RIGHT FOOT;  Surgeon: Albertine Patricia, DPM;  Location: [facility] ORS;  Service: Podiatry;  Laterality: Right;  . APPENDECTOMY     1985  . BRAIN SURGERY  1961   removed bone from inside skull that was pressing on optic nerve  . HERNIA REPAIR     umbilical 8588   . TOE AMPUTATION     right great toe 2016    Social History Social History   Tobacco Use  . Smoking status: Former Smoker    Last attempt to quit: 1961    Years since quitting: 59.3  . Smokeless tobacco: Never Used  Substance Use Topics  . Alcohol use: No  . Drug use: Never    Family History Family History  Problem Relation Age of Onset  . Colon cancer Brother   . Cancer Brother        colon   . Stroke Brother   . Heart disease Daughter        MI    Allergies  Allergen Reactions  . 5-Alpha Reductase Inhibitors      REVIEW OF SYSTEMS (Negative unless checked)  Constitutional: [] Weight loss  [] Fever  [] Chills Cardiac: [] Chest pain   [] Chest pressure   [] Palpitations   [] Shortness of breath when laying flat   [] Shortness of breath with exertion. Vascular:  [] Pain in legs with walking   [x] Pain in legs at rest  [] History of DVT   [] Phlebitis   [] Swelling in legs   [] Varicose veins   [x] Non-healing ulcers Pulmonary:   [] Uses home oxygen   [] Productive cough   [] Hemoptysis   [] Wheeze  [] COPD   [] Asthma Neurologic:  [] Dizziness   [] Seizures   [] History of stroke   [] History of TIA  [] Aphasia   [] Vissual changes   [] Weakness or numbness in arm   [] Weakness or numbness in leg Musculoskeletal:   [] Joint swelling   [] Joint pain   [] Low back pain Hematologic:  [] Easy bruising  [] Easy bleeding   [] Hypercoagulable state   [] Anemic Gastrointestinal:  [] Diarrhea   [] Vomiting   [] Gastroesophageal reflux/heartburn   [] Difficulty swallowing. Genitourinary:  [] Chronic kidney disease   [] Difficult urination  [] Frequent urination   [] Blood in urine Skin:  [] Rashes   [] Ulcers  Psychological:  [] History of anxiety   []  History of major depression.  Physical Examination  Vitals:   07/26/18 1301 07/26/18 1409 07/26/18 1516 07/26/18 1624  BP: 140/60 124/60 (!) 143/53 (!) 153/71  Pulse: 67  74 72  Resp: 18  20   Temp: 97.9 F (36.6 C)  98.5 F (36.9 C) 98.6 F (37 C)  TempSrc: Oral  Oral Oral  SpO2: 92%  100% 96%  Weight:      Height:       Body mass index is 25.21 kg/m. Gen: WD/WN, NAD Head: [place]/AT, No temporalis wasting.  Ear/Nose/Throat: Hearing grossly intact, nares w/o erythema or drainage Eyes: PER, EOMI, sclera nonicteric.  Neck: Supple, no large masses.   Pulmonary:  Good air movement, no audible wheezing bilaterally, no use of accessory muscles.  Cardiac: RRR, no JVD Vascular: Right foot bandaged Vessel Right Left  Radial Palpable Palpable  Popliteal  trace palpable  trace palpable  PT  postoperative bandage  not palpable  DP  postoperative bandage  not palpable  Gastrointestinal: Non-distended. No guarding/no peritoneal signs.  Musculoskeletal: M/S 5/5 throughout.  No deformity or atrophy.  Neurologic: CN 2-12 intact. Symmetrical.  Speech is fluent. Motor exam as listed above. Psychiatric: Judgment intact, Mood & affect appropriate for pt's clinical situation. Dermatologic: No rashes right foot ulcer dressed and postoperative bandage.   Lymph : No lichenification or skin changes of chronic lymphedema.  CBC Lab Results  Component Value Date   WBC 9.2 07/26/2018   HGB 8.1 (L) 07/26/2018   HCT 25.1 (L) 07/26/2018   MCV 75.4 (L) 07/26/2018   PLT 306 07/26/2018    BMET    Component Value Date/Time   NA 135 07/26/2018 0517   NA 138 07/07/2014 0418   K 3.6 07/26/2018 0517   K 3.4 (L) 07/07/2014 0418   CL 102 07/26/2018 0517   CL 105  07/07/2014 0418   CO2 25 07/26/2018 0517   CO2 27 07/07/2014 0418   GLUCOSE 100 (H) 07/26/2018 0517   GLUCOSE 98 07/07/2014 0418   BUN 27 (H) 07/26/2018 0517   BUN 19 07/07/2014 0418   CREATININE 1.25 (H) 07/26/2018 0517   CREATININE 1.28 (H) 07/08/2014 0422   CALCIUM 8.4 (L) 07/26/2018 0517   CALCIUM 7.8 (L) 07/07/2014 0418   GFRNONAA 51 (L) 07/26/2018 0517   GFRNONAA 51 (L) 07/08/2014 0422   GFRAA  59 (L) 07/26/2018 0517   GFRAA 59 (L) 07/08/2014 0422   Estimated Creatinine Clearance: 43.5 mL/min (A) (by C-G formula based on SCr of 1.25 mg/dL (H)).  COAG No results found for: INR, PROTIME  Radiology Mr Foot Right Wo Contrast  Result Date: 07/25/2018 CLINICAL DATA:  Right second toe ulcer. EXAM: MRI OF THE RIGHT FOREFOOT WITHOUT CONTRAST TECHNIQUE: Multiplanar, multisequence MR imaging of the right forefoot was performed. No intravenous contrast was administered. COMPARISON:  Right foot x-rays dated July 24, 2018. FINDINGS: Bones/Joint/Cartilage Prior great toe amputation. Abnormal marrow edema with decreased T1 marrow signal involving the second distal and middle phalanges surrounding the DIP joint. No fracture or dislocation. Normal alignment. Small second MTP joint effusion. Ligaments Collateral ligaments are intact. Muscles and Tendons Flexor, peroneal and extensor compartment tendons are intact. There is fluid surrounding the distal second flexor tendons. Increased T2 signal within the intrinsic muscles of the forefoot, nonspecific, but likely related to diabetic muscle changes. Soft tissue Dorsal soft tissue ulceration over the second DIP joint. There is an ill-defined 2.4 x 1.0 x 2.5 cm fluid collection along the medial aspect of the second proximal phalanx base. No soft tissue mass. IMPRESSION: 1. Dorsal soft tissue ulceration over the second DIP joint with underlying septic arthritis and adjacent osteomyelitis of the second distal and middle phalanges. 2. Fluid surrounding the  distal second flexor tendons, suspicious for infectious tenosynovitis. 3. Ill-defined 2.5 cm fluid collection along the medial aspect of the second proximal phalanx base, concerning for developing abscess. Electronically Signed   By: Titus Dubin M.D.   On: 07/25/2018 23:44   US Venous Img Lower Unilateral Right  Result Date: 07/24/2018 CLINICAL DATA:  Right leg pain and swelling. EXAM: RIGHT LOWER EXTREMITY VENOUS DOPPLER ULTRASOUND TECHNIQUE: Gray-scale sonography with graded compression, as well as color Doppler and duplex ultrasound were performed to evaluate the lower extremity deep venous systems from the level of the common femoral vein and including the common femoral, femoral, profunda femoral, popliteal and calf veins including the posterior tibial, peroneal and gastrocnemius veins when visible. The superficial great saphenous vein was also interrogated. Spectral Doppler was utilized to evaluate flow at rest and with distal augmentation maneuvers in the common femoral, femoral and popliteal veins. COMPARISON:  None. FINDINGS: Contralateral Common Femoral Vein: Respiratory phasicity is normal and symmetric with the symptomatic side. No evidence of thrombus. Normal compressibility. Common Femoral Vein: No evidence of thrombus. Normal compressibility, respiratory phasicity and response to augmentation. Saphenofemoral Junction: No evidence of thrombus. Normal compressibility and flow on color Doppler imaging. Profunda Femoral Vein: No evidence of thrombus. Normal compressibility and flow on color Doppler imaging. Femoral Vein: No evidence of thrombus. Short segment of nonspecific wall thickening noted along the deep wall. Normal compressibility, respiratory phasicity and response to augmentation. Popliteal Vein: No evidence of thrombus. Normal compressibility, respiratory phasicity and response to augmentation. Calf Veins: No evidence of thrombus. Normal compressibility and flow on color Doppler  imaging. Other Findings:  None. IMPRESSION: No evidence of deep venous thrombosis. Electronically Signed   By: Misty Stanley M.D.   On: 07/24/2018 18:05   Dg Chest Port 1 View  Result Date: 07/24/2018 CLINICAL DATA:  85 year old male with fever. EXAM: PORTABLE CHEST 1 VIEW COMPARISON:  No prior chest imaging. FINDINGS: Portable AP upright view at 1545 hours. Low lung volumes with elevated right hemidiaphragm. Asymmetric streaky opacity about the right hilum. Elsewhere the lungs appear clear allowing for portable technique. Other mediastinal contours are within normal  limits. Visualized tracheal air column is within normal limits. No acute osseous abnormality identified. Paucity of bowel gas in the visible upper abdomen. IMPRESSION: Low lung volumes with asymmetric right hilar and lung base opacity which could reflect atelectasis or infection. PA and lateral views of the chest would be helpful when possible. No other acute cardiopulmonary abnormality identified. Electronically Signed   By: Genevie Ann M.D.   On: 07/24/2018 16:13   Dg Foot 2 Views Right  Result Date: 07/24/2018 CLINICAL DATA:  Open wound to distal second toe. EXAM: RIGHT FOOT - 2 VIEW COMPARISON:  07/03/2014 FINDINGS: Two-view exam shows great toe amputation. Although assessment limited by positioning, there appears to be cortical loss in the DIP joint of the second toe, concerning for osteomyelitis. No other acute bony abnormality evident. IMPRESSION: 1. Apparent loss of cortical bone in the DIP joint of the second toe, involving the head of the middle phalanx and base of distal phalanx. Imaging features concerning for joint infection/osteomyelitis. 2. Status post great toe amputation. Electronically Signed   By: Misty Stanley M.D.   On: 07/24/2018 16:16     Assessment/Plan 1.  Atherosclerotic occlusive disease bilateral lower extremities with gangrene:  Recommend:  The patient has evidence of severe atherosclerotic changes of both lower  extremities associated with ulceration and tissue loss of the right foot.  This represents a limb threatening ischemia and places the patient at the risk for limb loss.  Patient should undergo angiography of the right lower extremities with the hope for intervention for limb salvage.  The risks and benefits as well as the alternative therapies was discussed in detail with the patient.  All questions were answered.  Patient agrees to proceed with right leg angiography.    Angiography is scheduled for tomorrow July 27, 2018  The patient will follow up with me in the office after the procedure.   2.  Osteomyelitis of the second and third toe right foot: Podiatry performed surgery earlier today to remove the infected bone Continue patient on IV vancomycin and Zosyn antibiotics Follow-up cultures  3.  Atelectasis in the lungs: Continue inspiration spirometer  4.  Type 2 diabetes mellitus: Diabetic diet with sliding scale coverage with insulin  5.  Hypertension: Continue amlodipine and Coreg   Hortencia Pilar, MD  07/26/2018 5:10 PM

## 2018-07-26 NOTE — Anesthesia Post-op Follow-up Note (Signed)
Anesthesia QCDR form completed.        

## 2018-07-26 NOTE — H&P (Signed)
H and P has been reviewed and no changes are noted. MRI indicates infection contained to 2nd toe only.

## 2018-07-26 NOTE — Progress Notes (Signed)
[facility] NAME: Lucas Jackson    MR#:  [id number]  DATE OF BIRTH:  11-Nov-1927  SUBJECTIVE:   Chief Complaint  Patient presents with   Toe Pain   Patient back from OR s/p amputation of right second toe. He denies any pain at this time. He is asking if he can have something to eat.   REVIEW OF SYSTEMS:  Review of Systems  Constitutional: Negative for chills and fever.  HENT: Negative for sore throat.   Respiratory: Negative for cough, shortness of breath and wheezing.   Cardiovascular: Negative for chest pain and leg swelling.  Gastrointestinal: Negative for abdominal pain, diarrhea, nausea and vomiting.  Genitourinary: Negative for dysuria.  Musculoskeletal: Negative for back pain and myalgias.  Neurological: Negative for dizziness, sensory change, speech change, focal weakness and headaches.   DRUG ALLERGIES:   Allergies  Allergen Reactions   5-Alpha Reductase Inhibitors    VITALS:  Blood pressure 122/81, pulse 66, temperature 98 F (36.7 C), resp. rate 16, height 5\' 11"  (1.803 m), weight 82 kg, SpO2 97 %. PHYSICAL EXAMINATION:   GENERAL:  85 y.o.-year-old patient lying in the bed with no acute distress.  EYES: Pupils equal, round, reactive to light and accommodation. No scleral icterus. Extraocular muscles intact.  HEENT: Head atraumatic, normocephalic. Oropharynx and nasopharynx clear.  NECK:  Supple, no jugular venous distention. No thyroid enlargement, no tenderness.  LUNGS: Normal breath sounds bilaterally, no wheezing, rales,rhonchi or crepitation. No use of accessory muscles of respiration.  CARDIOVASCULAR: S1, S2 normal. No murmurs, rubs, or gallops.  ABDOMEN: Soft, nontender, nondistended. Bowel sounds present. No organomegaly or mass.  EXTREMITIES: Right great toe amputated 2nd toe right foot swollen purulent discharge noted  NEUROLOGIC: Cranial nerves II through XII are intact. Muscle strength 5/5 in  all extremities. Sensation intact. Gait not checked.  PSYCHIATRIC: The patient is alert and oriented x 3.  SKIN: OR dressing to right foot.   DATA REVIEWED:  LABORATORY PANEL:  Male CBC Recent Labs  Lab 07/26/18 0517  WBC 9.2  HGB 8.1*  HCT 25.1*  PLT 306   ------------------------------------------------------------------------------------------------------------------ Chemistries  Recent Labs  Lab 07/24/18 1612  07/26/18 0517  NA 135   < > 135  K 4.0   < > 3.6  CL 99   < > 102  CO2 25   < > 25  GLUCOSE 146*   < > 100*  BUN 44*   < > 27*  CREATININE 1.68*   < > 1.25*  CALCIUM 9.0   < > 8.4*  MG  --   --  2.0  AST 25  --   --   ALT 19  --   --   ALKPHOS 85  --   --   BILITOT 1.0  --   --    < > = values in this interval not displayed.   RADIOLOGY:  Mr Foot Right Wo Contrast  Result Date: 07/25/2018 CLINICAL DATA:  Right second toe ulcer. EXAM: MRI OF THE RIGHT FOREFOOT WITHOUT CONTRAST TECHNIQUE: Multiplanar, multisequence MR imaging of the right forefoot was performed. No intravenous contrast was administered. COMPARISON:  Right foot x-rays dated July 24, 2018. FINDINGS: Bones/Joint/Cartilage Prior great toe amputation. Abnormal marrow edema with decreased T1 marrow signal involving the second distal and middle phalanges surrounding the DIP joint. No fracture or dislocation. Normal alignment. Small second MTP joint effusion. Ligaments Collateral ligaments are intact. Muscles and Tendons Flexor,  peroneal and extensor compartment tendons are intact. There is fluid surrounding the distal second flexor tendons. Increased T2 signal within the intrinsic muscles of the forefoot, nonspecific, but likely related to diabetic muscle changes. Soft tissue Dorsal soft tissue ulceration over the second DIP joint. There is an ill-defined 2.4 x 1.0 x 2.5 cm fluid collection along the medial aspect of the second proximal phalanx base. No soft tissue mass. IMPRESSION: 1. Dorsal soft tissue  ulceration over the second DIP joint with underlying septic arthritis and adjacent osteomyelitis of the second distal and middle phalanges. 2. Fluid surrounding the distal second flexor tendons, suspicious for infectious tenosynovitis. 3. Ill-defined 2.5 cm fluid collection along the medial aspect of the second proximal phalanx base, concerning for developing abscess. Electronically Signed   By: Titus Dubin M.D.   On: 07/25/2018 23:44   US Venous Img Lower Unilateral Right  Result Date: 07/24/2018 CLINICAL DATA:  Right leg pain and swelling. EXAM: RIGHT LOWER EXTREMITY VENOUS DOPPLER ULTRASOUND TECHNIQUE: Gray-scale sonography with graded compression, as well as color Doppler and duplex ultrasound were performed to evaluate the lower extremity deep venous systems from the level of the common femoral vein and including the common femoral, femoral, profunda femoral, popliteal and calf veins including the posterior tibial, peroneal and gastrocnemius veins when visible. The superficial great saphenous vein was also interrogated. Spectral Doppler was utilized to evaluate flow at rest and with distal augmentation maneuvers in the common femoral, femoral and popliteal veins. COMPARISON:  None. FINDINGS: Contralateral Common Femoral Vein: Respiratory phasicity is normal and symmetric with the symptomatic side. No evidence of thrombus. Normal compressibility. Common Femoral Vein: No evidence of thrombus. Normal compressibility, respiratory phasicity and response to augmentation. Saphenofemoral Junction: No evidence of thrombus. Normal compressibility and flow on color Doppler imaging. Profunda Femoral Vein: No evidence of thrombus. Normal compressibility and flow on color Doppler imaging. Femoral Vein: No evidence of thrombus. Short segment of nonspecific wall thickening noted along the deep wall. Normal compressibility, respiratory phasicity and response to augmentation. Popliteal Vein: No evidence of thrombus.  Normal compressibility, respiratory phasicity and response to augmentation. Calf Veins: No evidence of thrombus. Normal compressibility and flow on color Doppler imaging. Other Findings:  None. IMPRESSION: No evidence of deep venous thrombosis. Electronically Signed   By: Misty Stanley M.D.   On: 07/24/2018 18:05   Dg Chest Port 1 View  Result Date: 07/24/2018 CLINICAL DATA:  85 year old male with fever. EXAM: PORTABLE CHEST 1 VIEW COMPARISON:  No prior chest imaging. FINDINGS: Portable AP upright view at 1545 hours. Low lung volumes with elevated right hemidiaphragm. Asymmetric streaky opacity about the right hilum. Elsewhere the lungs appear clear allowing for portable technique. Other mediastinal contours are within normal limits. Visualized tracheal air column is within normal limits. No acute osseous abnormality identified. Paucity of bowel gas in the visible upper abdomen. IMPRESSION: Low lung volumes with asymmetric right hilar and lung base opacity which could reflect atelectasis or infection. PA and lateral views of the chest would be helpful when possible. No other acute cardiopulmonary abnormality identified. Electronically Signed   By: Genevie Ann M.D.   On: 07/24/2018 16:13   Dg Foot 2 Views Right  Result Date: 07/24/2018 CLINICAL DATA:  Open wound to distal second toe. EXAM: RIGHT FOOT - 2 VIEW COMPARISON:  07/03/2014 FINDINGS: Two-view exam shows great toe amputation. Although assessment limited by positioning, there appears to be cortical loss in the DIP joint of the second toe, concerning for osteomyelitis. No other  acute bony abnormality evident. IMPRESSION: 1. Apparent loss of cortical bone in the DIP joint of the second toe, involving the head of the middle phalanx and base of distal phalanx. Imaging features concerning for joint infection/osteomyelitis. 2. Status post great toe amputation. Electronically Signed   By: Misty Stanley M.D.   On: 07/24/2018 16:16   ASSESSMENT AND PLAN:   85  y.o. male presenting with worsening right leg pain and ulceration laterally on the second toe of the right foot with drainage swelling and pain.  Patient has had previous amputation of the right great toe with some contracture of the lesser adjacent digits worsening due to loss of the great toe.  1.  Osteomyelitis of the second toe status post amputation POD # 0 -X-ray of right foot showed possible osteomyelitis in the middle phalanx region -MRI right foot confirms infection to 2nd toe -Cultures shows abundant staphylococcus aureus -Continue IV Vanco and Zosyn -Podiatry consult appreciated. Will need follow up with them post op in approximately 1 week in the outpatient clinic  2. Acute kidney injury- Likely prerenal causes -BUN/creatinine improved with IV fluid hydration.   -Holding Metformin and nephrotoxins -Continue IVFs   3. Diabetes Mellitus Type 2  -HGB A1c pending -Diabetic diet with sliding scale coverage with insulin -Patient does not check his sugars at home, will advise  Fingerstick checks at home.   4. Hypertension- stable - Continue amlodipine and Coreg  5. DVT prophylaxis subcu Lovenox daily  6. Atelectasis - Chest x-ray shows asymmetric right hilar and lung base opacity which could reflect atelectasis or infection -Patient clinically stable no evidence or s/s of infection on exam to support possible pneumonia -On abx as above   All the records are reviewed and case discussed with Care Management/Social Worker. Management plans discussed with the patient, family and they are in agreement.  CODE STATUS: Full Code  TOTAL TIME TAKING CARE OF THIS PATIENT: 38 minutes.   More than 50% of the time was spent in counseling/coordination of care: YES  POSSIBLE D/C IN 1-2 DAYS, DEPENDING ON CLINICAL CONDITION.   on 07/26/2018 at 11:15 AM  This patient was staffed with Dr. Stark Jock, Jude who personally evaluated patient, reviewed documentation and agreed with assessment and  plan of care as above.  Rufina Falco, DNP, FNP-BC [facility] Nurse Practitioner   Between 7am to 6pm - Pager [phone]  After 6pm go to [email or web address] - Proofreader  [facility]  Office  [phone]  CC: Primary care physician; McLean-Scocuzza, Nino Glow, MD  Note: This dictation was prepared with Dragon dictation along with smaller phrase technology. Any transcriptional errors that result from this process are unintentional.

## 2018-07-26 NOTE — Anesthesia Preprocedure Evaluation (Addendum)
Anesthesia Evaluation  Patient identified by MRN, date of birth, ID band Patient awake    Reviewed: Allergy & Precautions, NPO status , Patient's Chart, lab work & pertinent test results  History of Anesthesia Complications Negative for: history of anesthetic complications  Airway Mallampati: II  TM Distance: >3 FB Neck ROM: Full    Dental  (+) Edentulous Upper, Edentulous Lower   Pulmonary neg sleep apnea, neg COPD, former smoker,    breath sounds clear to auscultation- rhonchi (-) wheezing      Cardiovascular hypertension, Pt. on medications (-) CAD, (-) Past MI, (-) Cardiac Stents and (-) CABG  Rhythm:Regular Rate:Normal - Systolic murmurs and - Diastolic murmurs    Neuro/Psych negative neurological ROS  negative psych ROS   GI/Hepatic negative GI ROS, Neg liver ROS,   Endo/Other  diabetes, Oral Hypoglycemic Agents  Renal/GU negative Renal ROS     Musculoskeletal negative musculoskeletal ROS (+)   Abdominal (+) - obese,   Peds  Hematology negative hematology ROS (+)   Anesthesia Other Findings Past Medical History: No date: Diabetes mellitus without complication (HCC) No date: Hyperlipidemia No date: Hypertension No date: Osteomyelitis (HCC)   Reproductive/Obstetrics                             Anesthesia Physical Anesthesia Plan  ASA: III  Anesthesia Plan: General   Post-op Pain Management:    Induction: Intravenous  PONV Risk Score and Plan: 1 and Ondansetron  Airway Management Planned: LMA  Additional Equipment:   Intra-op Plan:   Post-operative Plan:   Informed Consent: I have reviewed the patients History and Physical, chart, labs and discussed the procedure including the risks, benefits and alternatives for the proposed anesthesia with the patient or authorized representative who has indicated his/her understanding and acceptance.     Dental advisory  given  Plan Discussed with: CRNA and Anesthesiologist  Anesthesia Plan Comments:        Anesthesia Quick Evaluation

## 2018-07-26 NOTE — Anesthesia Postprocedure Evaluation (Signed)
Anesthesia Post Note  Patient: Lucas Jackson  Procedure(s) Performed: AMPUTATION TOE 2ND TOE RIGHT FOOT (Right )  Patient location during evaluation: PACU Anesthesia Type: General Level of consciousness: awake and alert and oriented Pain management: pain level controlled Vital Signs Assessment: post-procedure vital signs reviewed and stable Respiratory status: spontaneous breathing, nonlabored ventilation and respiratory function stable Cardiovascular status: blood pressure returned to baseline and stable Postop Assessment: no signs of nausea or vomiting Anesthetic complications: no     Last Vitals:  Vitals:   07/26/18 1045 07/26/18 1057  BP: (!) 123/57 122/81  Pulse: 66 66  Resp: 17 16  Temp: 36.7 C 36.7 C  SpO2: 97% 97%    Last Pain:  Vitals:   07/26/18 1057  TempSrc:   PainSc: 0-No pain                 Ether Goebel

## 2018-07-27 ENCOUNTER — Encounter
Admission: EM | Disposition: A | Discharge status: Skilled Nursing Facility | Payer: Self-pay | Source: Home / Self Care | Attending: Internal Medicine

## 2018-07-27 DIAGNOSIS — I70261 Atherosclerosis of native arteries of extremities with gangrene, right leg: Secondary | ICD-10-CM

## 2018-07-27 DIAGNOSIS — L97919 Non-pressure chronic ulcer of unspecified part of right lower leg with unspecified severity: Secondary | ICD-10-CM

## 2018-07-27 HISTORY — PX: LOWER EXTREMITY ANGIOGRAPHY: CATH118251

## 2018-07-27 LAB — HEMOGLOBIN A1C
Hgb A1c MFr Bld: 6.3 % — ABNORMAL HIGH (ref 4.8–5.6)
Mean Plasma Glucose: 134 mg/dL

## 2018-07-27 LAB — GLUCOSE, CAPILLARY
Glucose-Capillary: 97 mg/dL (ref 70–99)
Glucose-Capillary: 91 mg/dL (ref 70–99)
Glucose-Capillary: 88 mg/dL (ref 70–99)
Glucose-Capillary: 113 mg/dL — ABNORMAL HIGH (ref 70–99)

## 2018-07-27 LAB — SURGICAL PATHOLOGY

## 2018-07-27 SURGERY — LOWER EXTREMITY ANGIOGRAPHY
Anesthesia: Moderate Sedation | Laterality: Right

## 2018-07-27 MED ORDER — NITROGLYCERIN 5 MG/ML IV SOLN
INTRAVENOUS | Status: AC
  Filled 2018-07-27: qty 10

## 2018-07-27 MED ORDER — FENTANYL CITRATE (PF) 100 MCG/2ML IJ SOLN
INTRAMUSCULAR | Status: AC
  Filled 2018-07-27: qty 2

## 2018-07-27 MED ORDER — SODIUM CHLORIDE 0.9 % IV SOLN
INTRAVENOUS | Status: AC
  Administered 2018-07-27: 17:00:00 via INTRAVENOUS

## 2018-07-27 MED ORDER — MIDAZOLAM HCL 2 MG/2ML IJ SOLN
INTRAMUSCULAR | Status: DC | PRN
  Administered 2018-07-27: 1 mg via INTRAVENOUS

## 2018-07-27 MED ORDER — CLOPIDOGREL BISULFATE 75 MG PO TABS
75.0000 mg | ORAL_TABLET | Freq: Every day | ORAL | Status: DC
  Administered 2018-07-28 – 2018-07-29 (×2): 75 mg via ORAL
  Filled 2018-07-27 (×2): qty 1

## 2018-07-27 MED ORDER — MIDAZOLAM HCL 2 MG/2ML IJ SOLN
INTRAMUSCULAR | Status: AC
  Filled 2018-07-27: qty 2

## 2018-07-27 MED ORDER — SODIUM CHLORIDE FLUSH 0.9 % IV SOLN
INTRAVENOUS | Status: AC
  Filled 2018-07-27: qty 10

## 2018-07-27 MED ORDER — CLOPIDOGREL BISULFATE 75 MG PO TABS
300.0000 mg | ORAL_TABLET | Freq: Once | ORAL | Status: AC
  Administered 2018-07-27: 22:00:00 300 mg via ORAL
  Filled 2018-07-27: qty 4

## 2018-07-27 MED ORDER — HEPARIN SODIUM (PORCINE) 1000 UNIT/ML IJ SOLN
INTRAMUSCULAR | Status: AC
  Filled 2018-07-27: qty 1

## 2018-07-27 MED ORDER — LIDOCAINE HCL (PF) 1 % IJ SOLN
INTRAMUSCULAR | Status: AC
  Filled 2018-07-27: qty 30

## 2018-07-27 MED ORDER — SODIUM CHLORIDE 0.9% FLUSH: 3.0000 mL | Freq: Two times a day (BID) | INTRAVENOUS | Status: DC

## 2018-07-27 MED ORDER — SODIUM CHLORIDE 0.9% FLUSH: 3.0000 mL | INTRAVENOUS | Status: DC | PRN

## 2018-07-27 MED ORDER — SODIUM CHLORIDE (PF) 0.9 % IJ SOLN
INTRAMUSCULAR | Status: AC
  Filled 2018-07-27: qty 50

## 2018-07-27 MED ORDER — HEPARIN SODIUM (PORCINE) 1000 UNIT/ML IJ SOLN
INTRAMUSCULAR | Status: DC | PRN
  Administered 2018-07-27: 5000 [IU] via INTRAVENOUS

## 2018-07-27 MED ORDER — NITROGLYCERIN 1 MG/10 ML FOR IR/CATH LAB
INTRA_ARTERIAL | Status: DC | PRN
  Administered 2018-07-27: 500 ug via INTRA_ARTERIAL

## 2018-07-27 MED ORDER — FENTANYL CITRATE (PF) 100 MCG/2ML IJ SOLN
INTRAMUSCULAR | Status: DC | PRN
  Administered 2018-07-27: 25 ug via INTRAVENOUS

## 2018-07-27 MED ORDER — SODIUM CHLORIDE 0.9 % IV SOLN: 250.0000 mL | INTRAVENOUS | Status: DC | PRN

## 2018-07-27 MED ORDER — IOHEXOL 300 MG/ML  SOLN
INTRAMUSCULAR | Status: DC | PRN
  Administered 2018-07-27: 16:00:00 115 mL via INTRAVENOUS

## 2018-07-27 SURGICAL SUPPLY — 17 items
BALLN ULTRSCOR 014 2.5X150X150 (BALLOONS) ×3
BALLOON ULTRSC 014 2.5X150X150 (BALLOONS) ×1 IMPLANT
CATH PIG 70CM (CATHETERS) ×3 IMPLANT
CATH SEEKER 014X150 (CATHETERS) ×3 IMPLANT
CATH VERT 5FR 125CM (CATHETERS) ×3 IMPLANT
DEVICE PRESTO INFLATION (MISCELLANEOUS) ×3 IMPLANT
DEVICE STARCLOSE SE CLOSURE (Vascular Products) ×3 IMPLANT
GLIDEWIRE ADV .014X300CM (WIRE) ×3 IMPLANT
GUIDEWIRE STR TIP .014X300X8 (WIRE) ×3 IMPLANT
NEEDLE ENTRY 21GA 7CM ECHOTIP (NEEDLE) ×3 IMPLANT
PACK ANGIOGRAPHY (CUSTOM PROCEDURE TRAY) ×3 IMPLANT
SET INTRO CAPELLA COAXIAL (SET/KITS/TRAYS/PACK) ×3 IMPLANT
SHEATH BRITE TIP 5FRX11 (SHEATH) ×3 IMPLANT
SHEATH RAABE 6FR (SHEATH) ×3 IMPLANT
TUBING CONTRAST HIGH PRESS 72 (TUBING) ×3 IMPLANT
WIRE AQUATRACK .035X260CM (WIRE) ×3 IMPLANT
WIRE J 3MM .035X145CM (WIRE) ×3 IMPLANT

## 2018-07-27 NOTE — Progress Notes (Signed)
Pt off floor for angiogram

## 2018-07-27 NOTE — Progress Notes (Signed)
ID Will do consult in the morning as the wound will have to be seen with the foot surgeon.

## 2018-07-27 NOTE — Op Note (Signed)
[facility] Percutaneous Study/Intervention Procedural Note   Date of Surgery: 07/27/2018  Surgeon: Hortencia Pilar  Pre-operative Diagnosis: Atherosclerotic occlusive disease bilateral lower extremities with gangrene and ulceration of the right lower extremity  Post-operative diagnosis: Same  Procedure(s) Performed: 1. Introduction catheter into right lower extremity 3rd order catheter placement  2. Contrast injection right lower extremity for distal runoff with additional 3rd order  3. Percutaneous transluminal angioplasty right posterior tibial artery to 2.5 mm              4. Star close closure left common femoral arteriotomy  Anesthesia: Conscious sedation was administered under my direct supervision by the interventional radiology RN. IV Versed plus fentanyl were utilized. Continuous ECG, pulse oximetry and blood pressure was monitored throughout the entire procedure.  Conscious sedation was for a total of 1 hour 17 minutes.  Sheath: 6 French Raby left common femoral retrograde  Contrast: 115 cc  Fluoroscopy Time: 12.0 minutes  Indications: Renae Fickle presents with increasing pain associated with gangrenous changes of the right lower extremity.  He is already undergone open debridement of his right foot with drainage of his abscess and resection of infected bone.  This suggests the patient is having limb threatening ischemia. The risks and benefits are reviewed all questions answered patient agrees to proceed.  Procedure:Dillin Jerilynn Mages Enderson is a 85 y.o. y.o. male who was identified and appropriate procedural time out was performed. The patient was then placed supine on the table and prepped and draped in the usual sterile fashion.   Ultrasound was placed in the sterile sleeve and the left groin was evaluated the left common femoral artery was echolucent and pulsatile indicating patency. Image  was recorded for the permanent record and under real-time visualization a microneedle was inserted into the common femoral artery followed by the microwire and then the micro-sheath. A J-wire was then advanced through the micro-sheath and a 5 [place] sheath was then inserted over a J-wire. J-wire was then advanced and a 5 French pigtail catheter was positioned at the level of T12.  AP projection of the aorta was then obtained. Pigtail catheter was repositioned to above the bifurcation and a LAO view of the pelvis was obtained. Subsequently a pigtail catheter with the stiff angle Glidewire was used to cross the aortic bifurcation the catheter wire were advanced down into the right distal external iliac artery. Oblique view of the femoral bifurcation was then obtained and subsequently the wire was reintroduced and the pigtail catheter negotiated into the SFA representing third order catheter placement. Distal runoff was then performed.  Diagnostic interpretation: The abdominal aorta is opacified with a bolus injection of contrast.  There is no hemodynamically significant stenosis or stricture noted.  Bilateral common external and internal iliac arteries are all widely patent with minimal atherosclerotic changes.  There is a 60 to 70% right renal artery stenosis.  Nephrogram remains normal.  There is no significant renal artery stenosis on the left nephrogram is normal.  The right common femoral profunda femoris superficial femoral and popliteal arteries demonstrate mild atherosclerotic changes but there are no hemodynamically significant stenoses noted.  Significant disease begins at the level of the trifurcation with occlusion of the anterior tibial 1 to 2 cm distal to its origin.  It remains occluded throughout its entire course.  The tibioperoneal trunk is widely patent as is the peroneal which demonstrates a large collateral to the dorsalis pedis.  The peroneal collateralizes poorly to the lateral plantar  vessels.  The posterior tibial is patent in its proximal two thirds at which time there is a short segment occlusion and then approximately 6 to 8 cm of greater than 90% stenosis.  There is reconstitution of the posterior tibial just below the medial malleolus distal to which it is widely patent filling the lateral and medial plantar vessels as well as the pedal arch.  Based on these images and findings I elected to move forward with intervention.   5000 units of heparin was then given and allowed to circulate and a 6 [place] Raby sheath was advanced up and over the bifurcation and positioned in the femoral artery  Straight catheter and stiff angle Glidewire were then negotiated down into the distal popliteal. Catheter was then advanced. Hand injection contrast demonstrated the tibial anatomy in detail and served as a roadmap for negotiating the wire and catheter into the posterior tibial.  Catheter is negotiated down to the subtotal occlusion where a magnified hand-injection of contrast is utilized to demonstrate the lesion in detail.  A 0.014 advantage is then advanced the Kumpe catheter was exchanged for an 0.014 seeker catheter and a combination of the advantage wire and the seeker catheter is used to cross the lesion.  Hand-injection of contrast through the seeker catheter demonstrates reentry which is intraluminal at the level of the calcaneus.  The advantage wire was then reintroduced.  A 2.5 mm x 15 cm ultra score balloon is then advanced across the lesion.  The inflation is to 14 atm for 1 full minute.  Follow-up imaging now demonstrates wide patency with less than 5% residual stenosis throughout the entire area that has been treated with the balloon.  A second image more distally demonstrates there is now narrowing as the posterior tibial crosses the calcaneus this appears to be spasm as it was not present in the initial image.  500 mcg of nitroglycerin is given intra-arterially and after waiting 1  minute this images repeated and there is total resolution of this area with rapid filling of contrast into the pedal arch.  After review of these images the sheath is pulled into the left external iliac oblique of the common femoral is obtained and a Star close device deployed. There no immediate Complications.  Findings:  The abdominal aorta is opacified with a bolus injection of contrast.  There is no hemodynamically significant stenosis or stricture noted.  Bilateral common external and internal iliac arteries are all widely patent with minimal atherosclerotic changes.  There is a 60 to 70% right renal artery stenosis.  Nephrogram remains normal.  There is no significant renal artery stenosis on the left nephrogram is normal.  The right common femoral profunda femoris superficial femoral and popliteal arteries demonstrate mild atherosclerotic changes but there are no hemodynamically significant stenoses noted.  Significant disease begins at the level of the trifurcation with occlusion of the anterior tibial 1 to 2 cm distal to its origin.  It remains occluded throughout its entire course.  The tibioperoneal trunk is widely patent as is the peroneal which demonstrates a large collateral to the dorsalis pedis.  The peroneal collateralizes poorly to the lateral plantar vessels.  The posterior tibial is patent in its proximal two thirds at which time there is a short segment occlusion and then approximately 6 to 8 cm of greater than 90% stenosis.  There is reconstitution of the posterior tibial just below the medial malleolus distal to which it is widely patent filling the lateral and medial plantar  vessels as well as the pedal arch.  Following angioplasty the right posterior tibial now is now widely patent with less than 5% residual stenosis and demonstrates in-line flow.  It is much improved and looks quite nice.   Summary: Successful recanalization right lower extremity for limb  salvage   Disposition: Patient was taken to the recovery room in stable condition having tolerated the procedure well.  Belenda Cruise Tallie Dodds 07/27/2018,3:57 PM

## 2018-07-27 NOTE — Care Management Important Message (Signed)
Important Message  Patient Details  Name: Lucas Jackson MRN: [id number] Date of Birth: 10/07/1927   Medicare Important Message Given:  Yes    Kissa Campoy Jen Mow, RN 07/27/2018, 10:42 AM

## 2018-07-27 NOTE — Progress Notes (Signed)
[place] [facility] [place] NAME: Lucas Jackson    MR#:  [id number]  DATE OF BIRTH:  September 26, 1927  SUBJECTIVE:   Chief Complaint  Patient presents with   Toe Pain   Patient doing well this morning. Still complaining of mild-moderate pain in right foot. Per nursing staff, had a slight fever last evening without associated symptoms.  REVIEW OF SYSTEMS:  Review of Systems  Constitutional: Negative for chills and fever.  HENT: Negative for sore throat.   Respiratory: Negative for cough, shortness of breath and wheezing.   Cardiovascular: Negative for chest pain and leg swelling.  Gastrointestinal: Negative for abdominal pain, diarrhea, nausea and vomiting.  Genitourinary: Negative for dysuria.  Musculoskeletal: Negative for back pain and myalgias.  Neurological: Negative for dizziness, sensory change, speech change, focal weakness and headaches.   DRUG ALLERGIES:   Allergies  Allergen Reactions   5-Alpha Reductase Inhibitors    VITALS:  Blood pressure (!) 117/55, pulse 67, temperature 99.2 F (37.3 C), temperature source Oral, resp. rate 14, height 5\' 11"  (1.803 m), weight 82 kg, SpO2 93 %. PHYSICAL EXAMINATION:   GENERAL:  85 y.o.-year-old patient lying in the bed with no acute distress.  EYES: Pupils equal, round, reactive to light and accommodation. No scleral icterus. Extraocular muscles intact.  HEENT: Head atraumatic, normocephalic. Oropharynx and nasopharynx clear.  NECK:  Supple, no jugular venous distention. No thyroid enlargement, no tenderness.  LUNGS: Normal breath sounds bilaterally, no wheezing, rales,rhonchi or crepitation. No use of accessory muscles of respiration.  CARDIOVASCULAR: S1, S2 normal. No murmurs, rubs, or gallops.  ABDOMEN: Soft, nontender, nondistended. Bowel sounds present. No organomegaly or mass.  EXTREMITIES: Right great toe amputated 2nd toe right foot swollen purulent discharge noted  NEUROLOGIC:  Cranial nerves II through XII are intact. Muscle strength 5/5 in all extremities. Sensation intact. Gait not checked.  PSYCHIATRIC: The patient is alert and oriented x 3.  SKIN: OR dressing to right foot.   DATA REVIEWED:  LABORATORY PANEL:  Male CBC Recent Labs  Lab 07/26/18 0517  WBC 9.2  HGB 8.1*  HCT 25.1*  PLT 306   ------------------------------------------------------------------------------------------------------------------ Chemistries  Recent Labs  Lab 07/24/18 1612  07/26/18 0517  NA 135   < > 135  K 4.0   < > 3.6  CL 99   < > 102  CO2 25   < > 25  GLUCOSE 146*   < > 100*  BUN 44*   < > 27*  CREATININE 1.68*   < > 1.25*  CALCIUM 9.0   < > 8.4*  MG  --   --  2.0  AST 25  --   --   ALT 19  --   --   ALKPHOS 85  --   --   BILITOT 1.0  --   --    < > = values in this interval not displayed.   RADIOLOGY:  Mr Foot Right Wo Contrast  Result Date: 07/25/2018 CLINICAL DATA:  Right second toe ulcer. EXAM: MRI OF THE RIGHT FOREFOOT WITHOUT CONTRAST TECHNIQUE: Multiplanar, multisequence MR imaging of the right forefoot was performed. No intravenous contrast was administered. COMPARISON:  Right foot x-rays dated July 24, 2018. FINDINGS: Bones/Joint/Cartilage Prior great toe amputation. Abnormal marrow edema with decreased T1 marrow signal involving the second distal and middle phalanges surrounding the DIP joint. No fracture or dislocation. Normal alignment. Small second MTP joint effusion. Ligaments Collateral ligaments are intact. Muscles and Tendons  Flexor, peroneal and extensor compartment tendons are intact. There is fluid surrounding the distal second flexor tendons. Increased T2 signal within the intrinsic muscles of the forefoot, nonspecific, but likely related to diabetic muscle changes. Soft tissue Dorsal soft tissue ulceration over the second DIP joint. There is an ill-defined 2.4 x 1.0 x 2.5 cm fluid collection along the medial aspect of the second proximal  phalanx base. No soft tissue mass. IMPRESSION: 1. Dorsal soft tissue ulceration over the second DIP joint with underlying septic arthritis and adjacent osteomyelitis of the second distal and middle phalanges. 2. Fluid surrounding the distal second flexor tendons, suspicious for infectious tenosynovitis. 3. Ill-defined 2.5 cm fluid collection along the medial aspect of the second proximal phalanx base, concerning for developing abscess. Electronically Signed   By: Titus Dubin M.D.   On: 07/25/2018 23:44   ASSESSMENT AND PLAN:   85 y.o. male presenting with worsening right leg pain and ulceration laterally on the second toe of the right foot with drainage swelling and pain.  Patient has had previous amputation of the right great toe with some contracture of the lesser adjacent digits worsening due to loss of the great toe.  1.  Osteomyelitis of the second toe status post amputation POD # 1 -X-ray of right foot showed possible osteomyelitis in the middle phalanx region -MRI right foot confirms infection to 2nd toe -Cultures shows abundant staphylococcus aureus -Continue IV Vanco and Zosyn -Podiatry consult appreciated. Will need follow up with them post op in approximately 1 week in the outpatient clinic  2. Atherosclerotic occlusive disease bilateral lower extremities with gangrene -Vascular following -Angiography is scheduled for today  3. Acute kidney injury- Likely prerenal causes -BUN/creatinine improved with IV fluid hydration.   -Holding Metformin and nephrotoxins -Continue IVFs   4. Diabetes Mellitus Type 2  -HGB A1c 6.3 -Diabetic diet with sliding scale coverage with insulin -Patient does not check his sugars at home, will advise  Fingerstick checks at home.   5. Hypertension- stable - Continue amlodipine and Coreg  6. DVT prophylaxis subcu Lovenox daily  7. Atelectasis - Chest x-ray shows asymmetric right hilar and lung base opacity which could reflect atelectasis or  infection -Patient clinically stable no evidence or s/s of infection on exam to support possible pneumonia -On abx as above    All the records are reviewed and case discussed with Care Management/Social Worker. Management plans discussed with the patient, family and they are in agreement.  CODE STATUS: Full Code  TOTAL TIME TAKING CARE OF THIS PATIENT: 38 minutes.   More than 50% of the time was spent in counseling/coordination of care: YES  POSSIBLE D/C IN 1-2 DAYS, DEPENDING ON CLINICAL CONDITION.   on 07/27/2018 at 11:03 AM  This patient was staffed with Dr. Stark Jock, Jude who personally evaluated patient, reviewed documentation and agreed with assessment and plan of care as above.  Rufina Falco, DNP, FNP-BC [facility] Nurse Practitioner   Between 7am to 6pm - Pager [phone]  After 6pm go to [email or web address] - Proofreader  [facility]  Office  [phone]  CC: Primary care physician; McLean-Scocuzza, Nino Glow, MD  Note: This dictation was prepared with Dragon dictation along with smaller phrase technology. Any transcriptional errors that result from this process are unintentional.

## 2018-07-28 ENCOUNTER — Encounter: Payer: Self-pay | Admitting: Vascular Surgery

## 2018-07-28 DIAGNOSIS — Z89421 Acquired absence of other right toe(s): Secondary | ICD-10-CM

## 2018-07-28 DIAGNOSIS — H548 Legal blindness, as defined in USA: Secondary | ICD-10-CM

## 2018-07-28 DIAGNOSIS — M869 Osteomyelitis, unspecified: Secondary | ICD-10-CM

## 2018-07-28 DIAGNOSIS — Z89411 Acquired absence of right great toe: Secondary | ICD-10-CM

## 2018-07-28 DIAGNOSIS — E1151 Type 2 diabetes mellitus with diabetic peripheral angiopathy without gangrene: Secondary | ICD-10-CM

## 2018-07-28 DIAGNOSIS — I70261 Atherosclerosis of native arteries of extremities with gangrene, right leg: Secondary | ICD-10-CM

## 2018-07-28 DIAGNOSIS — Z87891 Personal history of nicotine dependence: Secondary | ICD-10-CM

## 2018-07-28 DIAGNOSIS — B9561 Methicillin susceptible Staphylococcus aureus infection as the cause of diseases classified elsewhere: Secondary | ICD-10-CM

## 2018-07-28 DIAGNOSIS — Z9862 Peripheral vascular angioplasty status: Secondary | ICD-10-CM

## 2018-07-28 DIAGNOSIS — I10 Essential (primary) hypertension: Secondary | ICD-10-CM

## 2018-07-28 DIAGNOSIS — Z79899 Other long term (current) drug therapy: Secondary | ICD-10-CM

## 2018-07-28 DIAGNOSIS — E1169 Type 2 diabetes mellitus with other specified complication: Secondary | ICD-10-CM

## 2018-07-28 LAB — BASIC METABOLIC PANEL
Anion gap: 9 (ref 5–15)
BUN: 19 mg/dL (ref 8–23)
CO2: 26 mmol/L (ref 22–32)
Calcium: 7.9 mg/dL — ABNORMAL LOW (ref 8.9–10.3)
Chloride: 103 mmol/L (ref 98–111)
Creatinine, Ser: 1.11 mg/dL (ref 0.61–1.24)
GFR calc Af Amer: >60 mL/min (ref >60)
GFR calc non Af Amer: 59 mL/min — ABNORMAL LOW (ref >60)
Glucose, Bld: 110 mg/dL — ABNORMAL HIGH (ref 70–99)
Potassium: 3.7 mmol/L (ref 3.5–5.1)
Sodium: 138 mmol/L (ref 135–145)

## 2018-07-28 LAB — CBC WITH DIFFERENTIAL/PLATELET
Abs Immature Granulocytes: 0.04 10*3/uL (ref 0.00–0.07)
Basophils Absolute: 0 10*3/uL (ref 0.0–0.1)
Basophils Relative: 0 %
Eosinophils Absolute: 0.1 10*3/uL (ref 0.0–0.5)
Eosinophils Relative: 2 %
HCT: 23.7 % — ABNORMAL LOW (ref 39.0–52.0)
Hemoglobin: 7.6 g/dL — ABNORMAL LOW (ref 13.0–17.0)
Immature Granulocytes: 1 %
Lymphocytes Relative: 21 %
Lymphs Abs: 1.4 10*3/uL (ref 0.7–4.0)
MCH: 24.4 pg — ABNORMAL LOW (ref 26.0–34.0)
MCHC: 32.1 g/dL (ref 30.0–36.0)
MCV: 76 fL — ABNORMAL LOW (ref 80.0–100.0)
Monocytes Absolute: 0.4 10*3/uL (ref 0.1–1.0)
Monocytes Relative: 6 %
Neutro Abs: 4.7 10*3/uL (ref 1.7–7.7)
Neutrophils Relative %: 70 %
Platelets: 359 10*3/uL (ref 150–400)
RBC: 3.12 MIL/uL — ABNORMAL LOW (ref 4.22–5.81)
RDW: 14.6 % (ref 11.5–15.5)
WBC: 6.7 10*3/uL (ref 4.0–10.5)
nRBC: 0 % (ref 0.0–0.2)

## 2018-07-28 LAB — GLUCOSE, CAPILLARY
Glucose-Capillary: 101 mg/dL — ABNORMAL HIGH (ref 70–99)
Glucose-Capillary: 151 mg/dL — ABNORMAL HIGH (ref 70–99)
Glucose-Capillary: 130 mg/dL — ABNORMAL HIGH (ref 70–99)
Glucose-Capillary: 85 mg/dL (ref 70–99)

## 2018-07-28 MED ORDER — SODIUM CHLORIDE 0.9 % IV SOLN
1.5000 g | Freq: Four times a day (QID) | INTRAVENOUS | Status: DC
  Administered 2018-07-28 – 2018-07-29 (×4): 1.5 g via INTRAVENOUS
  Filled 2018-07-28 (×8): qty 1.5

## 2018-07-28 NOTE — Progress Notes (Addendum)
[place] at [place] NAME: Lucas Jackson    MR#:  [id number]  DATE OF BIRTH:  1927-10-16  SUBJECTIVE:   Chief Complaint  Patient presents with  . Toe Pain   Patient doing well this morning. No fevers reported. Slightly confused this morning but was able to tell me his name and DOB.  REVIEW OF SYSTEMS:  Review of Systems  Constitutional: Negative for chills and fever.  HENT: Negative for sore throat.   Respiratory: Negative for cough, shortness of breath and wheezing.   Cardiovascular: Negative for chest pain and leg swelling.  Gastrointestinal: Negative for abdominal pain, diarrhea, nausea and vomiting.  Genitourinary: Negative for dysuria.  Musculoskeletal: Negative for back pain and myalgias.  Neurological: Negative for dizziness, sensory change, speech change, focal weakness and headaches.   DRUG ALLERGIES:   Allergies  Allergen Reactions  . 5-Alpha Reductase Inhibitors    VITALS:  Blood pressure 135/65, pulse (!) 59, temperature 98.4 F (36.9 C), temperature source Oral, resp. rate (!) 22, height 5\' 11"  (1.803 m), weight 82 kg, SpO2 93 %. PHYSICAL EXAMINATION:   GENERAL:  85 y.o.-year-old patient lying in the bed with no acute distress.  EYES: Pupils equal, round, reactive to light and accommodation. No scleral icterus. Extraocular muscles intact.  HEENT: Head atraumatic, normocephalic. Oropharynx and nasopharynx clear.  NECK:  Supple, no jugular venous distention. No thyroid enlargement, no tenderness.  LUNGS: Normal breath sounds bilaterally, no wheezing, rales,rhonchi or crepitation. No use of accessory muscles of respiration.  CARDIOVASCULAR: S1, S2 normal. No murmurs, rubs, or gallops.  ABDOMEN: Soft, nontender, nondistended. Bowel sounds present. No organomegaly or mass.  EXTREMITIES: Right great toe amputated 2nd toe right foot swollen purulent discharge noted  NEUROLOGIC: Cranial nerves II through XII are intact.  Muscle strength 5/5 in all extremities. Sensation intact. Gait not checked.  PSYCHIATRIC: The patient is alert and oriented x 3.  SKIN: OR dressing to right foot.   DATA REVIEWED:  LABORATORY PANEL:  Male CBC Recent Labs  Lab 07/28/18 0306  WBC 6.7  HGB 7.6*  HCT 23.7*  PLT 359   ------------------------------------------------------------------------------------------------------------------ Chemistries  Recent Labs  Lab 07/24/18 1612  07/26/18 0517 07/28/18 0306  NA 135   < > 135 138  K 4.0   < > 3.6 3.7  CL 99   < > 102 103  CO2 25   < > 25 26  GLUCOSE 146*   < > 100* 110*  BUN 44*   < > 27* 19  CREATININE 1.68*   < > 1.25* 1.11  CALCIUM 9.0   < > 8.4* 7.9*  MG  --   --  2.0  --   AST 25  --   --   --   ALT 19  --   --   --   ALKPHOS 85  --   --   --   BILITOT 1.0  --   --   --    < > = values in this interval not displayed.   RADIOLOGY:  No results found. ASSESSMENT AND PLAN:   85 y.o. male presenting with worsening right leg pain and ulceration laterally on the second toe of the right foot with drainage swelling and pain.  Patient has had previous amputation of the right great toe with some contracture of the lesser adjacent digits worsening due to loss of the great toe.  1.  Osteomyelitis of the second toe note on  X-ray and MRI right foot- status post amputation POD # 2. Wound healing well. -Cultures shows abundant staphylococcus aureus -Continue IV Vanco and Zosyn -Podiatry consult appreciated. Will need follow up with them post op in approximately 1 week in the outpatient clinic -Will need home health for wound care at discharge -ID consulted for abx management  2. Atherosclerotic occlusive disease bilateral lower extremities with gangrene s/p endovascular intervention -Vascular following -Will need follow up with them in 1 month at discharge  3. Acute kidney injury- Likely prerenal causes  -BUN/creatinine improved with IV fluid hydration.   -Holding  Metformin and nephrotoxins -Continue IVFs   4. Diabetes Mellitus Type 2  -HGB A1c 6.3 -Diabetic diet with sliding scale coverage with insulin -Patient does not check his sugars at home, will advise  Fingerstick checks at home.   5. Hypertension- stable - Continue amlodipine and Coreg  6. DVT prophylaxis subcu Lovenox daily  7. Atelectasis - Chest x-ray shows asymmetric right hilar and lung base opacity which could reflect atelectasis or infection -Patient clinically stable no evidence or s/s of infection on exam to support possible pneumonia -On abx as above    All the records are reviewed and case discussed with Care Management/Social Worker. Management plans discussed with the patient, family and they are in agreement.  CODE STATUS: Full Code  TOTAL TIME TAKING CARE OF THIS PATIENT: 37 minutes.   More than 50% of the time was spent in counseling/coordination of care: YES  POSSIBLE D/C IN 1-2 DAYS, DEPENDING ON CLINICAL CONDITION.   on 07/28/2018 at 1:51 PM  This patient was staffed with Dr. Stark Jock, Jude who personally evaluated patient, reviewed documentation and agreed with assessment and plan of care as above.  Rufina Falco, DNP, FNP-BC [facility] Nurse Practitioner   Between 7am to 6pm - Pager [phone]  After 6pm go to [email or web address] - Proofreader  [facility]  Office  [phone]  CC: Primary care physician; McLean-Scocuzza, Nino Glow, MD  Note: This dictation was prepared with Dragon dictation along with smaller phrase technology. Any transcriptional errors that result from this process are unintentional.

## 2018-07-28 NOTE — NC FL2 (Signed)
[place] LEVEL OF CARE SCREENING TOOL     IDENTIFICATION  Patient Name: Lucas Jackson Birthdate: 10-Jan-1928 Sex: male Admission Date (Current Location): 07/24/2018  [place] and [place] Number:  Engineering geologist and Address:  [facility], [address], [place],  [address]      Provider Number: [id number]  Attending Physician Name and Address:  Otila Back, MD  Relative Name and Phone Number:       Current Level of Care: Hospital Recommended Level of Care: [place] Prior Approval Number:    Date Approved/Denied:   PASRR Number: [id number]  Discharge Plan: SNF    Current Diagnoses: Patient Active Problem List   Diagnosis Date Noted  . Osteomyelitis ([place]) 07/24/2018  . Infestation by bed bug 06/09/2018  . Fatty liver 10/05/2017  . Aortic stenosis 10/05/2017  . Aortic regurgitation 10/05/2017  . Pulmonary HTN ([place]) 10/05/2017  . Cervical stenosis of spine 08/06/2017  . HTN (hypertension) 08/05/2017  . HLD (hyperlipidemia) 08/05/2017  . Type 2 diabetes mellitus without complications ([place]) 16/96/7893  . Meningioma ([place]) 08/05/2017  . Bilateral leg edema 08/05/2017  . Cardiac murmur 08/05/2017    Orientation RESPIRATION BLADDER Height & Weight     Self, Time, Place  Normal Incontinent Weight: 180 lb 12.4 oz (82 kg) Height:  5\' 11"  (180.3 cm)  BEHAVIORAL SYMPTOMS/MOOD NEUROLOGICAL BOWEL NUTRITION STATUS  (none) (none)   Diet(Carb modified )  AMBULATORY STATUS COMMUNICATION OF NEEDS Skin   Extensive Assist Verbally Surgical wounds(open wound on right foot )                       Personal Care Assistance Level of Assistance  Bathing, Feeding, Dressing Bathing Assistance: Limited assistance Feeding assistance: Independent Dressing Assistance: Limited assistance     Functional Limitations Info  Sight, Hearing, Speech Sight Info: Impaired Hearing Info: Adequate Speech Info: Adequate     SPECIAL CARE FACTORS FREQUENCY  PT (By licensed PT), OT (By licensed OT)     PT Frequency: 5 OT Frequency: 5            Contractures Contractures Info: Not present    Additional Factors Info  Code Status, Allergies Code Status Info: Full Code  Allergies Info:  5-alpha Reductase Inhibitors           Current Medications (07/28/2018):  This is the current hospital active medication list Current Facility-Administered Medications  Medication Dose Route Frequency Provider Last Rate Last Dose  . 0.9 %  sodium chloride infusion  250 mL Intravenous PRN Schnier, Dolores Lory, MD      . 0.9 %  sodium chloride infusion   Intravenous Continuous Schnier, Dolores Lory, MD 50 mL/hr at 07/28/18 0418    . 0.9 %  sodium chloride infusion  250 mL Intravenous PRN Schnier, Dolores Lory, MD      . acetaminophen (TYLENOL) tablet 650 mg  650 mg Oral Q6H PRN Schnier, Dolores Lory, MD   650 mg at 07/26/18 2037   Or  . acetaminophen (TYLENOL) suppository 650 mg  650 mg Rectal Q6H PRN Schnier, Dolores Lory, MD      . amLODipine (NORVASC) tablet 2.5 mg  2.5 mg Oral Daily Schnier, Dolores Lory, MD   2.5 mg at 07/28/18 0914  . ampicillin-sulbactam (UNASYN) 1.5 g in sodium chloride 0.9 % 100 mL IVPB  1.5 g Intravenous Q6H Ravishankar, Jayashree, MD      . carvedilol (COREG) tablet 25 mg  25 mg Oral BID Delana Meyer Dolores Lory, MD   25 mg at 07/28/18 0913  . clopidogrel (PLAVIX) tablet 75 mg  75 mg Oral Q breakfast Schnier, Dolores Lory, MD   75 mg at 07/28/18 0913  . enoxaparin (LOVENOX) injection 40 mg  40 mg Subcutaneous Q24H Schnier, Dolores Lory, MD   40 mg at 07/27/18 2139  . HYDROcodone-acetaminophen (NORCO/VICODIN) 5-325 MG per tablet 1-2 tablet  1-2 tablet Oral Q4H PRN Schnier, Dolores Lory, MD      . insulin aspart (novoLOG) injection 0-15 Units  0-15 Units Subcutaneous TID WC Schnier, Dolores Lory, MD   3 Units at 07/28/18 1240  . insulin aspart (novoLOG) injection 0-5 Units  0-5 Units Subcutaneous QHS Schnier, Dolores Lory, MD       . insulin aspart (novoLOG) injection 3 Units  3 Units Subcutaneous TID WC Schnier, Dolores Lory, MD   3 Units at 07/28/18 1240  . omega-3 acid ethyl esters (LOVAZA) capsule 1 g  1 g Oral Daily Schnier, Dolores Lory, MD   1 g at 07/28/18 0914  . ondansetron (ZOFRAN) tablet 4 mg  4 mg Oral Q6H PRN Schnier, Dolores Lory, MD       Or  . ondansetron [facility]) injection 4 mg  4 mg Intravenous Q6H PRN Schnier, Dolores Lory, MD      . senna-docusate (Senokot-S) tablet 1 tablet  1 tablet Oral QHS PRN Schnier, Dolores Lory, MD      . sodium chloride flush (NS) 0.9 % injection 3 mL  3 mL Intravenous Q12H Schnier, Dolores Lory, MD   3 mL at 07/26/18 2155  . sodium chloride flush (NS) 0.9 % injection 3 mL  3 mL Intravenous PRN Schnier, Dolores Lory, MD      . sodium chloride flush (NS) 0.9 % injection 3 mL  3 mL Intravenous Q12H Schnier, Dolores Lory, MD      . sodium chloride flush (NS) 0.9 % injection 3 mL  3 mL Intravenous PRN Schnier, Dolores Lory, MD         Discharge Medications: Please see discharge summary for a list of discharge medications.  Relevant Imaging Results:  Relevant Lab Results:   Additional Information SSN: [id number]  Annamaria Boots, [place]

## 2018-07-28 NOTE — Progress Notes (Signed)
[facility] Podiatry                                                      Patient Demographics  Lucas Jackson, is a 85 y.o. male   MRN: [id number]   DOB - 01-19-1928  Admit Date - 07/24/2018    Outpatient Primary MD for the patient is McLean-Scocuzza, Nino Glow, MD  Consult requested in the Hospital by Otila Back, MD, On 07/28/2018   With History of -  Past Medical History:  Diagnosis Date  . Diabetes mellitus without complication (Lucas Jackson)   . Hyperlipidemia   . Hypertension   . Osteomyelitis [facility])       Past Surgical History:  Procedure Laterality Date  . AMPUTATION TOE Right 07/26/2018   Procedure: AMPUTATION TOE 2ND TOE RIGHT FOOT;  Surgeon: Albertine Patricia, DPM;  Location: [facility] ORS;  Service: Podiatry;  Laterality: Right;  . APPENDECTOMY     1985  . BRAIN SURGERY  1961   removed bone from inside skull that was pressing on optic nerve  . HERNIA REPAIR     umbilical 3662   . LOWER EXTREMITY ANGIOGRAPHY Right 07/27/2018   Procedure: Lower Extremity Angiography possible intervention;  Surgeon: Katha Cabal, MD;  Location: [place] [facility];  Service: Cardiovascular;  Laterality: Right;  . TOE AMPUTATION     right great toe 2016    in for   Chief Complaint  Patient presents with  . Toe Pain     HPI  Lucas Jackson  is a 85 y.o. male, 2 days status post amputation second toe right foot in 1 day status post angioplasty with stenting to the right posterior tibial artery    Review of Systems patient is alert well oriented and pleasant this morning.  He states his pain is greatly reduced since amputation the digit and also the angioplasty done yesterday.  In addition to the HPI above,  No Fever-chills, No Headache, No changes with Vision or hearing, No problems swallowing food or Liquids, No Chest pain, Cough or Shortness of Breath, No  Abdominal pain, No Nausea or Vommitting, Bowel movements are regular, No Blood in stool or Urine, Currently has Foley catheter in place No new skin rashes or bruises, No new joints pains-aches,  No new weakness, tingling, numbness in any extremity, No recent weight gain or loss, No polyuria, polydypsia or polyphagia, No significant Mental Stressors.  A full 10 point Review of Systems was done, except as stated above, all other Review of Systems were negative.   Social History Social History   Tobacco Use  . Smoking status: Former Smoker    Last attempt to quit: 1961    Years since quitting: 59.3  . Smokeless tobacco: Never Used  Substance Use Topics  . Alcohol use: No    Family History Family History  Problem Relation Age of Onset  . Colon cancer Brother   . Cancer Brother        colon   . Stroke Brother   . Heart disease Daughter        MI    Prior to Admission medications   Medication Sig Start Date End Date Taking? Authorizing Provider  amLODipine (NORVASC) 2.5 MG tablet Take 1 tablet (2.5 mg total) by mouth daily. 06/09/18  Yes McLean-Scocuzza, Nino Glow, MD  aspirin EC 81 MG tablet Take 1 tablet (81 mg total) by mouth daily. 10/28/17  Yes McLean-Scocuzza, Nino Glow, MD  carvedilol (COREG) 25 MG tablet Take 1 tablet (25 mg total) by mouth 2 (two) times daily. 02/10/18  Yes McLean-Scocuzza, Nino Glow, MD  chlorthalidone (HYGROTON) 25 MG tablet Take 1 tablet (25 mg total) by mouth daily. 02/10/18  Yes McLean-Scocuzza, Nino Glow, MD  metFORMIN (GLUCOPHAGE-XR) 500 MG 24 hr tablet Take 1 tablet (500 mg total) by mouth daily. With food 02/10/18  Yes McLean-Scocuzza, Nino Glow, MD  Omega-3 Fatty Acids (FISH OIL) 1000 MG CAPS Take 1 capsule (1,000 mg total) by mouth 2 (two) times daily. 02/10/18  Yes McLean-Scocuzza, Nino Glow, MD  telmisartan (MICARDIS) 80 MG tablet Take 1 tablet (80 mg total) by mouth daily. 02/10/18  Yes McLean-Scocuzza, Nino Glow, MD  acetaminophen (TYLENOL) 500 MG tablet Take  1,000 mg by mouth 2 (two) times daily.    [provider]  diclofenac sodium (VOLTAREN) 1 % GEL Apply 2 g topically 4 (four) times daily. B/l knees. Can use up to 4 grams 03/09/18   McLean-Scocuzza, Nino Glow, MD  traMADol (ULTRAM) 50 MG tablet Take 1 tablet (50 mg total) by mouth 2 (two) times daily as needed. 10/28/17   McLean-Scocuzza, Nino Glow, MD  tuberculin (APLISOL) 5 UNIT/0.1ML injection Inject into the skin.    [provider]    Anti-infectives (From admission, onward)   Start     Dose/Rate Route Frequency Ordered Stop   07/27/18 0000  ceFAZolin (ANCEF) IVPB 1 g/50 mL premix    Note to Pharmacy:  Send with pt to OR   1 g 100 mL/hr over 30 Minutes Intravenous On call 07/26/18 1338 07/28/18 0000   07/26/18 0500  vancomycin (VANCOCIN) 1,500 mg in sodium chloride 0.9 % 500 mL IVPB  Status:  Discontinued     1,500 mg 250 mL/hr over 120 Minutes Intravenous Every 36 hours 07/24/18 1752 07/27/18 1723   07/24/18 2300  piperacillin-tazobactam (ZOSYN) IVPB 3.375 g     3.375 g 12.5 mL/hr over 240 Minutes Intravenous Every 8 hours 07/24/18 1733     07/24/18 1745  vancomycin (VANCOCIN) IVPB 1000 mg/200 mL premix     1,000 mg 200 mL/hr over 60 Minutes Intravenous STAT 07/24/18 1738 07/24/18 2158   07/24/18 1600  vancomycin (VANCOCIN) IVPB 1000 mg/200 mL premix     1,000 mg 200 mL/hr over 60 Minutes Intravenous  Once 07/24/18 1545 07/24/18 1756   07/24/18 1600  piperacillin-tazobactam (ZOSYN) IVPB 3.375 g     3.375 g 100 mL/hr over 30 Minutes Intravenous  Once 07/24/18 1545 07/24/18 1701      Scheduled Meds: . amLODipine  2.5 mg Oral Daily  . carvedilol  25 mg Oral BID  . clopidogrel  75 mg Oral Q breakfast  . enoxaparin (LOVENOX) injection  40 mg Subcutaneous Q24H  . insulin aspart  0-15 Units Subcutaneous TID WC  . insulin aspart  0-5 Units Subcutaneous QHS  . insulin aspart  3 Units Subcutaneous TID WC  . omega-3 acid ethyl esters  1 g Oral Daily  . sodium chloride  flush  3 mL Intravenous Q12H  . sodium chloride flush  3 mL Intravenous Q12H   Continuous Infusions: . sodium chloride    . sodium chloride 50 mL/hr at 07/28/18 0418  . sodium chloride    . piperacillin-tazobactam (ZOSYN)  IV 3.375 g (07/28/18 0507)   PRN Meds:.sodium chloride, sodium chloride, acetaminophen **OR** acetaminophen,  HYDROcodone-acetaminophen, ondansetron **OR** ondansetron (ZOFRAN) IV, senna-docusate, sodium chloride flush, sodium chloride flush  Allergies  Allergen Reactions  . 5-Alpha Reductase Inhibitors     Physical Exam  Vitals  Blood pressure 135/65, pulse (!) 59, temperature 98.4 F (36.9 C), temperature source Oral, resp. rate (!) 22, height 5\' 11"  (1.803 m), weight 82 kg, SpO2 93 %.  Lower Extremity exam: Examination of the foot today shows that it is in good shape there is no swelling or redness there is a little bit of serous drainage from 1 aspect of the wound but that is minimal overall the incision margin is intact and stable. White count is down within normal range at this point.  V CBC Recent Labs  Lab 07/24/18 1612 07/25/18 0451 07/26/18 0517 07/28/18 0306  WBC 10.8* 10.4 9.2 6.7  HGB 9.7* 8.5* 8.1* 7.6*  HCT 29.4* 26.5* 25.1* 23.7*  PLT 340 332 306 359  MCV 75.4* 75.7* 75.4* 76.0*  MCH 24.9* 24.3* 24.3* 24.4*  MCHC 33.0 32.1 32.3 32.1  RDW 14.5 14.3 14.4 14.6  LYMPHSABS 0.8  --  1.3 1.4  MONOABS 0.6  --  0.6 0.4  EOSABS 0.0  --  0.2 0.1  BASOSABS 0.0  --  0.0 0.0   ------------------------------------------------------------------------------------------------------------------  Chemistries  Recent Labs  Lab 07/24/18 1612 07/25/18 0451 07/26/18 0517 07/28/18 0306  NA 135 135 135 138  K 4.0 3.6 3.6 3.7  CL 99 102 102 103  CO2 25 23 25 26   GLUCOSE 146* 128* 100* 110*  BUN 44* 37* 27* 19  CREATININE 1.68* 1.32* 1.25* 1.11  CALCIUM 9.0 8.5* 8.4* 7.9*  MG  --   --  2.0  --   AST 25  --   --   --   ALT 19  --   --   --    ALKPHOS 85  --   --   --   BILITOT 1.0  --   --   --    ------------------------------------------------------------------------------------------------------------------ estimated creatinine clearance is 49 mL/min (by C-G formula based on SCr of 1.11 mg/dL). ------------------------------------------------------------------------------------------------------------------ No results for input(s): TSH, T4TOTAL, T3FREE, THYROIDAB in the last 72 hours.  Invalid input(s): FREET3 Urinalysis    Component Value Date/Time   COLORURINE YELLOW (A) 07/24/2018 1705   APPEARANCEUR CLEAR (A) 07/24/2018 1705   APPEARANCEUR Clear 08/05/2017 1601   LABSPEC 1.016 07/24/2018 1705   PHURINE 5.0 07/24/2018 1705   GLUCOSEU NEGATIVE 07/24/2018 1705   HGBUR SMALL (A) 07/24/2018 1705   BILIRUBINUR NEGATIVE 07/24/2018 1705   BILIRUBINUR Negative 08/05/2017 1601   KETONESUR NEGATIVE 07/24/2018 1705   PROTEINUR NEGATIVE 07/24/2018 1705   NITRITE NEGATIVE 07/24/2018 1705   LEUKOCYTESUR NEGATIVE 07/24/2018 1705     Imaging results:   No results found.  Assessment & Plan: Think the patient is doing very well at this juncture.  His infections under good control.  He is growing some staph aureus uncertain as to sensitivities this point.  His proximal margin on the metatarsal head resection was clean and did not show any inflammation or is evidence of osteomyelitis.  Overall I think he is good for oral antibiotics and discharge as soon as he meets all discharge criteria.  I will need him to get the dressing changed every other day and make sure he utilizes the postop shoe when he gets up to ambulate.  I would recommend that he only have bathroom privileges at this point until this heals up more effectively.  It will  be okay for him to move from bed to the couch or recliner also.  Active Problems:   Osteomyelitis [facility])   Family Communication: Plan discussed with patient   Albertine Patricia M.D on 07/28/2018 at  10:12 AM  Thank you for the consult, we will follow the patient with you in the Hospital.

## 2018-07-28 NOTE — Progress Notes (Signed)
[facility] Daily Progress Note   Subjective: 1 Day Post-Op:   1. Introduction catheter into right lower extremity 3rd order catheter placement  2. Contrast injection right lower extremity for distal runoff with additional 3rd order  3. Percutaneous transluminal angioplasty right posterior tibial artery to 2.5 mm              4. Star close closure left common femoral arteriotomy  Patient sleeping comfortable this AM. No complaints.   Objective: Vitals:   07/27/18 1851 07/27/18 2009 07/28/18 0423 07/28/18 0819  BP: (!) 142/66 127/68 137/69 135/65  Pulse: 66 68 63 (!) 59  Resp: 17 18 (!) 22   Temp: 98.4 F (36.9 C) 98.5 F (36.9 C) 98.6 F (37 C) 98.4 F (36.9 C)  TempSrc: Oral Oral Oral Oral  SpO2: 100% 97% 92% 93%  Weight:      Height:        Intake/Output Summary (Last 24 hours) at 07/28/2018 3354 Last data filed at 07/27/2018 1813 Gross per 24 hour  Intake 274.84 ml  Output 800 ml  Net -525.16 ml   Physical Exam: NAD CV: RRR Pulmonary: CTA Bilaterally Abdomen: Soft, Nontender, Nondistended, (+) Bowel Sounds GU: Foley intact - draining clear urine Left Groin: Access site intact, clean and dry. No drainage or swelling noted Vascular:  Right Lower Extremity: Thigh soft, calf soft. Podiatry dressing intact with minimal drainage noted. Extremity warm distally.   Laboratory: CBC    Component Value Date/Time   WBC 6.7 07/28/2018 0306   HGB 7.6 (L) 07/28/2018 0306   HGB 10.2 (L) 07/07/2014 0418   HCT 23.7 (L) 07/28/2018 0306   HCT 31.2 (L) 07/07/2014 0418   PLT 359 07/28/2018 0306   PLT 286 07/07/2014 0418   BMET    Component Value Date/Time   NA 138 07/28/2018 0306   NA 138 07/07/2014 0418   K 3.7 07/28/2018 0306   K 3.4 (L) 07/07/2014 0418   CL 103 07/28/2018 0306   CL 105 07/07/2014 0418   CO2 26 07/28/2018 0306   CO2 27 07/07/2014 0418   GLUCOSE 110 (H) 07/28/2018 0306   GLUCOSE 98 07/07/2014 0418   BUN 19 07/28/2018 0306   BUN 19 07/07/2014 0418   CREATININE 1.11 07/28/2018 0306   CREATININE 1.28 (H) 07/08/2014 0422   CALCIUM 7.9 (L) 07/28/2018 0306   CALCIUM 7.8 (L) 07/07/2014 0418   GFRNONAA 59 (L) 07/28/2018 0306   GFRNONAA 51 (L) 07/08/2014 0422   GFRAA >60 07/28/2018 0306   GFRAA 59 (L) 07/08/2014 0422   Assessment/Planning: The patient is an 85 year old male with multiple medical issues including PAD with gangrene s/p endovascular intervention to the right lower extremity  1) Following angioplasty the right posterior tibial now is now widely patent with less than 5% residual stenosis and demonstrates in-line flow. 2) Patient to follow up in our office in one month for continued surveillance  Discussed with Dr. Eber Hong Eren Puebla PA-C 07/28/2018 9:58 AM
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
## 2018-07-28 NOTE — Evaluation (Signed)
Physical Therapy Evaluation Patient Details Name: Lucas Jackson MRN: [id number] DOB: 1927-07-20 Today's Date: 07/28/2018   History of Present Illness  85 y/o male s/p R 2nd toe amputation 4/21 and angioplasty 4/22.  Clinical Impression  Pt willing to work with PT and showed good effort t/o the session, however he had more confidence than his function would warrant.  He did relatively well getting to EOB with light assist, but struggled to get to standing and then had unsteadiness and almost impulsive with walker manipulation and steppage during brief in-room bout of ambulation.  He was highly reliant on the walker and very stooped.  Pt able to follow some simple instructions but clearly would have significant issues with trying to go home alone or even with some regular assist.  PT can only safely recommend STR at this time.    Follow Up Recommendations SNF    Equipment Recommendations  Other (comment)(TBD at next venue of care)    Recommendations for Other Services       Precautions / Restrictions Precautions Precautions: Fall Required Braces or Orthoses: (post-op shoe with WBing) Restrictions Weight Bearing Restrictions: No Other Position/Activity Restrictions: must wear post-op boot, bathroom privilage distances only      Mobility  Bed Mobility Overal bed mobility: Needs Assistance Bed Mobility: Supine to Sit     Supine to sit: Min assist     General bed mobility comments: Pt showed good effort in getting up to EOB, did need some assist but was able to do a majority of the transfer without assist  Transfers Overall transfer level: Needs assistance Equipment used: Rolling walker (2 wheeled) Transfers: Sit to/from Stand Sit to Stand: Mod assist         General transfer comment: Pt with poor UE strength, had difficulty leveraging enough to get weight overtop of feet and ultimately needed considerable assist to attain standing from raised bed.    Ambulation/Gait Ambulation/Gait assistance: Min assist;Mod assist Gait Distance (Feet): 15 Feet Assistive device: Rolling walker (2 wheeled)       General Gait Details: Pt was able to take a few steps with post-op shoe and heavy reliance on the walker.  Pt showed good effort but fatigued very quickly and showed poor ability to manage walker and maintain appropriate posture, steppage, etc  Stairs            Wheelchair Mobility    Modified Rankin (Stroke Patients Only)       Balance Overall balance assessment: Needs assistance Sitting-balance support: Single extremity supported Sitting balance-Leahy Scale: Good Sitting balance - Comments: Pt was able to maintain sitting balance with good confidence and tolerance   Standing balance support: Bilateral upper extremity supported Standing balance-Leahy Scale: Poor Standing balance comment: highly reliant on walker and with more confidence than his actual balance warrants in standing                             Pertinent Vitals/Pain Pain Assessment: No/denies pain(Pt reports no pain in R foot/LE)    Home Living Family/patient expects to be discharged to:: Skilled nursing facility Living Arrangements: Alone Available Help at Discharge: Family;Available PRN/intermittently                  Prior Function Level of Independence: Independent with assistive device(s)         Comments: Pt reports that he normally uses only a SPC and per report is able to get  out of the house with some regularity (appears that he does not drive)     Hand Dominance        Extremity/Trunk Assessment   Upper Extremity Assessment Upper Extremity Assessment: Generalized weakness(no L shld elevation, v. limited R, elbows/hands 4- to 3+/5)    Lower Extremity Assessment Lower Extremity Assessment: Generalized weakness(grossly 4-/5 t/o, some AROM t/o but functionally weak)       Communication   Communication: No  difficulties(poor vision)  Cognition Arousal/Alertness: Awake/alert Behavior During Therapy: WFL for tasks assessed/performed Overall Cognitive Status: Within Functional Limits for tasks assessed                                        General Comments      Exercises     Assessment/Plan    PT Assessment Patient needs continued PT services  PT Problem List Decreased strength;Decreased range of motion;Decreased activity tolerance;Decreased balance;Decreased mobility;Decreased cognition;Decreased knowledge of use of DME;Decreased safety awareness       PT Treatment Interventions DME instruction;Gait training;Stair training;Functional mobility training;Therapeutic activities;Therapeutic exercise;Balance training;Neuromuscular re-education;Patient/family education    PT Goals (Current goals can be found in the Care Plan section)  Acute Rehab PT Goals Patient Stated Goal: get back home PT Goal Formulation: With patient Time For Goal Achievement: 08/11/18 Potential to Achieve Goals: Fair    Frequency 7X/week   Barriers to discharge        Co-evaluation               AM-PAC PT "6 Clicks" Mobility  Outcome Measure Help needed turning from your back to your side while in a flat bed without using bedrails?: A Little Help needed moving from lying on your back to sitting on the side of a flat bed without using bedrails?: A Little Help needed moving to and from a bed to a chair (including a wheelchair)?: A Lot Help needed standing up from a chair using your arms (e.g., wheelchair or bedside chair)?: A Lot Help needed to walk in hospital room?: A Lot Help needed climbing 3-5 steps with a railing? : Total 6 Click Score: 13    End of Session Equipment Utilized During Treatment: Gait belt Activity Tolerance: Patient limited by fatigue Patient left: with call bell/phone within reach;with chair alarm set Nurse Communication: Mobility status PT Visit Diagnosis:  Muscle weakness (generalized) (M62.81);Difficulty in walking, not elsewhere classified (R26.2)    Time: 0626-9485 PT Time Calculation (min) (ACUTE ONLY): 32 min   Charges:   PT Evaluation $PT Eval Low Complexity: 1 Low PT Treatments $Therapeutic Activity: 8-22 mins        Kreg Shropshire, DPT 07/28/2018, 4:58 PM

## 2018-07-28 NOTE — Consult Note (Addendum)
NAME: Lucas Jackson  DOB: 1927-05-17  MRN: [id number]  Date/Time: 07/28/2018 3:26 PM  REQUESTING PROVIDER:ojie Subjective:  REASON FOR CONSULT: rt foot infection ? Lucas Jackson is a 85 y.o. male with a history of DM, HTN, h/o rt great toe amputation for osteo in 2016 was admitted with rt 2nd toe pain with foul-smelling discharge and pus coming out between the right foot second and third toes on 4/19.PT says this was going on for a few days and as he cannot see well he did not see anything on his feet but felt the pain and stickiness In the Ed had a temp of 101.1, WBC was 10.8, cultures sent and he was started on Vanco and zosyn. Seen by podiatrist and vascular. Underwent 2 nd ray amputation on 07/26/18. Had angio on 07/27/18 and  Percutaneous transluminal angioplasty right posterior tibial artery to 2.5 mm . I am asked to see him for antibiotic recommendation Pt is doing well and says he is feeling fine. He lives on his own Poor eyesight   Past Medical History:  Diagnosis Date  . Diabetes mellitus without complication ([place])   . Hyperlipidemia   . Hypertension   . Osteomyelitis [facility])     Past Surgical History:  Procedure Laterality Date  . AMPUTATION TOE Right 07/26/2018   Procedure: AMPUTATION TOE 2ND TOE RIGHT FOOT;  Surgeon: Albertine Patricia, DPM;  Location: [facility] ORS;  Service: Podiatry;  Laterality: Right;  . APPENDECTOMY     1985  . BRAIN SURGERY  1961   removed bone from inside skull that was pressing on optic nerve  . HERNIA REPAIR     umbilical 5361   . LOWER EXTREMITY ANGIOGRAPHY Right 07/27/2018   Procedure: Lower Extremity Angiography possible intervention;  Surgeon: Katha Cabal, MD;  Location: [facility];  Service: Cardiovascular;  Laterality: Right;  . TOE AMPUTATION     right great toe 2016    Social History   Socioeconomic History  . Marital status: Widowed    Spouse name: Not on file  . Number of children: 3  . Years of education: Not on  file  . Highest education level: Not on file  Occupational History  . Not on file  Social Needs  . Financial resource strain: Not on file  . Food insecurity:    Worry: Not on file    Inability: Not on file  . Transportation needs:    Medical: Not on file    Non-medical: Not on file  Tobacco Use  . Smoking status: Former Smoker    Last attempt to quit: 1961    Years since quitting: 59.3  . Smokeless tobacco: Never Used  Substance and Sexual Activity  . Alcohol use: No  . Drug use: Never  . Sexual activity: Not Currently  Lifestyle  . Physical activity:    Days per week: Not on file    Minutes per session: Not on file  . Stress: Not on file  Relationships  . Social connections:    Talks on phone: Not on file    Gets together: Not on file    Attends religious service: Not on file    Active member of club or organization: Not on file    Attends meetings of clubs or organizations: Not on file    Relationship status: Not on file  . Intimate partner violence:    Fear of current or ex partner: Not on file    Emotionally abused: Not on  file    Physically abused: Not on file    Forced sexual activity: Not on file  Other Topics Concern  . Not on file  Social History Narrative   3 kids    Niece Peggyann [place] [phone] involved in care    Owns guns, wears seat belts, safe in relationship   Retired, widower   Some high school ed    Likes car detailing    Lives at home with 2 relatives     Family History  Problem Relation Age of Onset  . Colon cancer Brother   . Cancer Brother        colon   . Stroke Brother   . Heart disease Daughter        MI   Allergies  Allergen Reactions  . 5-Alpha Reductase Inhibitors    I? Current Facility-Administered Medications  Medication Dose Route Frequency Provider Last Rate Last Dose  . 0.9 %  sodium chloride infusion  250 mL Intravenous PRN Schnier, Dolores Lory, MD      . 0.9 %  sodium chloride infusion   Intravenous Continuous  Schnier, Dolores Lory, MD 50 mL/hr at 07/28/18 0418    . 0.9 %  sodium chloride infusion  250 mL Intravenous PRN Schnier, Dolores Lory, MD      . acetaminophen (TYLENOL) tablet 650 mg  650 mg Oral Q6H PRN Schnier, Dolores Lory, MD   650 mg at 07/26/18 2037   Or  . acetaminophen (TYLENOL) suppository 650 mg  650 mg Rectal Q6H PRN Schnier, Dolores Lory, MD      . amLODipine (NORVASC) tablet 2.5 mg  2.5 mg Oral Daily Schnier, Dolores Lory, MD   2.5 mg at 07/28/18 0914  . carvedilol (COREG) tablet 25 mg  25 mg Oral BID Delana Meyer Dolores Lory, MD   25 mg at 07/28/18 0913  . clopidogrel (PLAVIX) tablet 75 mg  75 mg Oral Q breakfast Schnier, Dolores Lory, MD   75 mg at 07/28/18 0913  . enoxaparin (LOVENOX) injection 40 mg  40 mg Subcutaneous Q24H Schnier, Dolores Lory, MD   40 mg at 07/27/18 2139  . HYDROcodone-acetaminophen (NORCO/VICODIN) 5-325 MG per tablet 1-2 tablet  1-2 tablet Oral Q4H PRN Schnier, Dolores Lory, MD      . insulin aspart (novoLOG) injection 0-15 Units  0-15 Units Subcutaneous TID WC Schnier, Dolores Lory, MD   2 Units at 07/26/18 1727  . insulin aspart (novoLOG) injection 0-5 Units  0-5 Units Subcutaneous QHS Schnier, Dolores Lory, MD      . insulin aspart (novoLOG) injection 3 Units  3 Units Subcutaneous TID WC Schnier, Dolores Lory, MD   3 Units at 07/26/18 1727  . omega-3 acid ethyl esters (LOVAZA) capsule 1 g  1 g Oral Daily Schnier, Dolores Lory, MD   1 g at 07/28/18 0914  . ondansetron (ZOFRAN) tablet 4 mg  4 mg Oral Q6H PRN Schnier, Dolores Lory, MD       Or  . ondansetron [facility]) injection 4 mg  4 mg Intravenous Q6H PRN Schnier, Dolores Lory, MD      . piperacillin-tazobactam (ZOSYN) IVPB 3.375 g  3.375 g Intravenous Q8H Schnier, Dolores Lory, MD 12.5 mL/hr at 07/28/18 0507 3.375 g at 07/28/18 0507  . senna-docusate (Senokot-S) tablet 1 tablet  1 tablet Oral QHS PRN Schnier, Dolores Lory, MD      . sodium chloride flush (NS) 0.9 % injection 3 mL  3 mL Intravenous Q12H Schnier, Dolores Lory, MD  3 mL at 07/26/18 2155  . sodium  chloride flush (NS) 0.9 % injection 3 mL  3 mL Intravenous PRN Schnier, Dolores Lory, MD      . sodium chloride flush (NS) 0.9 % injection 3 mL  3 mL Intravenous Q12H Schnier, Dolores Lory, MD      . sodium chloride flush (NS) 0.9 % injection 3 mL  3 mL Intravenous PRN Schnier, Dolores Lory, MD         Abtx:  Anti-infectives (From admission, onward)   Start     Dose/Rate Route Frequency Ordered Stop   07/27/18 0000  ceFAZolin (ANCEF) IVPB 1 g/50 mL premix    Note to Pharmacy:  Send with pt to OR   1 g 100 mL/hr over 30 Minutes Intravenous On call 07/26/18 1338 07/28/18 0000   07/26/18 0500  vancomycin (VANCOCIN) 1,500 mg in sodium chloride 0.9 % 500 mL IVPB  Status:  Discontinued     1,500 mg 250 mL/hr over 120 Minutes Intravenous Every 36 hours 07/24/18 1752 07/27/18 1723   07/24/18 2300  piperacillin-tazobactam (ZOSYN) IVPB 3.375 g     3.375 g 12.5 mL/hr over 240 Minutes Intravenous Every 8 hours 07/24/18 1733     07/24/18 1745  vancomycin (VANCOCIN) IVPB 1000 mg/200 mL premix     1,000 mg 200 mL/hr over 60 Minutes Intravenous STAT 07/24/18 1738 07/24/18 2158   07/24/18 1600  vancomycin (VANCOCIN) IVPB 1000 mg/200 mL premix     1,000 mg 200 mL/hr over 60 Minutes Intravenous  Once 07/24/18 1545 07/24/18 1756   07/24/18 1600  piperacillin-tazobactam (ZOSYN) IVPB 3.375 g     3.375 g 100 mL/hr over 30 Minutes Intravenous  Once 07/24/18 1545 07/24/18 1701      REVIEW OF SYSTEMS:  Const:  fever, chills, negative weight loss Eyes: legally blind ENT: negative coryza, negative sore throat Resp: negative cough, hemoptysis, dyspnea Cards: negative for chest pain, palpitations, lower extremity edema GU: negative for frequency, dysuria and hematuria GI: Negative for abdominal pain, diarrhea, bleeding, constipation Skin: negative for rash and pruritus Heme: negative for easy bruising and gum/nose bleeding MS: negative for myalgias, arthralgias, back pain and muscle weakness Neurolo:negative for  headaches, dizziness, vertigo, memory problems  Psych: negative for feelings of anxiety, depression  Endocrine: no polyuria or polydipsia Allergy/Immunology- negative for any medication or food allergies   Objective:  VITALS:  BP 135/65 (BP Location: Left Arm)   Pulse (!) 59   Temp 98.4 F (36.9 C) (Oral)   Resp (!) 22   Ht 5\' 11"  (1.803 m)   Wt 82 kg   SpO2 93%   BMI 25.21 kg/m  PHYSICAL EXAM:  General: Alert, cooperative, no distress, appears stated age.  Head: Normocephalic, without obvious abnormality, atraumatic. Eyes:eyes closed ENT Nares normal. No drainage or sinus tenderness. edentulous Neck: Supple, symmetrical, no adenopathy, thyroid: non tender no carotid bruit and no JVD. Back: No CVA tenderness. Lungs: Clear to auscultation bilaterally. No Wheezing or Rhonchi. No rales. Heart: Regular rate and rhythm, no murmur, rub or gallop. Abdomen: Soft, non-tender,not distended. Bowel sounds normal. No masses Extremities: rt foot   Rt great toe amputated 2nd toe has been amputated and the site is clean with no erythema, some blood Skin: No rashes or lesions. Or bruising Lymph: Cervical, supraclavicular normal. Neurologic: Grossly non-focal Pertinent Labs Lab Results CBC    Component Value Date/Time   WBC 6.7 07/28/2018 0306   RBC 3.12 (L) 07/28/2018 0306   HGB 7.6 (L)  07/28/2018 0306   HGB 10.2 (L) 07/07/2014 0418   HCT 23.7 (L) 07/28/2018 0306   HCT 31.2 (L) 07/07/2014 0418   PLT 359 07/28/2018 0306   PLT 286 07/07/2014 0418   MCV 76.0 (L) 07/28/2018 0306   MCV 74 (L) 07/07/2014 0418   MCH 24.4 (L) 07/28/2018 0306   MCHC 32.1 07/28/2018 0306   RDW 14.6 07/28/2018 0306   RDW 14.7 (H) 07/07/2014 0418   LYMPHSABS 1.4 07/28/2018 0306   LYMPHSABS 1.5 07/07/2014 0418   MONOABS 0.4 07/28/2018 0306   MONOABS 0.4 07/07/2014 0418   EOSABS 0.1 07/28/2018 0306   EOSABS 0.1 07/07/2014 0418   BASOSABS 0.0 07/28/2018 0306   BASOSABS 0.0 07/07/2014 0418    CMP  Latest Ref Rng & Units 07/28/2018 07/26/2018 07/25/2018  Glucose 70 - 99 mg/dL 110(H) 100(H) 128(H)  BUN 8 - 23 mg/dL 19 27(H) 37(H)  Creatinine 0.61 - 1.24 mg/dL 1.11 1.25(H) 1.32(H)  Sodium 135 - 145 mmol/L 138 135 135  Potassium 3.5 - 5.1 mmol/L 3.7 3.6 3.6  Chloride 98 - 111 mmol/L 103 102 102  CO2 22 - 32 mmol/L 26 25 23   Calcium 8.9 - 10.3 mg/dL 7.9(L) 8.4(L) 8.5(L)  Total Protein 6.5 - 8.1 g/dL - - -  Total Bilirubin 0.3 - 1.2 mg/dL - - -  Alkaline Phos 38 - 126 U/L - - -  AST 15 - 41 U/L - - -  ALT 0 - 44 U/L - - -      Microbiology: Recent Results (from the past 240 hour(s))  Blood Culture (routine x 2)     Status: None (Preliminary result)   Collection Time: 07/24/18  4:16 PM  Result Value Ref Range Status   Specimen Description BLOOD RAC  Final   Special Requests   Final    BOTTLES DRAWN AEROBIC AND ANAEROBIC Blood Culture results may not be optimal due to an excessive volume of blood received in culture bottles   Culture   Final    NO GROWTH 4 DAYS Performed at [facility], [address], [place], [place] [address]    Report Status PENDING  Incomplete  Blood Culture (routine x 2)     Status: None (Preliminary result)   Collection Time: 07/24/18  4:16 PM  Result Value Ref Range Status   Specimen Description BLOOD LAC  Final   Special Requests   Final    BOTTLES DRAWN AEROBIC AND ANAEROBIC Blood Culture results may not be optimal due to an inadequate volume of blood received in culture bottles   Culture   Final    NO GROWTH 4 DAYS Performed at [facility], [address], [place], [place] [address]    Report Status PENDING  Incomplete  Urine culture     Status: Abnormal   Collection Time: 07/24/18  5:05 PM  Result Value Ref Range Status   Specimen Description   Final    Urine Performed at [facility], [address], [place], [place] [address]    Special Requests   Final    NONE Performed at [facility], [address] [place],  [address]    Culture MULTIPLE SPECIES PRESENT, SUGGEST RECOLLECTION (A)  Final   Report Status 07/25/2018 FINAL  Final  Aerobic/Anaerobic Culture (surgical/deep wound)     Status: None (Preliminary result)   Collection Time: 07/25/18  9:54 AM  Result Value Ref Range Status   Specimen Description   Final  FOOT RIGHT Performed at [facility], [place] [address], [place], [place] [address]    Special Requests   Final    NONE Performed at [facility], [place], [place] [address]    Gram Stain   Final    FEW WBC PRESENT, PREDOMINANTLY PMN ABUNDANT GRAM POSITIVE COCCI RARE GRAM NEGATIVE RODS Performed at [facility], [place] [address], [place], [place] [address]    Culture   Final    ABUNDANT STAPHYLOCOCCUS AUREUS NO ANAEROBES ISOLATED; CULTURE IN PROGRESS FOR 5 DAYS    Report Status PENDING  Incomplete   Organism ID, Bacteria STAPHYLOCOCCUS AUREUS  Final      Susceptibility   Staphylococcus aureus - MIC*    CIPROFLOXACIN <=0.5 SENSITIVE Sensitive     ERYTHROMYCIN <=0.25 SENSITIVE Sensitive     GENTAMICIN <=0.5 SENSITIVE Sensitive     OXACILLIN 0.5 SENSITIVE Sensitive     TETRACYCLINE <=1 SENSITIVE Sensitive     VANCOMYCIN <=0.5 SENSITIVE Sensitive     TRIMETH/SULFA <=10 SENSITIVE Sensitive     CLINDAMYCIN <=0.25 SENSITIVE Sensitive     RIFAMPIN <=0.5 SENSITIVE Sensitive     Inducible Clindamycin NEGATIVE Sensitive     * ABUNDANT STAPHYLOCOCCUS AUREUS  Aerobic/Anaerobic Culture (surgical/deep wound)     Status: None (Preliminary result)   Collection Time: 07/26/18  9:55 AM  Result Value Ref Range Status   Specimen Description   Final    WOUND Performed at [facility], [address] [place], [place] [address]    Special Requests   Final    NONE Performed at [facility], [place], [place] [address]    Gram Stain   Final    RARE WBC PRESENT, PREDOMINANTLY PMN FEW GRAM  POSITIVE COCCI IN PAIRS Performed at [facility], [place] [address], [place],  [address]    Culture   Final    ABUNDANT STAPHYLOCOCCUS AUREUS NO ANAEROBES ISOLATED; CULTURE IN PROGRESS FOR 5 DAYS    Report Status PENDING  Incomplete   Organism ID, Bacteria STAPHYLOCOCCUS AUREUS  Final      Susceptibility   Staphylococcus aureus - MIC*    CIPROFLOXACIN <=0.5 SENSITIVE Sensitive     ERYTHROMYCIN <=0.25 SENSITIVE Sensitive     GENTAMICIN <=0.5 SENSITIVE Sensitive     OXACILLIN 0.5 SENSITIVE Sensitive     TETRACYCLINE <=1 SENSITIVE Sensitive     VANCOMYCIN <=0.5 SENSITIVE Sensitive     TRIMETH/SULFA <=10 SENSITIVE Sensitive     CLINDAMYCIN <=0.25 SENSITIVE Sensitive     RIFAMPIN <=0.5 SENSITIVE Sensitive     Inducible Clindamycin NEGATIVE Sensitive     * ABUNDANT STAPHYLOCOCCUS AUREUS    IMAGING RESULTS: I have personally reviewed the films ? Impression/Recommendation ?85 y.o. male with a history of DM, HTN, h/o rt great toe amputation for osteo in 2016 was admitted with rt 2nd toe pain with foul-smelling discharge and pus coming out between the right foot second and third toes on 4/19.PT says this was going on for a few days and as he cannot see well he did not see anything on his feet but felt the pain and stickiness In the Ed had a temp of 101.1, WBC was 10.8, cultures sent and he was started on Vanco and zosyn. Seen by podiatrist and vascular. Underwent 2 nd ray amputation on 07/26/18. Had angio on 07/27/18 and  Percutaneous transluminal angioplasty right posterior tibial artery to 2.5 mm .  infection rt 2nd  toe with osteomyelitis with peripheral vascular disease. - s/p ray amputation and PTLA of posterior tibial Culture is MSSA and bone pathology shows clear margin for osteo He will not need IV antibiotics on discharge Was on vanco and zosyn and the vanco was Dc yesterday- change zosyn to unasyn Very likely home on augmentin for 2 weeks  DM-management as per primary  team  HTn-on amlodipine and carvedilol ? pt seen with Dr.Troxler _____________________________________________ Discussed with patient and the hospitalist .

## 2018-07-29 ENCOUNTER — Telehealth: Payer: Self-pay | Admitting: Internal Medicine

## 2018-07-29 DIAGNOSIS — D649 Anemia, unspecified: Secondary | ICD-10-CM | POA: Diagnosis not present

## 2018-07-29 DIAGNOSIS — E785 Hyperlipidemia, unspecified: Secondary | ICD-10-CM | POA: Diagnosis not present

## 2018-07-29 DIAGNOSIS — E569 Vitamin deficiency, unspecified: Secondary | ICD-10-CM | POA: Diagnosis not present

## 2018-07-29 DIAGNOSIS — N390 Urinary tract infection, site not specified: Secondary | ICD-10-CM | POA: Diagnosis not present

## 2018-07-29 DIAGNOSIS — R5381 Other malaise: Secondary | ICD-10-CM | POA: Diagnosis not present

## 2018-07-29 DIAGNOSIS — I10 Essential (primary) hypertension: Secondary | ICD-10-CM | POA: Diagnosis not present

## 2018-07-29 DIAGNOSIS — E7800 Pure hypercholesterolemia, unspecified: Secondary | ICD-10-CM | POA: Diagnosis not present

## 2018-07-29 DIAGNOSIS — Z7401 Bed confinement status: Secondary | ICD-10-CM | POA: Diagnosis not present

## 2018-07-29 DIAGNOSIS — R319 Hematuria, unspecified: Secondary | ICD-10-CM | POA: Diagnosis not present

## 2018-07-29 DIAGNOSIS — I739 Peripheral vascular disease, unspecified: Secondary | ICD-10-CM | POA: Diagnosis not present

## 2018-07-29 DIAGNOSIS — M25512 Pain in left shoulder: Secondary | ICD-10-CM | POA: Diagnosis not present

## 2018-07-29 DIAGNOSIS — M869 Osteomyelitis, unspecified: Secondary | ICD-10-CM | POA: Diagnosis not present

## 2018-07-29 DIAGNOSIS — M86171 Other acute osteomyelitis, right ankle and foot: Secondary | ICD-10-CM | POA: Diagnosis not present

## 2018-07-29 DIAGNOSIS — Z1159 Encounter for screening for other viral diseases: Secondary | ICD-10-CM | POA: Diagnosis not present

## 2018-07-29 DIAGNOSIS — R8290 Unspecified abnormal findings in urine: Secondary | ICD-10-CM | POA: Diagnosis not present

## 2018-07-29 DIAGNOSIS — Z4781 Encounter for orthopedic aftercare following surgical amputation: Secondary | ICD-10-CM | POA: Diagnosis not present

## 2018-07-29 DIAGNOSIS — M6281 Muscle weakness (generalized): Secondary | ICD-10-CM | POA: Diagnosis not present

## 2018-07-29 DIAGNOSIS — E119 Type 2 diabetes mellitus without complications: Secondary | ICD-10-CM | POA: Diagnosis not present

## 2018-07-29 DIAGNOSIS — Z7689 Persons encountering health services in other specified circumstances: Secondary | ICD-10-CM | POA: Diagnosis not present

## 2018-07-29 DIAGNOSIS — Z130 Encounter for screening for diseases of the blood and blood-forming organs and certain disorders involving the immune mechanism: Secondary | ICD-10-CM | POA: Diagnosis not present

## 2018-07-29 DIAGNOSIS — E1140 Type 2 diabetes mellitus with diabetic neuropathy, unspecified: Secondary | ICD-10-CM | POA: Diagnosis not present

## 2018-07-29 DIAGNOSIS — K5900 Constipation, unspecified: Secondary | ICD-10-CM | POA: Diagnosis not present

## 2018-07-29 DIAGNOSIS — J9811 Atelectasis: Secondary | ICD-10-CM | POA: Diagnosis not present

## 2018-07-29 DIAGNOSIS — M2550 Pain in unspecified joint: Secondary | ICD-10-CM | POA: Diagnosis not present

## 2018-07-29 DIAGNOSIS — R52 Pain, unspecified: Secondary | ICD-10-CM | POA: Diagnosis not present

## 2018-07-29 DIAGNOSIS — Z79899 Other long term (current) drug therapy: Secondary | ICD-10-CM | POA: Diagnosis not present

## 2018-07-29 DIAGNOSIS — R700 Elevated erythrocyte sedimentation rate: Secondary | ICD-10-CM | POA: Diagnosis not present

## 2018-07-29 LAB — CULTURE, BLOOD (ROUTINE X 2)
Culture: NO GROWTH
Culture: NO GROWTH

## 2018-07-29 LAB — CBC WITH DIFFERENTIAL/PLATELET
Abs Immature Granulocytes: 0.04 10*3/uL (ref 0.00–0.07)
Basophils Absolute: 0 10*3/uL (ref 0.0–0.1)
Basophils Relative: 1 %
Eosinophils Absolute: 0.1 10*3/uL (ref 0.0–0.5)
Eosinophils Relative: 2 %
HCT: 25.6 % — ABNORMAL LOW (ref 39.0–52.0)
Hemoglobin: 8.1 g/dL — ABNORMAL LOW (ref 13.0–17.0)
Immature Granulocytes: 1 %
Lymphocytes Relative: 28 %
Lymphs Abs: 1.4 10*3/uL (ref 0.7–4.0)
MCH: 24.3 pg — ABNORMAL LOW (ref 26.0–34.0)
MCHC: 31.6 g/dL (ref 30.0–36.0)
MCV: 76.6 fL — ABNORMAL LOW (ref 80.0–100.0)
Monocytes Absolute: 0.3 10*3/uL (ref 0.1–1.0)
Monocytes Relative: 6 %
Neutro Abs: 3.2 10*3/uL (ref 1.7–7.7)
Neutrophils Relative %: 62 %
Platelets: 396 10*3/uL (ref 150–400)
RBC: 3.34 MIL/uL — ABNORMAL LOW (ref 4.22–5.81)
RDW: 14.6 % (ref 11.5–15.5)
WBC: 5.1 10*3/uL (ref 4.0–10.5)
nRBC: 0 % (ref 0.0–0.2)

## 2018-07-29 LAB — BASIC METABOLIC PANEL
Anion gap: 8 (ref 5–15)
BUN: 15 mg/dL (ref 8–23)
CO2: 25 mmol/L (ref 22–32)
Calcium: 8 mg/dL — ABNORMAL LOW (ref 8.9–10.3)
Chloride: 107 mmol/L (ref 98–111)
Creatinine, Ser: 1.01 mg/dL (ref 0.61–1.24)
GFR calc Af Amer: >60 mL/min (ref >60)
GFR calc non Af Amer: >60 mL/min (ref >60)
Glucose, Bld: 89 mg/dL (ref 70–99)
Potassium: 3.9 mmol/L (ref 3.5–5.1)
Sodium: 140 mmol/L (ref 135–145)

## 2018-07-29 LAB — GLUCOSE, CAPILLARY
Glucose-Capillary: 92 mg/dL (ref 70–99)
Glucose-Capillary: 136 mg/dL — ABNORMAL HIGH (ref 70–99)

## 2018-07-29 LAB — SARS CORONAVIRUS 2 BY RT PCR (HOSPITAL ORDER, PERFORMED IN ~~LOC~~ HOSPITAL LAB): SARS Coronavirus 2: NEGATIVE

## 2018-07-29 MED ORDER — AMOXICILLIN-POT CLAVULANATE 875-125 MG PO TABS: 1.0000 | ORAL_TABLET | Freq: Two times a day (BID) | ORAL | 0 refills | Status: AC

## 2018-07-29 NOTE — Progress Notes (Signed)
Physical Therapy Treatment Patient Details Name: Lucas Jackson MRN: [id number] DOB: 1927/12/15 Today's Date: 07/29/2018    History of Present Illness 85 y/o male s/p R 2nd toe amputation 4/21 and angioplasty 4/22.  History includes diabetes, HTN, HLD    PT Comments    Pt continues to be willing to work with PT and showed good effort with all tasks.  He did need consistent and repeated cuing to do some of the bed exercises appropriately, but did manage relatively well with most tasks once clarified.  He was able to do some in-room ambulation, but even with heavy cuing he maintained choppy, stooped and relatively unsteady gait.    Follow Up Recommendations  SNF     Equipment Recommendations  (TBD at next venue of care)    Recommendations for Other Services       Precautions / Restrictions Precautions Precautions: Fall Required Braces or Orthoses: (R post-op boot when up) Restrictions Weight Bearing Restrictions: No Other Position/Activity Restrictions: must wear post-op boot, bathroom privilage distances only    Mobility  Bed Mobility Overal bed mobility: Needs Assistance Bed Mobility: Supine to Sit     Supine to sit: Min assist     General bed mobility comments: Pt showed good effort in getting to sitting at EOB, needed only light assist  Transfers Overall transfer level: Needs assistance Equipment used: Rolling walker (2 wheeled) Transfers: Sit to/from Stand Sit to Stand: Min assist         General transfer comment: Less assist required today to rise from raised bed, still struggles to attain appropriate position to get to/from chair as his L knee lacks flexion to 90 and R foot in shoe  Ambulation/Gait Ambulation/Gait assistance: Min assist Gait Distance (Feet): 35 Feet Assistive device: Rolling walker (2 wheeled)       General Gait Details: Pt continues to have forward stooped posture that appears to be baseline, despite constant cuing for more  consistent/less choppy steps with minimal improvements   Stairs             Wheelchair Mobility    Modified Rankin (Stroke Patients Only)       Balance Overall balance assessment: Needs assistance Sitting-balance support: Single extremity supported Sitting balance-Leahy Scale: Good Sitting balance - Comments: Pt was able to maintain sitting balance with good confidence and tolerance     Standing balance-Leahy Scale: Poor Standing balance comment: Pt continues to be very reliant on the walker and though he did not have any LOBs he was unsteady and inconsistent with all standing tasks                            Cognition Arousal/Alertness: Awake/alert Behavior During Therapy: WFL for tasks assessed/performed Overall Cognitive Status: Within Functional Limits for tasks assessed                                        Exercises General Exercises - Lower Extremity Ankle Circles/Pumps: AROM;10 reps(very limited ankle motion on R) Quad Sets: Strengthening;10 reps;Both Short Arc Quad: AROM;Strengthening;10 reps;Both Heel Slides: Strengthening;10 reps;Both(with resisted leg extension (light pressure through R heel)) Hip ABduction/ADduction: Strengthening;10 reps Straight Leg Raises: AROM;10 reps    General Comments        Pertinent Vitals/Pain Pain Assessment: No/denies pain    Home Living  Prior Function            PT Goals (current goals can now be found in the care plan section) Progress towards PT goals: Progressing toward goals    Frequency    7X/week      PT Plan Current plan remains appropriate    Co-evaluation              AM-PAC PT "6 Clicks" Mobility   Outcome Measure  Help needed turning from your back to your side while in a flat bed without using bedrails?: A Little Help needed moving from lying on your back to sitting on the side of a flat bed without using bedrails?: A  Little Help needed moving to and from a bed to a chair (including a wheelchair)?: A Lot Help needed standing up from a chair using your arms (e.g., wheelchair or bedside chair)?: A Lot Help needed to walk in hospital room?: A Lot Help needed climbing 3-5 steps with a railing? : Total 6 Click Score: 13    End of Session Equipment Utilized During Treatment: Gait belt Activity Tolerance: Patient limited by fatigue Patient left: with call bell/phone within reach;with chair alarm set Nurse Communication: Mobility status PT Visit Diagnosis: Muscle weakness (generalized) (M62.81);Difficulty in walking, not elsewhere classified (R26.2)     Time: 6606-3016 PT Time Calculation (min) (ACUTE ONLY): 31 min  Charges:  $Gait Training: 8-22 mins $Therapeutic Exercise: 8-22 mins                     Kreg Shropshire, DPT 07/29/2018, 12:18 PM

## 2018-07-29 NOTE — Progress Notes (Signed)
EMS is here to transport patient to [facility].

## 2018-07-29 NOTE — Progress Notes (Addendum)
Report called to Christine at [facility]. Patient is going to room 711. NT to prepare patient for transport. IV removed and belongings packed. AVS printed and sent with patient. EMS called.

## 2018-07-29 NOTE — Discharge Summary (Addendum)
[facility] NAME: Lucas Jackson    MR#:  [id number]  DATE OF BIRTH:  June 08, 1927  DATE OF ADMISSION:  07/24/2018   ADMITTING PHYSICIAN: Saundra Shelling, MD  DATE OF DISCHARGE: 07/29/18  PRIMARY CARE PHYSICIAN: McLean-Scocuzza, Nino Glow, MD   ADMISSION DIAGNOSIS:   Wound, open, foot [S91.309A] Other acute osteomyelitis of right foot ([place]) [M86.171]  DISCHARGE DIAGNOSIS:   Active Problems:   Osteomyelitis ([place])   SECONDARY DIAGNOSIS:   Past Medical History:  Diagnosis Date  . Diabetes mellitus without complication ([place])   . Hyperlipidemia   . Hypertension   . Osteomyelitis [facility])     HOSPITAL COURSE:  85 y.o. male presenting with worsening right leg pain and ulceration laterally on the second toe of the right foot with drainage swelling and pain.  Patient has had previous amputation of the right great toe with some contracture of the lesser adjacent digits worsening due to loss of the great toe.  1.  Osteomyelitis of the second toe noted on X-ray and MRI right foot- status post amputation POD # 3. Wound healing well. 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 Acute kidney injury-Likely prerenal causes . Improved with IVFs  4. Diabetes Mellitus Type 2 -HGB A1c 6.3 -Resume oral medication at discharge -Patient does not check his sugars at home, will advise Fingerstick checks at home.  5. Hypertension-stable -Continue amlodipine and Coreg  6. Atelectasis - Chest x-ray showed asymmetric right hilar and lung base opacity which could reflect atelectasis or infection -Patient  clinically stable there was no evidence or s/s of infection on exam to support possible pneumonia -Treated with abx as above -Patient tested negative for SARS-CoV-2 virus that causes COVID-19 as part of requirement prior to discharge to SNF.  Recommendations from podiatry:I will need him to get the dressing changed every other day and make sure he utilizes the postop shoe when he gets up to ambulate.  I would recommend that he only have bathroom privileges at this point until this heals up more effectively.  It will be okay for him to move from bed to the couch or recliner also.   DISCHARGE CONDITIONS:   stable CONSULTS OBTAINED:   Podiatry ID Vascular  DRUG ALLERGIES:   Allergies  Allergen Reactions  . 5-Alpha Reductase Inhibitors    DISCHARGE MEDICATIONS:   Allergies as of 07/29/2018      Reactions   5-alpha Reductase Inhibitors       Medication List    TAKE these medications   acetaminophen 500 MG tablet Commonly known as:  TYLENOL Take 1,000 mg by mouth 2 (two) times daily.   amLODipine 2.5 MG tablet Commonly known as:  NORVASC Take 1 tablet (2.5 mg total) by mouth daily.   amoxicillin-clavulanate 875-125 MG tablet Commonly known as:  Augmentin Take 1 tablet by mouth 2 (two) times daily for 14 days.   Aplisol 5 UNIT/0.1ML injection Generic drug:  tuberculin Inject into the skin.   aspirin EC 81 MG tablet Take 1 tablet (81 mg total) by mouth daily.   carvedilol 25 MG tablet Commonly known as:  COREG Take 1 tablet (25 mg  total) by mouth 2 (two) times daily.   chlorthalidone 25 MG tablet Commonly known as:  HYGROTON Take 1 tablet (25 mg total) by mouth daily.   diclofenac sodium 1 % Gel Commonly known as:  VOLTAREN Apply 2 g topically 4 (four) times daily. B/l knees. Can use up to 4 grams   Fish Oil 1000 MG Caps Take 1 capsule (1,000 mg total) by mouth 2 (two) times daily.   metFORMIN 500 MG 24 hr tablet Commonly known as:  GLUCOPHAGE-XR Take 1  tablet (500 mg total) by mouth daily. With food   telmisartan 80 MG tablet Commonly known as:  MICARDIS Take 1 tablet (80 mg total) by mouth daily.   traMADol 50 MG tablet Commonly known as:  ULTRAM Take 1 tablet (50 mg total) by mouth 2 (two) times daily as needed.        DISCHARGE INSTRUCTIONS:    DIET:   Diabetic diet  ACTIVITY:   Activity as tolerated  OXYGEN:   Home Oxygen: No.  Oxygen Delivery: room air  DISCHARGE LOCATION:   nursing home   If you experience worsening of your admission symptoms, develop shortness of breath, life threatening emergency, suicidal or homicidal thoughts you must seek medical attention immediately by calling 911 or calling your MD immediately  if symptoms less severe.  You Must read complete instructions/literature along with all the possible adverse reactions/side effects for all the Medicines you take and that have been prescribed to you. Take any new Medicines after you have completely understood and accpet all the possible adverse reactions/side effects.   Please note  You were cared for by a hospitalist during your hospital stay. If you have any questions about your discharge medications or the care you received while you were in the hospital after you are discharged, you can call the unit and asked to speak with the hospitalist on call if the hospitalist that took care of you is not available. Once you are discharged, your primary care physician will handle any further medical issues. Please note that NO REFILLS for any discharge medications will be authorized once you are discharged, as it is imperative that you return to your primary care physician (or establish a relationship with a primary care physician if you do not have one) for your aftercare needs so that they can reassess your need for medications and monitor your lab values.    On the day of Discharge:  VITAL SIGNS:   Blood pressure 135/67, pulse (!) 58, temperature  98.7 F (37.1 C), temperature source Oral, resp. rate 17, height 5\' 11"  (1.803 m), weight 82 kg, SpO2 94 %.  PHYSICAL EXAMINATION:    GENERAL:  85 y.o.-year-old patient lying in the bed with no acute distress.  EYES: Pupils equal, round, reactive to light and accommodation. No scleral icterus. Extraocular muscles intact.  HEENT: Head atraumatic, normocephalic. Oropharynx and nasopharynx clear.  NECK:  Supple, no jugular venous distention. No thyroid enlargement, no tenderness.  LUNGS: Normal breath sounds bilaterally, no wheezing, rales,rhonchi or crepitation. No use of accessory muscles of respiration.  CARDIOVASCULAR: S1, S2 normal. No murmurs, rubs, or gallops.  ABDOMEN: Soft, non-tender, non-distended. Bowel sounds present. No organomegaly or mass.  EXTREMITIES: No pedal edema, cyanosis, or clubbing.  NEUROLOGIC: Cranial nerves II through XII are intact. Muscle strength 5/5 in all extremities. Sensation intact. Gait not checked.  PSYCHIATRIC: The patient is alert and oriented x 3.  SKIN: No obvious rash, lesion, or ulcer.   DATA REVIEW:  CBC Recent Labs  Lab 07/29/18 0440  WBC 5.1  HGB 8.1*  HCT 25.6*  PLT 396    Chemistries  Recent Labs  Lab 07/24/18 1612  07/26/18 0517  07/29/18 0440  NA 135   < > 135   < > 140  K 4.0   < > 3.6   < > 3.9  CL 99   < > 102   < > 107  CO2 25   < > 25   < > 25  GLUCOSE 146*   < > 100*   < > 89  BUN 44*   < > 27*   < > 15  CREATININE 1.68*   < > 1.25*   < > 1.01  CALCIUM 9.0   < > 8.4*   < > 8.0*  MG  --   --  2.0  --   --   AST 25  --   --   --   --   ALT 19  --   --   --   --   ALKPHOS 85  --   --   --   --   BILITOT 1.0  --   --   --   --    < > = values in this interval not displayed.     Microbiology Results  Results for orders placed or performed during the hospital encounter of 07/24/18  Blood Culture (routine x 2)     Status: None   Collection Time: 07/24/18  4:16 PM  Result Value Ref Range Status   Specimen  Description BLOOD RAC  Final   Special Requests   Final    BOTTLES DRAWN AEROBIC AND ANAEROBIC Blood Culture results may not be optimal due to an excessive volume of blood received in culture bottles   Culture   Final    NO GROWTH 5 DAYS Performed at [facility], [address], [place], [place] [address]    Report Status 07/29/2018 FINAL  Final  Blood Culture (routine x 2)     Status: None   Collection Time: 07/24/18  4:16 PM  Result Value Ref Range Status   Specimen Description BLOOD LAC  Final   Special Requests   Final    BOTTLES DRAWN AEROBIC AND ANAEROBIC Blood Culture results may not be optimal due to an inadequate volume of blood received in culture bottles   Culture   Final    NO GROWTH 5 DAYS Performed at [facility], [address] [place], [place] [address]    Report Status 07/29/2018 FINAL  Final  Urine culture     Status: Abnormal   Collection Time: 07/24/18  5:05 PM  Result Value Ref Range Status   Specimen Description   Final    Urine Performed at [facility], [address] [place], [place] [address]    Special Requests   Final    NONE Performed at [facility], [address] [place], [place] [address]    Culture MULTIPLE SPECIES PRESENT, SUGGEST RECOLLECTION (A)  Final   Report Status 07/25/2018 FINAL  Final  Aerobic/Anaerobic Culture (surgical/deep wound)     Status: None (Preliminary result)   Collection Time: 07/25/18  9:54 AM  Result Value Ref Range Status   Specimen Description   Final    FOOT RIGHT Performed at [facility], [place] [address], [place], [place] [address]    Special Requests   Final  NONE Performed at [facility], [place], [place] [address]    Gram Stain   Final    FEW WBC PRESENT, PREDOMINANTLY PMN ABUNDANT GRAM POSITIVE COCCI RARE GRAM NEGATIVE RODS Performed at [facility], [place] [address] [place], [place] [address]    Culture   Final     ABUNDANT STAPHYLOCOCCUS AUREUS NO ANAEROBES ISOLATED; CULTURE IN PROGRESS FOR 5 DAYS    Report Status PENDING  Incomplete   Organism ID, Bacteria STAPHYLOCOCCUS AUREUS  Final      Susceptibility   Staphylococcus aureus - MIC*    CIPROFLOXACIN <=0.5 SENSITIVE Sensitive     ERYTHROMYCIN <=0.25 SENSITIVE Sensitive     GENTAMICIN <=0.5 SENSITIVE Sensitive     OXACILLIN 0.5 SENSITIVE Sensitive     TETRACYCLINE <=1 SENSITIVE Sensitive     VANCOMYCIN <=0.5 SENSITIVE Sensitive     TRIMETH/SULFA <=10 SENSITIVE Sensitive     CLINDAMYCIN <=0.25 SENSITIVE Sensitive     RIFAMPIN <=0.5 SENSITIVE Sensitive     Inducible Clindamycin NEGATIVE Sensitive     * ABUNDANT STAPHYLOCOCCUS AUREUS  Aerobic/Anaerobic Culture (surgical/deep wound)     Status: None (Preliminary result)   Collection Time: 07/26/18  9:55 AM  Result Value Ref Range Status   Specimen Description   Final    WOUND Performed at [facility], [address], [place], [place] [address]    Special Requests   Final    NONE Performed at [facility], [address] [place], [place] [address]    Gram Stain   Final    RARE WBC PRESENT, PREDOMINANTLY PMN FEW GRAM POSITIVE COCCI IN PAIRS Performed at [facility], [place] [address], [place],  [address]    Culture   Final    ABUNDANT STAPHYLOCOCCUS AUREUS NO ANAEROBES ISOLATED; CULTURE IN PROGRESS FOR 5 DAYS    Report Status PENDING  Incomplete   Organism ID, Bacteria STAPHYLOCOCCUS AUREUS  Final      Susceptibility   Staphylococcus aureus - MIC*    CIPROFLOXACIN <=0.5 SENSITIVE Sensitive     ERYTHROMYCIN <=0.25 SENSITIVE Sensitive     GENTAMICIN <=0.5 SENSITIVE Sensitive     OXACILLIN 0.5 SENSITIVE Sensitive     TETRACYCLINE <=1 SENSITIVE Sensitive     VANCOMYCIN <=0.5 SENSITIVE Sensitive     TRIMETH/SULFA <=10 SENSITIVE Sensitive     CLINDAMYCIN <=0.25 SENSITIVE Sensitive     RIFAMPIN <=0.5 SENSITIVE Sensitive     Inducible Clindamycin NEGATIVE  Sensitive     * ABUNDANT STAPHYLOCOCCUS AUREUS  SARS Coronavirus 2 [facility] order, Performed in [place] hospital lab)     Status: None   Collection Time: 07/29/18  9:31 AM  Result Value Ref Range Status   SARS Coronavirus 2 NEGATIVE NEGATIVE Final    Comment: (NOTE) If result is NEGATIVE SARS-CoV-2 target nucleic acids are NOT DETECTED. The SARS-CoV-2 RNA is generally detectable in upper and lower  respiratory specimens during the acute phase of infection. The lowest  concentration of SARS-CoV-2 viral copies this assay can detect is 250  copies / mL. A negative result does not preclude SARS-CoV-2 infection  and should not be used as the sole basis for treatment or other  patient management decisions.  A negative result may occur with  improper specimen collection / handling, submission of specimen other  than nasopharyngeal swab, presence of viral mutation(s) within the  areas targeted by this assay, and inadequate number of viral copies  (<250 copies / mL).  A negative result must be combined with clinical  observations, patient history, and epidemiological information. If result is POSITIVE SARS-CoV-2 target nucleic acids are DETECTED. The SARS-CoV-2 RNA is generally detectable in upper and lower  respiratory specimens dur ing the acute phase of infection.  Positive  results are indicative of active infection with SARS-CoV-2.  Clinical  correlation with patient history and other diagnostic information is  necessary to determine patient infection status.  Positive results do  not rule out bacterial infection or co-infection with other viruses. If result is PRESUMPTIVE POSTIVE SARS-CoV-2 nucleic acids MAY BE PRESENT.   A presumptive positive result was obtained on the submitted specimen  and confirmed on repeat testing.  While 2019 novel coronavirus  (SARS-CoV-2) nucleic acids may be present in the submitted sample  additional confirmatory testing may be necessary for  epidemiological  and / or clinical management purposes  to differentiate between  SARS-CoV-2 and other Sarbecovirus currently known to infect humans.  If clinically indicated additional testing with an alternate test  methodology [phone]) is advised. The SARS-CoV-2 RNA is generally  detectable in upper and lower respiratory sp ecimens during the acute  phase of infection. The expected result is Negative. Fact Sheet for Patients:  StrictlyIdeas.no Fact Sheet for Healthcare Providers: BankingDealers.co.za This test is not yet approved or cleared by the [place] FDA and has been authorized for detection and/or diagnosis of SARS-CoV-2 by FDA under an Emergency Use Authorization (EUA).  This EUA will remain in effect (meaning this test can be used) for the duration of the COVID-19 declaration under Section 564(b)(1) of the Act, 21 U.S.C. section 360bbb-3(b)(1), unless the authorization is terminated or revoked sooner. Performed at [facility], [address] [place], [place] [address]     RADIOLOGY:  No results found.   Management plans discussed with the patient, family and they are in agreement.  CODE STATUS:     Code Status Orders  (From admission, onward)         Start     Ordered   07/24/18 1835  Full code  Continuous     07/24/18 1834        Code Status History    This patient has a current code status but no historical code status.      TOTAL TIME TAKING CARE OF THIS PATIENT: 36 minutes.    Karen Kays M.D on 07/29/2018 at 1:20 PM  Between 7am to 6pm - Pager - [phone]  After 6pm go to [email or web address] - Proofreader  [facility]  Office  [phone]  CC: Primary care physician; McLean-Scocuzza, Nino Glow, MD   Note: This dictation was prepared with Dragon dictation along with smaller phrase technology. Any transcriptional errors that result from this  process are unintentional.

## 2018-07-29 NOTE — TOC Transition Note (Signed)
Transition of Care [facility]) - CM/SW Discharge Note   Patient Details  Name: GAITHER BIEHN MRN: [id number] Date of Birth: 1927/05/30  Transition of [facility]) CM/SW Contact:  Annamaria Boots, [place] Phone Number: 07/29/2018, 2:49 PM   Clinical Narrative:   Patient is medically ready for discharge today. CSW notified patient of discharge today and presented bed offers. Patient chose bed at [facility]. CSW also notified patient's son of discharge as well as his niece. Patient will be transported by EMS. RN to call report and call for transport.     Final next level of care: Skilled Nursing Facility Barriers to Discharge: No Barriers Identified   Patient Goals and CMS Choice Patient states their goals for this hospitalization and ongoing recovery are:: I really want to go home but I may need some help CMS Medicare.gov Compare Post Acute Care list provided to:: Patient Choice offered to / list presented to : Patient  Discharge Placement   Existing PASRR number confirmed : 07/28/18          Patient chooses bed at: [facility] [place] Patient to be transferred to facility by: EMS Name of family member notified: Peggyann Shoals- Niece  Patient and family notified of of transfer: 07/29/18  Discharge Plan and Services     Post Acute Care Choice: [place]                               Social Determinants of Health (SDOH) Interventions     Readmission Risk Interventions No flowsheet data found.

## 2018-07-29 NOTE — Telephone Encounter (Signed)
Pt is being discharged today from the hospital/HFU/Dx Lower Extremity angiography/amputate second toe on right foot/Pt is being discharged today. Pt is scheduled. Thank you!

## 2018-07-29 NOTE — Telephone Encounter (Signed)
Can we schedule a telephone encounter from rehab ??  For clinic only  IF he comes here after discharge from [place] prefer same day before he goes home as his home is infested with bed bugs and not sure if he has had them treated yet   Thanks [place]

## 2018-07-29 NOTE — Telephone Encounter (Signed)
Per notes from hospital patient is being DC to [facility] for Rehab. [place] appointment for 08/02/18 will probably need to be canceled, due to patient in rehab facility. Will follow for TCM once discharged from [facility].

## 2018-07-29 NOTE — Discharge Instructions (Signed)
1.Dressing changed every other day and make sure he utilizes the postop shoe when he gets up to ambulate.   2.Only have bathroom privileges at this point until this heals up more effectively.    3. Okay for him to move from bed to the couch or recliner also.

## 2018-07-29 NOTE — Care Management Important Message (Signed)
Important Message  Patient Details  Name: Lucas Jackson MRN: [id number] Date of Birth: 10-15-1927   Medicare Important Message Given:  Yes    [name] 07/29/2018, 12:41 PM

## 2018-07-30 LAB — AEROBIC/ANAEROBIC CULTURE W GRAM STAIN (SURGICAL/DEEP WOUND)

## 2018-07-31 LAB — AEROBIC/ANAEROBIC CULTURE W GRAM STAIN (SURGICAL/DEEP WOUND)

## 2018-07-31 LAB — AEROBIC/ANAEROBIC CULTURE (SURGICAL/DEEP WOUND)

## 2018-08-01 NOTE — Telephone Encounter (Signed)
Transition Care Management Follow-up Telephone Call  How have you been since you were released from the hospital? [place] and talked with Nurse Marye Round) the rehab center will be transporting patient in for HFU for Toe amputation.  Patient does not have Bed Bugs at this time according to [facility], and has been doing well since discharge.   Do you understand why you were in the hospital? yes   Do you understand the discharge instrcutions? yes  Items Reviewed:  Medications reviewed: yes  Allergies reviewed: yes  Dietary changes reviewed: yes  Referrals reviewed: yes   Functional Questionnaire:   Activities of Daily Living (ADLs):   He states they are independent in the following: Feeding, grooming, continence . States they require assistance with the following: ambulation, bathing and hygiene and dressing   Any transportation issues/concerns?: no   Any patient concerns? Yes, if patient returns home he will develop Bed Bugs if living area has not been fumigated.   Confirmed importance and date/time of follow-up visits scheduled: yes   Confirmed with patient if condition begins to worsen call PCP or go to the ER.  Patient was given the Call-a-Nurse line [phone]: yes

## 2018-08-02 ENCOUNTER — Ambulatory Visit: Payer: Medicare Other | Admitting: Internal Medicine

## 2018-08-02 DIAGNOSIS — I739 Peripheral vascular disease, unspecified: Secondary | ICD-10-CM | POA: Diagnosis not present

## 2018-08-02 DIAGNOSIS — I10 Essential (primary) hypertension: Secondary | ICD-10-CM | POA: Diagnosis not present

## 2018-08-02 DIAGNOSIS — E119 Type 2 diabetes mellitus without complications: Secondary | ICD-10-CM | POA: Diagnosis not present

## 2018-08-02 DIAGNOSIS — M86171 Other acute osteomyelitis, right ankle and foot: Secondary | ICD-10-CM | POA: Diagnosis not present

## 2018-08-08 DIAGNOSIS — I739 Peripheral vascular disease, unspecified: Secondary | ICD-10-CM | POA: Diagnosis not present

## 2018-08-08 DIAGNOSIS — D649 Anemia, unspecified: Secondary | ICD-10-CM | POA: Diagnosis not present

## 2018-08-08 DIAGNOSIS — E119 Type 2 diabetes mellitus without complications: Secondary | ICD-10-CM | POA: Diagnosis not present

## 2018-08-08 DIAGNOSIS — I10 Essential (primary) hypertension: Secondary | ICD-10-CM | POA: Diagnosis not present

## 2018-08-08 DIAGNOSIS — M86171 Other acute osteomyelitis, right ankle and foot: Secondary | ICD-10-CM | POA: Diagnosis not present

## 2018-08-09 ENCOUNTER — Encounter: Payer: Self-pay | Admitting: Internal Medicine

## 2018-08-10 ENCOUNTER — Telehealth: Payer: Self-pay | Admitting: *Deleted

## 2018-08-10 NOTE — Telephone Encounter (Signed)
Called [facility] left message for patient Social worker to return my call Dorothea Ogle) to discuss patient.

## 2018-08-10 NOTE — Telephone Encounter (Signed)
See phone note started.

## 2018-08-11 NOTE — Telephone Encounter (Signed)
Please inform his niece contact in chart   thanks  [place]

## 2018-08-11 NOTE — Telephone Encounter (Signed)
Patient will not be released for at least 2 weeks per Fish farm manager for [employer]. Social worker has called DSS in regards to the complaint of neglect and Bed Bugs in the home but no one has returned the call from [place].  Lucas Jackson will have to address this or the family would have to be responsible for the removal of Bed Bugs.

## 2018-08-11 NOTE — Telephone Encounter (Signed)
[name] from [facility] called back and stated DSS is saying family must resolve Bedbug problem.  Lucas Jackson that PCP would like to do a Telehealth visit day before release home , [name] agreed to call office at [place] and arrange will call me directly.

## 2018-08-11 NOTE — Telephone Encounter (Signed)
Lucas Jackson has been notified.

## 2018-08-14 ENCOUNTER — Other Ambulatory Visit
Admission: RE | Admit: 2018-08-14 | Discharge: 2018-08-14 | Disposition: A | Discharge status: Home / Self Care | Payer: Medicare Other | Source: Ambulatory Visit | Attending: Family Medicine | Admitting: Family Medicine

## 2018-08-14 DIAGNOSIS — Z1159 Encounter for screening for other viral diseases: Secondary | ICD-10-CM | POA: Insufficient documentation

## 2018-08-14 LAB — SARS CORONAVIRUS 2 BY RT PCR (HOSPITAL ORDER, PERFORMED IN ~~LOC~~ HOSPITAL LAB): SARS Coronavirus 2: NEGATIVE

## 2018-08-19 DIAGNOSIS — E119 Type 2 diabetes mellitus without complications: Secondary | ICD-10-CM | POA: Diagnosis not present

## 2018-08-19 DIAGNOSIS — M86171 Other acute osteomyelitis, right ankle and foot: Secondary | ICD-10-CM | POA: Diagnosis not present

## 2018-08-19 DIAGNOSIS — I739 Peripheral vascular disease, unspecified: Secondary | ICD-10-CM | POA: Diagnosis not present

## 2018-08-19 DIAGNOSIS — K5900 Constipation, unspecified: Secondary | ICD-10-CM | POA: Diagnosis not present

## 2018-08-19 DIAGNOSIS — I10 Essential (primary) hypertension: Secondary | ICD-10-CM | POA: Diagnosis not present

## 2018-08-25 DIAGNOSIS — I739 Peripheral vascular disease, unspecified: Secondary | ICD-10-CM | POA: Diagnosis not present

## 2018-08-25 DIAGNOSIS — E119 Type 2 diabetes mellitus without complications: Secondary | ICD-10-CM | POA: Diagnosis not present

## 2018-08-25 DIAGNOSIS — I10 Essential (primary) hypertension: Secondary | ICD-10-CM | POA: Diagnosis not present

## 2018-08-25 DIAGNOSIS — M86171 Other acute osteomyelitis, right ankle and foot: Secondary | ICD-10-CM | POA: Diagnosis not present

## 2018-08-25 DIAGNOSIS — K5900 Constipation, unspecified: Secondary | ICD-10-CM | POA: Diagnosis not present

## 2018-08-27 DIAGNOSIS — D649 Anemia, unspecified: Secondary | ICD-10-CM | POA: Diagnosis not present

## 2018-08-27 DIAGNOSIS — R319 Hematuria, unspecified: Secondary | ICD-10-CM | POA: Diagnosis not present

## 2018-08-27 DIAGNOSIS — N390 Urinary tract infection, site not specified: Secondary | ICD-10-CM | POA: Diagnosis not present

## 2018-08-30 ENCOUNTER — Other Ambulatory Visit (INDEPENDENT_AMBULATORY_CARE_PROVIDER_SITE_OTHER): Payer: Self-pay | Admitting: Vascular Surgery

## 2018-08-30 DIAGNOSIS — Z4781 Encounter for orthopedic aftercare following surgical amputation: Secondary | ICD-10-CM | POA: Diagnosis not present

## 2018-08-30 DIAGNOSIS — M6281 Muscle weakness (generalized): Secondary | ICD-10-CM | POA: Diagnosis not present

## 2018-08-30 DIAGNOSIS — S91309A Unspecified open wound, unspecified foot, initial encounter: Secondary | ICD-10-CM | POA: Diagnosis not present

## 2018-08-30 DIAGNOSIS — Z9862 Peripheral vascular angioplasty status: Secondary | ICD-10-CM

## 2018-08-30 DIAGNOSIS — M869 Osteomyelitis, unspecified: Secondary | ICD-10-CM | POA: Diagnosis not present

## 2018-09-02 ENCOUNTER — Other Ambulatory Visit: Payer: Self-pay

## 2018-09-02 ENCOUNTER — Ambulatory Visit (INDEPENDENT_AMBULATORY_CARE_PROVIDER_SITE_OTHER): Payer: Medicare Other | Admitting: Nurse Practitioner

## 2018-09-02 ENCOUNTER — Ambulatory Visit (INDEPENDENT_AMBULATORY_CARE_PROVIDER_SITE_OTHER): Payer: Medicare Other

## 2018-09-02 ENCOUNTER — Encounter (INDEPENDENT_AMBULATORY_CARE_PROVIDER_SITE_OTHER): Payer: Self-pay | Admitting: Nurse Practitioner

## 2018-09-02 VITALS — BP 152/78 | HR 66 | Resp 16 | Wt 179.0 lb

## 2018-09-02 DIAGNOSIS — E119 Type 2 diabetes mellitus without complications: Secondary | ICD-10-CM

## 2018-09-02 DIAGNOSIS — Z87891 Personal history of nicotine dependence: Secondary | ICD-10-CM

## 2018-09-02 DIAGNOSIS — I739 Peripheral vascular disease, unspecified: Secondary | ICD-10-CM

## 2018-09-02 DIAGNOSIS — Z9862 Peripheral vascular angioplasty status: Secondary | ICD-10-CM

## 2018-09-02 DIAGNOSIS — I10 Essential (primary) hypertension: Secondary | ICD-10-CM | POA: Diagnosis not present

## 2018-09-02 DIAGNOSIS — Z79899 Other long term (current) drug therapy: Secondary | ICD-10-CM | POA: Diagnosis not present

## 2018-09-02 DIAGNOSIS — E1151 Type 2 diabetes mellitus with diabetic peripheral angiopathy without gangrene: Secondary | ICD-10-CM | POA: Diagnosis not present

## 2018-09-02 DIAGNOSIS — Z7984 Long term (current) use of oral hypoglycemic drugs: Secondary | ICD-10-CM

## 2018-09-05 DIAGNOSIS — Z89421 Acquired absence of other right toe(s): Secondary | ICD-10-CM | POA: Diagnosis not present

## 2018-09-06 DIAGNOSIS — I10 Essential (primary) hypertension: Secondary | ICD-10-CM | POA: Diagnosis not present

## 2018-09-06 DIAGNOSIS — I739 Peripheral vascular disease, unspecified: Secondary | ICD-10-CM | POA: Diagnosis not present

## 2018-09-06 DIAGNOSIS — K5900 Constipation, unspecified: Secondary | ICD-10-CM | POA: Diagnosis not present

## 2018-09-06 DIAGNOSIS — E119 Type 2 diabetes mellitus without complications: Secondary | ICD-10-CM | POA: Diagnosis not present

## 2018-09-06 DIAGNOSIS — M86171 Other acute osteomyelitis, right ankle and foot: Secondary | ICD-10-CM | POA: Diagnosis not present

## 2018-09-09 DIAGNOSIS — D519 Vitamin B12 deficiency anemia, unspecified: Secondary | ICD-10-CM | POA: Diagnosis not present

## 2018-09-09 DIAGNOSIS — D649 Anemia, unspecified: Secondary | ICD-10-CM | POA: Diagnosis not present

## 2018-09-09 DIAGNOSIS — I10 Essential (primary) hypertension: Secondary | ICD-10-CM | POA: Diagnosis not present

## 2018-09-10 ENCOUNTER — Other Ambulatory Visit
Admission: RE | Admit: 2018-09-10 | Discharge: 2018-09-10 | Disposition: A | Discharge status: Home / Self Care | Payer: Medicare Other | Source: Ambulatory Visit | Attending: Family Medicine | Admitting: Family Medicine

## 2018-09-10 DIAGNOSIS — Z1159 Encounter for screening for other viral diseases: Secondary | ICD-10-CM | POA: Insufficient documentation

## 2018-09-11 ENCOUNTER — Encounter (INDEPENDENT_AMBULATORY_CARE_PROVIDER_SITE_OTHER): Payer: Self-pay | Admitting: Nurse Practitioner

## 2018-09-11 DIAGNOSIS — I739 Peripheral vascular disease, unspecified: Secondary | ICD-10-CM | POA: Insufficient documentation

## 2018-09-11 NOTE — Progress Notes (Signed)
SUBJECTIVE:  Patient ID: Lucas Jackson, male    DOB: February 27, 1928, 85 y.o.   MRN: [id number] Chief Complaint  Patient presents with  . Follow-up    [facility] 99month abi    HPI  Lucas Jackson is a 85 y.o. male The patient returns to the office for followup and review status post angiogram with intervention. The patient notes improvement in the lower extremity symptoms. No interval shortening of the patient's claudication distance or rest pain symptoms. Previous wounds have had progressive healing  No new ulcers or wounds have occurred since the last visit.  There have been no significant changes to the patient's overall health care.  The patient denies amaurosis fugax or recent TIA symptoms. There are no recent neurological changes noted. The patient denies history of DVT, PE or superficial thrombophlebitis. The patient denies recent episodes of angina or shortness of breath.   ABI's Rt=0.99 and Lt=1.14  (No previous ABI) Duplex US of the tibial arteries of the bilater lower extremity arterial system shows strong monophasic waveforms bilaterally.    Past Medical History:  Diagnosis Date  . Diabetes mellitus without complication ([place])   . Hyperlipidemia   . Hypertension   . Osteomyelitis [facility])     Past Surgical History:  Procedure Laterality Date  . AMPUTATION TOE Right 07/26/2018   Procedure: AMPUTATION TOE 2ND TOE RIGHT FOOT;  Surgeon: Albertine Patricia, DPM;  Location: [facility] ORS;  Service: Podiatry;  Laterality: Right;  . APPENDECTOMY     1985  . BRAIN SURGERY  1961   removed bone from inside skull that was pressing on optic nerve  . HERNIA REPAIR     umbilical 2683   . LOWER EXTREMITY ANGIOGRAPHY Right 07/27/2018   Procedure: Lower Extremity Angiography possible intervention;  Surgeon: Katha Cabal, MD;  Location: [place] [facility];  Service: Cardiovascular;  Laterality: Right;  . TOE AMPUTATION     right great toe 2016    Social History   Socioeconomic History   . Marital status: Widowed    Spouse name: Not on file  . Number of children: 3  . Years of education: Not on file  . Highest education level: Not on file  Occupational History  . Not on file  Social Needs  . Financial resource strain: Not on file  . Food insecurity:    Worry: Not on file    Inability: Not on file  . Transportation needs:    Medical: Not on file    Non-medical: Not on file  Tobacco Use  . Smoking status: Former Smoker    Last attempt to quit: 1961    Years since quitting: 59.4  . Smokeless tobacco: Never Used  Substance and Sexual Activity  . Alcohol use: No  . Drug use: Never  . Sexual activity: Not Currently  Lifestyle  . Physical activity:    Days per week: Not on file    Minutes per session: Not on file  . Stress: Not on file  Relationships  . Social connections:    Talks on phone: Not on file    Gets together: Not on file    Attends religious service: Not on file    Active member of club or organization: Not on file    Attends meetings of clubs or organizations: Not on file    Relationship status: Not on file  . Intimate partner violence:    Fear of current or ex partner: Not on file    Emotionally  abused: Not on file    Physically abused: Not on file    Forced sexual activity: Not on file  Other Topics Concern  . Not on file  Social History Narrative   3 kids    Niece Peggyann Shoals [phone] involved in care    Owns guns, wears seat belts, safe in relationship   Retired, widower   Some high school ed    Likes car detailing    Lives at home with 2 relatives     Family History  Problem Relation Age of Onset  . Colon cancer Brother   . Cancer Brother        colon   . Stroke Brother   . Heart disease Daughter        MI    Allergies  Allergen Reactions  . 5-Alpha Reductase Inhibitors      Review of Systems   Review of Systems: Negative Unless Checked Constitutional: [] Weight loss  [] Fever  [] Chills Cardiac: [] Chest pain    []  Atrial Fibrillation  [] Palpitations   [] Shortness of breath when laying flat   [] Shortness of breath with exertion. [] Shortness of breath at rest Vascular:  [] Pain in legs with walking   [] Pain in legs with standing [] Pain in legs when laying flat   [] Claudication    [] Pain in feet when laying flat    [] History of DVT   [] Phlebitis   [x] Swelling in legs   [] Varicose veins   [x] Non-healing ulcers Pulmonary:   [] Uses home oxygen   [] Productive cough   [] Hemoptysis   [] Wheeze  [] COPD   [] Asthma Neurologic:  [] Dizziness   [] Seizures  [] Blackouts [x] History of stroke   [] History of TIA  [] Aphasia   [] Temporary Blindness   [] Weakness or numbness in arm   [x] Weakness or numbness in leg Musculoskeletal:   [] Joint swelling   [] Joint pain   [] Low back pain  []  History of Knee Replacement [] Arthritis [] back Surgeries  []  Spinal Stenosis    Hematologic:  [] Easy bruising  [] Easy bleeding   [] Hypercoagulable state   [] Anemic Gastrointestinal:  [] Diarrhea   [] Vomiting  [] Gastroesophageal reflux/heartburn   [] Difficulty swallowing. [] Abdominal pain Genitourinary:  [] Chronic kidney disease   [] Difficult urination  [] Anuric   [] Blood in urine [] Frequent urination  [] Burning with urination   [] Hematuria Skin:  [] Rashes   [] Ulcers [] Wounds Psychological:  [] History of anxiety   []  History of major depression  [x]  Memory Difficulties      OBJECTIVE:   Physical Exam  BP (!) 152/78 (BP Location: Right Arm)   Pulse 66   Resp 16   Wt 179 lb (81.2 kg)   BMI 24.97 kg/m   Gen: WD/WN, NAD Head: [place]/AT, No temporalis wasting.  Ear/Nose/Throat: Hearing grossly intact, nares w/o erythema or drainage Eyes: PER, EOMI, sclera nonicteric.  Neck: Supple, no masses.  No JVD.  Pulmonary:  Good air movement, no use of accessory muscles.  Cardiac: RRR Vascular: right great toe amputation n healing stages  Vessel Right Left  Radial Palpable Palpable  Dorsalis Pedis Not Palpable Not Palpable  Posterior Tibial Not Palpable  Not Palpable   Gastrointestinal: soft, non-distended. No guarding/no peritoneal signs.  Musculoskeletal: Wheelchair bound.  No deformity or atrophy.  Neurologic: Pain and light touch intact in extremities.  Symmetrical.  Speech is fluent. Motor exam as listed above. Psychiatric: Judgment intact, Mood & affect appropriate for pt's clinical situation. Dermatologic: No Venous rashes. No Ulcers Noted.  No changes consistent with cellulitis. Lymph : No  Cervical lymphadenopathy, no lichenification or skin changes of chronic lymphedema.       ASSESSMENT AND PLAN:  1. PAD (peripheral artery disease) (HCC) Recommend:  The patient is status post successful angiogram with intervention.  The patient reports that the claudication symptoms and leg pain is essentially gone.   The patient denies lifestyle limiting changes at this point in time.  No further invasive studies, angiography or surgery at this time The patient should continue walking and begin a more formal exercise program.  The patient should continue antiplatelet therapy and aggressive treatment of the lipid abnormalities  Smoking cessation was again discussed  The patient should continue wearing graduated compression socks 10-15 mmHg strength to control the mild edema.  Patient should undergo noninvasive studies as ordered. The patient will follow up with me after the studies.   - VAS [place] ABI WITH/WO TBI; Future - VAS [place] LOWER EXTREMITY ARTERIAL DUPLEX; Future  2. Essential hypertension Continue antihypertensive medications as already ordered, these medications have been reviewed and there are no changes at this time.   3. Type 2 diabetes mellitus without complication, without long-term current use of insulin (HCC) Continue hypoglycemic medications as already ordered, these medications have been reviewed and there are no changes at this time.  Hgb A1C to be monitored as already arranged by primary service   Current Outpatient  Medications on File Prior to Visit  Medication Sig Dispense Refill  . acetaminophen (TYLENOL) 500 MG tablet Take 1,000 mg by mouth 2 (two) times daily.    Marland Kitchen amLODipine (NORVASC) 2.5 MG tablet Take 1 tablet (2.5 mg total) by mouth daily. 90 tablet 3  . amoxicillin-clavulanate (AUGMENTIN) 875-125 MG tablet Take 1 tablet by mouth 2 (two) times daily.    Marland Kitchen aspirin EC 81 MG tablet Take 1 tablet (81 mg total) by mouth daily. 90 tablet 1  . carvedilol (COREG) 25 MG tablet Take 1 tablet (25 mg total) by mouth 2 (two) times daily. 180 tablet 3  . chlorthalidone (HYGROTON) 25 MG tablet Take 1 tablet (25 mg total) by mouth daily. 90 tablet 3  . diclofenac sodium (VOLTAREN) 1 % GEL Apply 2 g topically 4 (four) times daily. B/l knees. Can use up to 4 grams 100 g 12  . magnesium hydroxide (MILK OF MAGNESIA) 400 MG/5ML suspension Take 30 mLs by mouth daily as needed for mild constipation.    . metFORMIN (GLUCOPHAGE-XR) 500 MG 24 hr tablet Take 1 tablet (500 mg total) by mouth daily. With food 90 tablet 3  . Multiple Vitamin (MULTIVITAMIN WITH MINERALS) TABS tablet Take 1 tablet by mouth daily.    . Omega-3 Fatty Acids (FISH OIL) 1000 MG CAPS Take 1 capsule (1,000 mg total) by mouth 2 (two) times daily. 180 capsule 3  . sennosides-docusate sodium (SENOKOT-S) 8.6-50 MG tablet Take 1 tablet by mouth 2 (two) times a day.    . telmisartan (MICARDIS) 80 MG tablet Take 1 tablet (80 mg total) by mouth daily. 90 tablet 3  . traMADol (ULTRAM) 50 MG tablet Take 1 tablet (50 mg total) by mouth 2 (two) times daily as needed. 10 tablet 0  . tuberculin (APLISOL) 5 UNIT/0.1ML injection Inject into the skin.    Marland Kitchen telmisartan (MICARDIS) 80 MG tablet      No current facility-administered medications on file prior to visit.     There are no Patient Instructions on file for this visit. Return in about 3 months (around 12/03/2018) for PAD.   Kris Hartmann,  NP  This note was completed with Sales executive.  Any errors are  purely unintentional.

## 2018-09-12 LAB — NOVEL CORONAVIRUS, NAA (HOSP ORDER, SEND-OUT TO REF LAB; TAT 18-24 HRS): SARS-CoV-2, NAA: NOT DETECTED

## 2018-09-24 DIAGNOSIS — K5900 Constipation, unspecified: Secondary | ICD-10-CM | POA: Diagnosis not present

## 2018-09-24 DIAGNOSIS — M86171 Other acute osteomyelitis, right ankle and foot: Secondary | ICD-10-CM | POA: Diagnosis not present

## 2018-09-24 DIAGNOSIS — I739 Peripheral vascular disease, unspecified: Secondary | ICD-10-CM | POA: Diagnosis not present

## 2018-09-24 DIAGNOSIS — I10 Essential (primary) hypertension: Secondary | ICD-10-CM | POA: Diagnosis not present

## 2018-09-24 DIAGNOSIS — E119 Type 2 diabetes mellitus without complications: Secondary | ICD-10-CM | POA: Diagnosis not present

## 2018-09-26 DIAGNOSIS — Z89421 Acquired absence of other right toe(s): Secondary | ICD-10-CM | POA: Diagnosis not present

## 2018-09-26 DIAGNOSIS — L97511 Non-pressure chronic ulcer of other part of right foot limited to breakdown of skin: Secondary | ICD-10-CM | POA: Diagnosis not present

## 2018-09-27 DIAGNOSIS — M6281 Muscle weakness (generalized): Secondary | ICD-10-CM | POA: Diagnosis not present

## 2018-09-27 DIAGNOSIS — I10 Essential (primary) hypertension: Secondary | ICD-10-CM | POA: Diagnosis not present

## 2018-09-27 DIAGNOSIS — E785 Hyperlipidemia, unspecified: Secondary | ICD-10-CM | POA: Diagnosis not present

## 2018-09-30 DIAGNOSIS — M869 Osteomyelitis, unspecified: Secondary | ICD-10-CM | POA: Diagnosis not present

## 2018-09-30 DIAGNOSIS — S91309A Unspecified open wound, unspecified foot, initial encounter: Secondary | ICD-10-CM | POA: Diagnosis not present

## 2018-09-30 DIAGNOSIS — M6281 Muscle weakness (generalized): Secondary | ICD-10-CM | POA: Diagnosis not present

## 2018-09-30 DIAGNOSIS — Z4781 Encounter for orthopedic aftercare following surgical amputation: Secondary | ICD-10-CM | POA: Diagnosis not present

## 2018-10-11 ENCOUNTER — Ambulatory Visit (INDEPENDENT_AMBULATORY_CARE_PROVIDER_SITE_OTHER): Payer: Medicare Other | Admitting: Internal Medicine

## 2018-10-11 ENCOUNTER — Other Ambulatory Visit: Payer: Self-pay

## 2018-10-11 ENCOUNTER — Encounter: Payer: Self-pay | Admitting: Internal Medicine

## 2018-10-11 VITALS — BP 126/78 | HR 74 | Temp 97.2°F

## 2018-10-11 DIAGNOSIS — E119 Type 2 diabetes mellitus without complications: Secondary | ICD-10-CM

## 2018-10-11 DIAGNOSIS — I10 Essential (primary) hypertension: Secondary | ICD-10-CM | POA: Diagnosis not present

## 2018-10-11 DIAGNOSIS — D649 Anemia, unspecified: Secondary | ICD-10-CM

## 2018-10-11 DIAGNOSIS — M869 Osteomyelitis, unspecified: Secondary | ICD-10-CM

## 2018-10-11 DIAGNOSIS — E8809 Other disorders of plasma-protein metabolism, not elsewhere classified: Secondary | ICD-10-CM

## 2018-10-11 DIAGNOSIS — F439 Reaction to severe stress, unspecified: Secondary | ICD-10-CM

## 2018-10-11 MED ORDER — METFORMIN HCL 500 MG PO TABS: 500.0000 mg | ORAL_TABLET | Freq: Every day | ORAL | 3 refills | Status: AC
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
## 2018-10-11 NOTE — Patient Instructions (Addendum)
Please see medication update  Stop metformin XR and given regular metformin 500 mg 1x per day   Make Dr. Elvina Mattes podiatry appt due 10/26/18 after right toe amputation   Try to encourage oral intake   Please do fasting labs as in the note CMET, CBC, anemia panel, A1C, lipid, TSH, UA/urine complete fasting by 10/28/2018 and fax back to me [phone] Dr. Olivia Mackie McLean-Scocuzza

## 2018-10-12 ENCOUNTER — Telehealth: Payer: Self-pay

## 2018-10-12 NOTE — Telephone Encounter (Signed)
Copied from [place] [id number]. Topic: General - Other >> Oct 11, 2018  5:53 PM Yvette Rack wrote: Reason for CRM: Pt niece Max Fickle returned call to Dr. Olivia Mackie.Vicky requests call back. Cb# [phone]

## 2018-10-12 NOTE — Telephone Encounter (Signed)
Called niece to disc pt 10/12/2018

## 2018-10-28 DIAGNOSIS — E785 Hyperlipidemia, unspecified: Secondary | ICD-10-CM | POA: Diagnosis not present

## 2018-10-28 DIAGNOSIS — I10 Essential (primary) hypertension: Secondary | ICD-10-CM | POA: Diagnosis not present

## 2018-10-28 DIAGNOSIS — D649 Anemia, unspecified: Secondary | ICD-10-CM | POA: Diagnosis not present

## 2018-10-28 DIAGNOSIS — R319 Hematuria, unspecified: Secondary | ICD-10-CM | POA: Diagnosis not present

## 2018-10-28 DIAGNOSIS — D519 Vitamin B12 deficiency anemia, unspecified: Secondary | ICD-10-CM | POA: Diagnosis not present

## 2018-10-28 DIAGNOSIS — E119 Type 2 diabetes mellitus without complications: Secondary | ICD-10-CM | POA: Diagnosis not present

## 2018-10-28 DIAGNOSIS — E039 Hypothyroidism, unspecified: Secondary | ICD-10-CM | POA: Diagnosis not present

## 2018-10-30 DIAGNOSIS — M6281 Muscle weakness (generalized): Secondary | ICD-10-CM | POA: Diagnosis not present

## 2018-10-30 DIAGNOSIS — M869 Osteomyelitis, unspecified: Secondary | ICD-10-CM | POA: Diagnosis not present

## 2018-10-30 DIAGNOSIS — S91309A Unspecified open wound, unspecified foot, initial encounter: Secondary | ICD-10-CM | POA: Diagnosis not present

## 2018-10-30 DIAGNOSIS — Z4781 Encounter for orthopedic aftercare following surgical amputation: Secondary | ICD-10-CM | POA: Diagnosis not present

## 2018-11-02 DIAGNOSIS — I10 Essential (primary) hypertension: Secondary | ICD-10-CM | POA: Diagnosis not present

## 2018-11-02 DIAGNOSIS — E119 Type 2 diabetes mellitus without complications: Secondary | ICD-10-CM | POA: Diagnosis not present

## 2018-11-02 DIAGNOSIS — I739 Peripheral vascular disease, unspecified: Secondary | ICD-10-CM | POA: Diagnosis not present

## 2018-11-02 DIAGNOSIS — M86171 Other acute osteomyelitis, right ankle and foot: Secondary | ICD-10-CM | POA: Diagnosis not present

## 2018-11-02 DIAGNOSIS — K5900 Constipation, unspecified: Secondary | ICD-10-CM | POA: Diagnosis not present

## 2018-11-08 DIAGNOSIS — E1140 Type 2 diabetes mellitus with diabetic neuropathy, unspecified: Secondary | ICD-10-CM | POA: Diagnosis not present

## 2018-11-08 DIAGNOSIS — M6281 Muscle weakness (generalized): Secondary | ICD-10-CM | POA: Diagnosis not present

## 2018-11-08 DIAGNOSIS — Z4781 Encounter for orthopedic aftercare following surgical amputation: Secondary | ICD-10-CM | POA: Diagnosis not present

## 2018-11-08 DIAGNOSIS — E569 Vitamin deficiency, unspecified: Secondary | ICD-10-CM | POA: Diagnosis not present

## 2018-11-08 DIAGNOSIS — I739 Peripheral vascular disease, unspecified: Secondary | ICD-10-CM | POA: Diagnosis not present

## 2018-11-08 DIAGNOSIS — R52 Pain, unspecified: Secondary | ICD-10-CM | POA: Diagnosis not present

## 2018-11-08 DIAGNOSIS — I10 Essential (primary) hypertension: Secondary | ICD-10-CM | POA: Diagnosis not present

## 2018-11-08 DIAGNOSIS — E785 Hyperlipidemia, unspecified: Secondary | ICD-10-CM | POA: Diagnosis not present

## 2018-11-08 DIAGNOSIS — R2681 Unsteadiness on feet: Secondary | ICD-10-CM | POA: Diagnosis not present

## 2018-11-14 ENCOUNTER — Encounter: Payer: Self-pay | Admitting: Internal Medicine

## 2018-11-14 ENCOUNTER — Telehealth: Payer: Self-pay | Admitting: Internal Medicine

## 2018-11-14 NOTE — Telephone Encounter (Signed)
My chart message  Lucas Jackson is a patient at [facility]. He asked me to contact Dr. Olivia Mackie. He says that he stays cold and the employees at [employer] are having trouble getting blood out of his fingers for his sugar tests. They are also having trouble getting blood out of his arms and he is experiencing indigestion. He would like to talk to Dr. Olivia Mackie   Schedule appt 11/15/2018 with me telephone or doxy  Thanks [place]

## 2018-11-15 ENCOUNTER — Other Ambulatory Visit: Payer: Self-pay

## 2018-11-15 ENCOUNTER — Ambulatory Visit (INDEPENDENT_AMBULATORY_CARE_PROVIDER_SITE_OTHER): Payer: Medicare Other | Admitting: Internal Medicine

## 2018-11-15 DIAGNOSIS — R1310 Dysphagia, unspecified: Secondary | ICD-10-CM | POA: Diagnosis not present

## 2018-11-15 DIAGNOSIS — R142 Eructation: Secondary | ICD-10-CM

## 2018-11-15 DIAGNOSIS — R9389 Abnormal findings on diagnostic imaging of other specified body structures: Secondary | ICD-10-CM | POA: Diagnosis not present

## 2018-11-15 DIAGNOSIS — M869 Osteomyelitis, unspecified: Secondary | ICD-10-CM | POA: Diagnosis not present

## 2018-11-15 DIAGNOSIS — D72819 Decreased white blood cell count, unspecified: Secondary | ICD-10-CM | POA: Diagnosis not present

## 2018-11-15 DIAGNOSIS — K219 Gastro-esophageal reflux disease without esophagitis: Secondary | ICD-10-CM

## 2018-11-15 MED ORDER — PANTOPRAZOLE SODIUM 40 MG PO TBEC: 40.0000 mg | DELAYED_RELEASE_TABLET | Freq: Every day | ORAL | 3 refills | Status: AC

## 2018-11-15 NOTE — Telephone Encounter (Signed)
Appointment has been scheduled today at 1500

## 2018-11-15 NOTE — Progress Notes (Signed)
Virtual Visit via Video Note  I connected with Lucas Jackson   on 11/15/18 at  3:15 PM EDT by a video enabled telemedicine application and verified that I am speaking with the correct person using two identifiers.  Location patient: home Location provider:work or home office Persons participating in the virtual visit: patient, provider, nurse nicole [facility] fax # [phone]  I discussed the limitations of evaluation and management by telemedicine and the availability of in person appointments. The patient expressed understanding and agreed to proceed.   HPI: Since last visit pt c/o [facility] has trouble getting blood from him tried 5 x on right side of body and 4x per left side of body and he denies chills but reports he is "cold" all the time and needs a blanket. Also they are having trouble getting blood daily having to check his blood sugar and his hands are cold   C/o heartburn/indigestion/increased belching and hurts to belch and trouble swallowing all foods they have tried Mylanta which offers temporary relief but sx's return   Right foot osteomyelitis per RN area of healing he had f/u with podiatry w/in the last 1 month but fired them and does not return. They are putting a bandaid on the area to ensure if heals for now pt declines Xray/MRI right foot to r/o internal infection and reports his foot looks healed      ROS: See pertinent positives and negatives per HPI.  Past Medical History:  Diagnosis Date  . Diabetes mellitus without complication ([place])   . Hyperlipidemia   . Hypertension   . Osteomyelitis [facility])     Past Surgical History:  Procedure Laterality Date  . AMPUTATION TOE Right 07/26/2018   Procedure: AMPUTATION TOE 2ND TOE RIGHT FOOT;  Surgeon: Albertine Patricia, DPM;  Location: [facility];  Service: Podiatry;  Laterality: Right;  . APPENDECTOMY     1985  . BRAIN SURGERY  1961   removed bone from inside skull that was pressing on optic nerve  . HERNIA REPAIR      umbilical 1962   . LOWER EXTREMITY ANGIOGRAPHY Right 07/27/2018   Procedure: Lower Extremity Angiography possible intervention;  Surgeon: Katha Cabal, MD;  Location: [facility];  Service: Cardiovascular;  Laterality: Right;  . TOE AMPUTATION     right great toe 2016    Family History  Problem Relation Age of Onset  . Colon cancer Brother   . Cancer Brother        colon   . Stroke Brother   . Heart disease Daughter        MI    SOCIAL HX: living at [place] for now    Current Outpatient Medications:  .  acetaminophen (TYLENOL) 500 MG tablet, Take 1,000 mg by mouth 2 (two) times daily., Disp: , Rfl:  .  amLODipine (NORVASC) 2.5 MG tablet, Take 1 tablet (2.5 mg total) by mouth daily., Disp: 90 tablet, Rfl: 3 .  aspirin EC 81 MG tablet, Take 1 tablet (81 mg total) by mouth daily., Disp: 90 tablet, Rfl: 1 .  carvedilol (COREG) 25 MG tablet, Take 1 tablet (25 mg total) by mouth 2 (two) times daily., Disp: 180 tablet, Rfl: 3 .  chlorthalidone (HYGROTON) 25 MG tablet, Take 1 tablet (25 mg total) by mouth daily. (Patient taking differently: Take 12.5 mg by mouth daily. ), Disp: 90 tablet, Rfl: 3 .  diclofenac sodium (VOLTAREN) 1 % GEL, Apply 2 g topically 4 (four) times daily.  B/l knees. Can use up to 4 grams, Disp: 100 g, Rfl: 12 .  ferrous sulfate 325 (65 FE) MG tablet, Take 325 mg by mouth daily with breakfast., Disp: , Rfl:  .  magnesium hydroxide (MILK OF MAGNESIA) 400 MG/5ML suspension, Take 30 mLs by mouth daily as needed for mild constipation., Disp: , Rfl:  .  Menthol, Topical Analgesic, (BIOFREEZE EX), Apply topically. Shoulders, Disp: , Rfl:  .  metFORMIN (GLUCOPHAGE) 500 MG tablet, Take 1 tablet (500 mg total) by mouth daily with breakfast., Disp: 90 tablet, Rfl: 3 .  Multiple Vitamin (MULTIVITAMIN WITH MINERALS) TABS tablet, Take 1 tablet by mouth daily., Disp: , Rfl:  .  Omega-3 Fatty Acids (FISH OIL) 1000 MG CAPS, Take 1 capsule (1,000 mg total) by mouth 2 (two)  times daily., Disp: 180 capsule, Rfl: 3 .  Omega-3 Fatty Acids (FISH OIL) 1000 MG CAPS, Take 1,000 mg by mouth 2 (two) times a day., Disp: , Rfl:  .  pantoprazole (PROTONIX) 40 MG tablet, Take 1 tablet (40 mg total) by mouth daily. 30 min before breakfast, Disp: 90 tablet, Rfl: 3 .  sennosides-docusate sodium (SENOKOT-S) 8.6-50 MG tablet, Take 1 tablet by mouth 2 (two) times a day., Disp: , Rfl:  .  telmisartan (MICARDIS) 80 MG tablet, Take 1 tablet (80 mg total) by mouth daily., Disp: 90 tablet, Rfl: 3 .  traMADol (ULTRAM) 50 MG tablet, Take 1 tablet (50 mg total) by mouth 2 (two) times daily as needed. (Patient taking differently: Take 50 mg by mouth 2 (two) times daily as needed. Bid prn and has qd tramadol scheduled), Disp: 10 tablet, Rfl: 0 .  tuberculin (APLISOL) 5 UNIT/0.1ML injection, Inject into the skin., Disp: , Rfl:   EXAM:  VITALS per patient if applicable:  GENERAL: alert, oriented, appears well and in no acute distress  HEENT: atraumatic, conjunttiva clear, no obvious abnormalities on inspection of external nose and ears  NECK: normal movements of the head and neck  LUNGS: on inspection no signs of respiratory distress, breathing rate appears normal, no obvious gross SOB, gasping or wheezing  CV: no obvious cyanosis 2+ radial pulses b/l wrists per nurse at [place] today   MS: moves all visible extremities without noticeable abnormality  PSYCH/NEURO: pleasant and cooperative, no obvious depression or anxiety, speech and thought processing grossly intact  ASSESSMENT AND PLAN:  Discussed the following assessment and plan:  Gastroesophageal reflux disease, belching - Plan: pantoprazole (PROTONIX) 40 MG tablet, DG ESOPHAGUS W SINGLE CM (SOL OR THIN BA), Ambulatory referral to Gastroenterology [facility] in [place] to consider EGD with dysphagia sx's   Dysphagia, unspecified type - Plan: DG ESOPHAGUS W SINGLE CM (SOL OR THIN BA), Ambulatory referral to  Gastroenterology See above   Leukopenia, unspecified type - Plan: DG Chest 2 View, Urinalysis, Routine w reflex microscopic, Urine Culture, Culture, blood (single), Culture, blood (single) -[facility] done [facility]  -consider Xray vs MRI right foot to ensure infection is healed OM right foot in future pt declines for now   Abnormal CXR - Plan: DG Chest 2 View  Osteomyelitis of right foot, unspecified type ([place]) - Plan: Culture, blood (single), Culture, blood (single) aerobic and anaerobic 2 different sites   HM Flu shotutd Tdap had 05/07/14 pna 23utd prevnar had 05/07/14  Zoster had 05/07/14 -consider shingrix in future   Never had colonoscopy  Out of age window PSA Echo had  Labs 10/28/18 at [facility] BUN 20.4 slightly high normal 20 Glucose 90  Cr 0.97 K 3.8  lfts normal 24/15 WBC 3.5 H/H 11.4/35.5 platelets 214  TSH 1.19 normal  A1C 6.2  Tc 164, TGs 156, HDL 36, LDL 97  Urine normal but turbid  B12 312 Iron panel normal  Folic acid 22.4     I discussed the assessment and treatment plan with the patient. The patient was provided an opportunity to ask questions and all were answered. The patient agreed with the plan and demonstrated an understanding of the instructions.   The patient was advised to call back or seek an in-person evaluation if the symptoms worsen or if the condition fails to improve as anticipated.  Time spent 25 minutes  Delorise Jackson, MD

## 2018-11-16 DIAGNOSIS — N390 Urinary tract infection, site not specified: Secondary | ICD-10-CM | POA: Diagnosis not present

## 2018-11-16 DIAGNOSIS — I10 Essential (primary) hypertension: Secondary | ICD-10-CM | POA: Diagnosis not present

## 2018-11-16 DIAGNOSIS — R319 Hematuria, unspecified: Secondary | ICD-10-CM | POA: Diagnosis not present

## 2018-11-16 DIAGNOSIS — Z79899 Other long term (current) drug therapy: Secondary | ICD-10-CM | POA: Diagnosis not present

## 2018-11-21 ENCOUNTER — Telehealth: Payer: Self-pay | Admitting: Internal Medicine

## 2018-11-21 DIAGNOSIS — N3000 Acute cystitis without hematuria: Secondary | ICD-10-CM

## 2018-11-21 MED ORDER — SULFAMETHOXAZOLE-TRIMETHOPRIM 800-160 MG PO TABS: 1.0000 | ORAL_TABLET | Freq: Two times a day (BID) | ORAL | 0 refills | Status: AC

## 2018-11-21 NOTE — Telephone Encounter (Signed)
Orders called nto [facility] as advised and nurse to fax over blood cultures. FYI

## 2018-11-21 NOTE — Telephone Encounter (Addendum)
Call [facility] re labs 11/16/18  Blood and urine culture results   Urine culture + klebsiella pneumonia  UTI -will treat with bactrim DS 800-160 2x per day x 1 week with food #14 no refills  -verbal order to repeat urine in 1 week after antibiotics are complete   Are his blood cultures back and resulted ?   Reviewed 11/16/18 blood culture skin gram + cocci likely skin flora/contaminant strep viridans otherwise no growth    TMS

## 2018-11-25 DIAGNOSIS — E119 Type 2 diabetes mellitus without complications: Secondary | ICD-10-CM | POA: Diagnosis not present

## 2018-11-25 DIAGNOSIS — H35013 Changes in retinal vascular appearance, bilateral: Secondary | ICD-10-CM | POA: Diagnosis not present

## 2018-11-25 DIAGNOSIS — H47211 Primary optic atrophy, right eye: Secondary | ICD-10-CM | POA: Diagnosis not present

## 2018-11-25 DIAGNOSIS — H04123 Dry eye syndrome of bilateral lacrimal glands: Secondary | ICD-10-CM | POA: Diagnosis not present

## 2018-11-28 DIAGNOSIS — H524 Presbyopia: Secondary | ICD-10-CM | POA: Diagnosis not present

## 2018-11-29 DIAGNOSIS — R319 Hematuria, unspecified: Secondary | ICD-10-CM | POA: Diagnosis not present

## 2018-11-29 DIAGNOSIS — N390 Urinary tract infection, site not specified: Secondary | ICD-10-CM | POA: Diagnosis not present

## 2018-11-30 DIAGNOSIS — M869 Osteomyelitis, unspecified: Secondary | ICD-10-CM | POA: Diagnosis not present

## 2018-11-30 DIAGNOSIS — Z4781 Encounter for orthopedic aftercare following surgical amputation: Secondary | ICD-10-CM | POA: Diagnosis not present

## 2018-11-30 DIAGNOSIS — M6281 Muscle weakness (generalized): Secondary | ICD-10-CM | POA: Diagnosis not present

## 2018-11-30 DIAGNOSIS — S91309A Unspecified open wound, unspecified foot, initial encounter: Secondary | ICD-10-CM | POA: Diagnosis not present

## 2018-12-01 DIAGNOSIS — E1040 Type 1 diabetes mellitus with diabetic neuropathy, unspecified: Secondary | ICD-10-CM | POA: Diagnosis not present

## 2018-12-01 DIAGNOSIS — R600 Localized edema: Secondary | ICD-10-CM | POA: Diagnosis not present

## 2018-12-01 DIAGNOSIS — B351 Tinea unguium: Secondary | ICD-10-CM | POA: Diagnosis not present

## 2018-12-01 DIAGNOSIS — I739 Peripheral vascular disease, unspecified: Secondary | ICD-10-CM | POA: Diagnosis not present

## 2018-12-01 DIAGNOSIS — L853 Xerosis cutis: Secondary | ICD-10-CM | POA: Diagnosis not present

## 2018-12-02 DIAGNOSIS — N390 Urinary tract infection, site not specified: Secondary | ICD-10-CM | POA: Diagnosis not present

## 2018-12-02 DIAGNOSIS — R319 Hematuria, unspecified: Secondary | ICD-10-CM | POA: Diagnosis not present

## 2018-12-06 ENCOUNTER — Ambulatory Visit (INDEPENDENT_AMBULATORY_CARE_PROVIDER_SITE_OTHER): Payer: Medicare Other

## 2018-12-06 ENCOUNTER — Other Ambulatory Visit: Payer: Self-pay

## 2018-12-06 ENCOUNTER — Encounter (INDEPENDENT_AMBULATORY_CARE_PROVIDER_SITE_OTHER): Payer: Self-pay | Admitting: Vascular Surgery

## 2018-12-06 ENCOUNTER — Ambulatory Visit (INDEPENDENT_AMBULATORY_CARE_PROVIDER_SITE_OTHER): Payer: Medicare Other | Admitting: Vascular Surgery

## 2018-12-06 VITALS — BP 115/71 | HR 60 | Resp 16

## 2018-12-06 DIAGNOSIS — E119 Type 2 diabetes mellitus without complications: Secondary | ICD-10-CM | POA: Diagnosis not present

## 2018-12-06 DIAGNOSIS — E785 Hyperlipidemia, unspecified: Secondary | ICD-10-CM | POA: Diagnosis not present

## 2018-12-06 DIAGNOSIS — I10 Essential (primary) hypertension: Secondary | ICD-10-CM | POA: Diagnosis not present

## 2018-12-06 DIAGNOSIS — I739 Peripheral vascular disease, unspecified: Secondary | ICD-10-CM

## 2018-12-06 NOTE — Assessment & Plan Note (Signed)
blood pressure control important in reducing the progression of atherosclerotic disease. On appropriate oral medications.  

## 2018-12-06 NOTE — Assessment & Plan Note (Signed)
ABIs today are 1.2 bilaterally with some duplex findings of tibial disease and popliteal disease that is mild to moderate.  Flow is overall pretty good.  No role for intervention at this level.  Recheck in 6 months.

## 2018-12-06 NOTE — Assessment & Plan Note (Addendum)
lipid control important in reducing the progression of atherosclerotic disease.   

## 2018-12-06 NOTE — Assessment & Plan Note (Signed)
blood glucose control important in reducing the progression of atherosclerotic disease. Also, involved in wound healing. On appropriate medications.  

## 2018-12-06 NOTE — Progress Notes (Signed)
MRN : [id number]  Lucas Jackson is a 85 y.o. (1927-06-01) male who presents with chief complaint of  Chief Complaint  Patient presents with  . Follow-up    ultrasound follow up  .  History of Present Illness: Patient returns today in follow up of his PAD.  He is undergone previous intervention for PAD with ulceration.  Ulcers have healed.  Biggest complaint now is of arthritis.  No rest pain.  No ulceration.  He really does not walk enough to claudicate.  ABIs today are 1.2 bilaterally with some duplex findings of tibial disease and popliteal disease that is mild to moderate.  Current Outpatient Medications  Medication Sig Dispense Refill  . acetaminophen (TYLENOL) 500 MG tablet Take 1,000 mg by mouth 2 (two) times daily.    Marland Kitchen amLODipine (NORVASC) 2.5 MG tablet Take 1 tablet (2.5 mg total) by mouth daily. 90 tablet 3  . aspirin EC 81 MG tablet Take 1 tablet (81 mg total) by mouth daily. 90 tablet 1  . carvedilol (COREG) 25 MG tablet Take 1 tablet (25 mg total) by mouth 2 (two) times daily. 180 tablet 3  . chlorthalidone (HYGROTON) 25 MG tablet Take 1 tablet (25 mg total) by mouth daily. (Patient taking differently: Take 12.5 mg by mouth daily. ) 90 tablet 3  . ciprofloxacin (CIPRO) 250 MG tablet     . diclofenac sodium (VOLTAREN) 1 % GEL Apply 2 g topically 4 (four) times daily. B/l knees. Can use up to 4 grams 100 g 12  . ferrous sulfate 325 (65 FE) MG tablet Take 325 mg by mouth daily with breakfast.    . magnesium hydroxide (MILK OF MAGNESIA) 400 MG/5ML suspension Take 30 mLs by mouth daily as needed for mild constipation.    . Menthol, Topical Analgesic, (BIOFREEZE EX) Apply topically. Shoulders    . metFORMIN (GLUCOPHAGE) 500 MG tablet Take 1 tablet (500 mg total) by mouth daily with breakfast. 90 tablet 3  . Multiple Vitamin (MULTIVITAMIN WITH MINERALS) TABS tablet Take 1 tablet by mouth daily.    . Omega-3 Fatty Acids (FISH OIL) 1000 MG CAPS Take 1 capsule (1,000 mg total) by  mouth 2 (two) times daily. 180 capsule 3  . Omega-3 Fatty Acids (FISH OIL) 1000 MG CAPS Take 1,000 mg by mouth 2 (two) times a day.    . pantoprazole (PROTONIX) 40 MG tablet Take 1 tablet (40 mg total) by mouth daily. 30 min before breakfast 90 tablet 3  . sennosides-docusate sodium (SENOKOT-S) 8.6-50 MG tablet Take 1 tablet by mouth 2 (two) times a day.    . telmisartan (MICARDIS) 80 MG tablet Take 1 tablet (80 mg total) by mouth daily. 90 tablet 3  . traMADol (ULTRAM) 50 MG tablet Take 1 tablet (50 mg total) by mouth 2 (two) times daily as needed. (Patient taking differently: Take 50 mg by mouth 2 (two) times daily as needed. Bid prn and has qd tramadol scheduled) 10 tablet 0  . sulfamethoxazole-trimethoprim (BACTRIM DS) 800-160 MG tablet Take 1 tablet by mouth 2 (two) times daily. With food (Patient not taking: Reported on 12/06/2018) 14 tablet 0  . tuberculin (APLISOL) 5 UNIT/0.1ML injection Inject into the skin.     No current facility-administered medications for this visit.     Past Medical History:  Diagnosis Date  . Diabetes mellitus without complication ([place])   . Hyperlipidemia   . Hypertension   . Osteomyelitis [facility])     Past Surgical History:  Procedure  Laterality Date  . AMPUTATION TOE Right 07/26/2018   Procedure: AMPUTATION TOE 2ND TOE RIGHT FOOT;  Surgeon: Albertine Patricia, DPM;  Location: [facility];  Service: Podiatry;  Laterality: Right;  . APPENDECTOMY     1985  . BRAIN SURGERY  1961   removed bone from inside skull that was pressing on optic nerve  . HERNIA REPAIR     umbilical [id number]   . LOWER EXTREMITY ANGIOGRAPHY Right 07/27/2018   Procedure: Lower Extremity Angiography possible intervention;  Surgeon: Katha Cabal, MD;  Location: [facility];  Service: Cardiovascular;  Laterality: Right;  . TOE AMPUTATION     right great toe 2016    Social History Social History   Tobacco Use  . Smoking status: Former Smoker    Quit date: 1961    Years since  quitting: 59.7  . Smokeless tobacco: Never Used  Substance Use Topics  . Alcohol use: No  . Drug use: Never    Family History Family History  Problem Relation Age of Onset  . Colon cancer Brother   . Cancer Brother        colon   . Stroke Brother   . Heart disease Daughter        MI    Allergies  Allergen Reactions  . 5-Alpha Reductase Inhibitors      REVIEW OF SYSTEMS (Negative unless checked)  Constitutional: [] Weight loss  [] Fever  [] Chills Cardiac: [] Chest pain   [] Chest pressure   [] Palpitations   [] Shortness of breath when laying flat   [] Shortness of breath at rest   [] Shortness of breath with exertion. Vascular:  [] Pain in legs with walking   [] Pain in legs at rest   [] Pain in legs when laying flat   [] Claudication   [] Pain in feet when walking  [] Pain in feet at rest  [] Pain in feet when laying flat   [] History of DVT   [] Phlebitis   [] Swelling in legs   [] Varicose veins   [] Non-healing ulcers Pulmonary:   [] Uses home oxygen   [] Productive cough   [] Hemoptysis   [] Wheeze  [] COPD   [] Asthma Neurologic:  [] Dizziness  [] Blackouts   [] Seizures   [] History of stroke   [] History of TIA  [] Aphasia   [] Temporary blindness   [] Dysphagia   [] Weakness or numbness in arms   [] Weakness or numbness in legs Musculoskeletal:  [x] Arthritis   [] Joint swelling   [x] Joint pain   [] Low back pain Hematologic:  [] Easy bruising  [] Easy bleeding   [] Hypercoagulable state   [] Anemic   Gastrointestinal:  [] Blood in stool   [] Vomiting blood  [] Gastroesophageal reflux/heartburn   [] Abdominal pain Genitourinary:  [x] Chronic kidney disease   [] Difficult urination  [] Frequent urination  [] Burning with urination   [] Hematuria Skin:  [] Rashes   [] Ulcers   [] Wounds Psychological:  [] History of anxiety   []  History of major depression.  Physical Examination  BP 115/71 (BP Location: Right Arm)   Pulse 60   Resp 16  Gen:  WD/WN, NAD Head: [place]/AT, No temporalis wasting. Ear/Nose/Throat: Hearing grossly  intact, nares w/o erythema or drainage Eyes: Conjunctiva clear. Sclera non-icteric Neck: Supple.  Trachea midline Pulmonary:  Good air movement, no use of accessory muscles.  Cardiac: irregular Vascular:  Vessel Right Left  Radial Palpable Palpable                          PT 1+ Palpable 2+ Palpable  DP 1+ Palpable Not  Palpable    Musculoskeletal: M/S 5/5 throughout.  No deformity or atrophy. Trace LE edema. In a wheelchair Neurologic: Sensation grossly intact in extremities.  Symmetrical.  Speech is fluent.  Psychiatric: Judgment intact, Mood & affect appropriate for pt's clinical situation. Dermatologic: No rashes or ulcers noted.  No cellulitis or open wounds.       Labs Recent Results (from the past 2160 hour(s))  Novel Coronavirus, NAA (hospital order; send-out to ref lab)     Status: None   Collection Time: 09/10/18 10:30 AM  Result Value Ref Range   SARS-CoV-2, NAA NOT DETECTED NOT DETECTED    Comment: (NOTE) Testing was performed using the cobas(R) SARS-CoV-2 test. This test was developed and its performance characteristics determined by [employer]. This test has not been FDA cleared or approved. This test has been authorized by FDA under an Emergency Use Authorization (EUA). This test is only authorized for the duration of time the declaration that circumstances exist justifying the authorization of the emergency use of in vitro diagnostic tests for detection of SARS-CoV-2 virus and/or diagnosis of COVID-19 infection under section 564(b)(1) of the Act, 21 U.S.C. [id number]), unless the authorization is terminated or revoked sooner. When diagnostic testing is negative, the possibility of a false negative result should be considered in the context of a patient's recent exposures and the presence of clinical signs and symptoms consistent with COVID-19. An individual without symptoms of COVID-19 and who is not shedding SARS-CoV-2 virus would expect  to have  a negative (not detected) result in this assay. Performed At: [facility] [address] [place], [place] [id number] Rush Farmer MD [id number]    Coronavirus Source NASOPHARYNGEAL     Comment: Performed at [facility], [address], [place], [place] [address]    Radiology No results found.  Assessment/Plan  HTN (hypertension) blood pressure control important in reducing the progression of atherosclerotic disease. On appropriate oral medications.   Type 2 diabetes mellitus without complications (HCC) blood glucose control important in reducing the progression of atherosclerotic disease. Also, involved in wound healing. On appropriate medications.   HLD (hyperlipidemia) lipid control important in reducing the progression of atherosclerotic disease.   PAD (peripheral artery disease) (HCC) ABIs today are 1.2 bilaterally with some duplex findings of tibial disease and popliteal disease that is mild to moderate.  Flow is overall pretty good.  No role for intervention at this level.  Recheck in 6 months.    Leotis Pain, MD  12/06/2018 3:11 PM    This note was created with Dragon medical transcription system.  Any errors from dictation are purely unintentional

## 2018-12-07 ENCOUNTER — Telehealth: Payer: Self-pay | Admitting: *Deleted

## 2018-12-07 ENCOUNTER — Ambulatory Visit
Admission: RE | Admit: 2018-12-07 | Discharge: 2018-12-07 | Disposition: A | Discharge status: Home / Self Care | Payer: Medicare Other | Source: Ambulatory Visit | Attending: Internal Medicine | Admitting: Internal Medicine

## 2018-12-07 ENCOUNTER — Other Ambulatory Visit: Payer: Self-pay | Admitting: Internal Medicine

## 2018-12-07 DIAGNOSIS — K219 Gastro-esophageal reflux disease without esophagitis: Secondary | ICD-10-CM | POA: Diagnosis not present

## 2018-12-07 DIAGNOSIS — R1310 Dysphagia, unspecified: Secondary | ICD-10-CM

## 2018-12-07 DIAGNOSIS — K228 Other specified diseases of esophagus: Secondary | ICD-10-CM

## 2018-12-07 DIAGNOSIS — K2289 Other specified disease of esophagus: Secondary | ICD-10-CM

## 2018-12-07 DIAGNOSIS — K224 Dyskinesia of esophagus: Secondary | ICD-10-CM | POA: Diagnosis not present

## 2018-12-07 NOTE — Telephone Encounter (Signed)
Copied from [place] [id number]. Topic: General - Call Back - No Documentation >> Dec 07, 2018 10:52 AM Erick Blinks wrote: Reason for CRM: Olivia Mackie from [facility] requesting call back from nurse to deliver Barium Swallow results. Please advise  [phone]

## 2018-12-07 NOTE — Telephone Encounter (Signed)
Notified radiology that PCP has already and responded to DG of esophagus.

## 2018-12-08 ENCOUNTER — Other Ambulatory Visit: Payer: Self-pay

## 2018-12-08 ENCOUNTER — Ambulatory Visit (INDEPENDENT_AMBULATORY_CARE_PROVIDER_SITE_OTHER): Payer: Medicare Other | Admitting: Gastroenterology

## 2018-12-08 ENCOUNTER — Encounter: Payer: Self-pay | Admitting: Gastroenterology

## 2018-12-08 VITALS — BP 103/57 | HR 63 | Temp 98.0°F

## 2018-12-08 DIAGNOSIS — R933 Abnormal findings on diagnostic imaging of other parts of digestive tract: Secondary | ICD-10-CM

## 2018-12-08 DIAGNOSIS — D509 Iron deficiency anemia, unspecified: Secondary | ICD-10-CM

## 2018-12-08 NOTE — Progress Notes (Signed)
Jonathon Bellows MD, MRCP(U.K) [address]  [place]  [place], [place] [address]  Main: [phone]  Fax: [phone]   Gastroenterology Consultation  Referring Provider:     McLean-Scocuzza, Olivia Mackie * Primary Care Physician:  McLean-Scocuzza, Nino Glow, MD Primary Gastroenterologist:  Dr. Jonathon Bellows  Reason for Consultation:     Abnormal results of the barium swallow.        HPI:   Lucas Jackson is a 85 y.o. y/o male referred for consultation & management  by Dr. Terese Door, Nino Glow, MD.    [facility] resides [facility] and has had difficulty swallowing all foods.  He underwent a barium swallow yesterday on 12/07/2018 and he demonstrated an irregular masslike filling defect within the distal esophagus with suggestion of central ulceration.  Lesion measures at least 4 cm in length and results in the long segment fixed stricture.  Findings are highly suspicious for esophageal carcinoma.   10/28/2018: Hemoglobin11.4 g with an MCV of 78.7.  He mentions that he has been having difficulty swallowing for the past 3 to 4 weeks.  Mostly for solids.  He denies use of any blood thinners.  Denies any weight loss.  Denies any heartburn.  Past Medical History:  Diagnosis Date   Diabetes mellitus without complication ([place])    Hyperlipidemia    Hypertension    Osteomyelitis ([place])     Past Surgical History:  Procedure Laterality Date   AMPUTATION TOE Right 07/26/2018   Procedure: AMPUTATION TOE 2ND TOE RIGHT FOOT;  Surgeon: Albertine Patricia, DPM;  Location: [facility];  Service: Podiatry;  Laterality: Right;   [place]   removed bone from inside skull that was pressing on optic nerve   HERNIA REPAIR     umbilical [id number]    LOWER EXTREMITY ANGIOGRAPHY Right 07/27/2018   Procedure: Lower Extremity Angiography possible intervention;  Surgeon: Katha Cabal, MD;  Location: [facility];  Service: Cardiovascular;  Laterality:  Right;   TOE AMPUTATION     right great toe 2016    Prior to Admission medications   Medication Sig Start Date End Date Taking? Authorizing Provider  acetaminophen (TYLENOL) 500 MG tablet Take 1,000 mg by mouth 2 (two) times daily.    [provider]  amLODipine (NORVASC) 2.5 MG tablet Take 1 tablet (2.5 mg total) by mouth daily. 06/09/18   McLean-Scocuzza, Nino Glow, MD  aspirin EC 81 MG tablet Take 1 tablet (81 mg total) by mouth daily. 10/28/17   McLean-Scocuzza, Nino Glow, MD  carvedilol (COREG) 25 MG tablet Take 1 tablet (25 mg total) by mouth 2 (two) times daily. 02/10/18   McLean-Scocuzza, Nino Glow, MD  chlorthalidone (HYGROTON) 25 MG tablet Take 1 tablet (25 mg total) by mouth daily. Patient taking differently: Take 12.5 mg by mouth daily.  02/10/18   McLean-Scocuzza, Nino Glow, MD  ciprofloxacin (CIPRO) 250 MG tablet  11/19/18   [provider]  diclofenac sodium (VOLTAREN) 1 % GEL Apply 2 g topically 4 (four) times daily. B/l knees. Can use up to 4 grams 03/09/18   McLean-Scocuzza, Nino Glow, MD  ferrous sulfate 325 (65 FE) MG tablet Take 325 mg by mouth daily with breakfast.    [provider]  magnesium hydroxide (MILK OF MAGNESIA) 400 MG/5ML suspension Take 30 mLs by mouth daily as needed for mild constipation.    [provider]  Menthol, Topical Analgesic, (BIOFREEZE EX) Apply  topically. Shoulders    [provider]  metFORMIN (GLUCOPHAGE) 500 MG tablet Take 1 tablet (500 mg total) by mouth daily with breakfast. 10/11/18   McLean-Scocuzza, Nino Glow, MD  Multiple Vitamin (MULTIVITAMIN WITH MINERALS) TABS tablet Take 1 tablet by mouth daily.    [provider]  Omega-3 Fatty Acids (FISH OIL) 1000 MG CAPS Take 1 capsule (1,000 mg total) by mouth 2 (two) times daily. 02/10/18   McLean-Scocuzza, Nino Glow, MD  Omega-3 Fatty Acids (FISH OIL) 1000 MG CAPS Take 1,000 mg by mouth 2 (two) times a day.    [provider]  pantoprazole (PROTONIX) 40  MG tablet Take 1 tablet (40 mg total) by mouth daily. 30 min before breakfast 11/15/18   McLean-Scocuzza, Nino Glow, MD  sennosides-docusate sodium (SENOKOT-S) 8.6-50 MG tablet Take 1 tablet by mouth 2 (two) times a day.    [provider]  sulfamethoxazole-trimethoprim (BACTRIM DS) 800-160 MG tablet Take 1 tablet by mouth 2 (two) times daily. With food Patient not taking: Reported on 12/06/2018 11/21/18   McLean-Scocuzza, Nino Glow, MD  telmisartan (MICARDIS) 80 MG tablet Take 1 tablet (80 mg total) by mouth daily. 02/10/18   McLean-Scocuzza, Nino Glow, MD  traMADol (ULTRAM) 50 MG tablet Take 1 tablet (50 mg total) by mouth 2 (two) times daily as needed. Patient taking differently: Take 50 mg by mouth 2 (two) times daily as needed. Bid prn and has qd tramadol scheduled 10/28/17   McLean-Scocuzza, Nino Glow, MD  tuberculin (APLISOL) 5 UNIT/0.1ML injection Inject into the skin.    [provider]    Family History  Problem Relation Age of Onset   Colon cancer Brother    Cancer Brother        colon    Stroke Brother    Heart disease Daughter        MI     Social History   Tobacco Use   Smoking status: Former Smoker    Quit date: 1961    Years since quitting: 59.7   Smokeless tobacco: Never Used  Substance Use Topics   Alcohol use: No   Drug use: Never    Allergies as of 12/08/2018 - Review Complete 12/06/2018  Allergen Reaction Noted   5-alpha reductase inhibitors  06/09/2018    Review of Systems:    All systems reviewed and negative except where noted in HPI.   Physical Exam:  There were no vitals taken for this visit. No LMP for male patient. Psych:  Alert and cooperative. Normal mood and affect.  Sits in a wheelchair General:   Alert,  Well-developed, well-nourished, pleasant and cooperative in NAD Head:  Normocephalic and atraumatic. Eyes:  Sclera clear, no icterus.   Conjunctiva pink. Ears:  Normal auditory acuity. Nose:  No deformity, discharge, or  lesions. Mouth:  No deformity or lesions,oropharynx pink & moist. Neck:  Supple; no masses or thyromegaly. Lungs:  Respirations even and unlabored.  Clear throughout to auscultation.   No wheezes, crackles, or rhonchi. No acute distress. Heart:  Regular rate and rhythm; no murmurs, clicks, rubs, or gallops. Abdomen:  Normal bowel sounds.  No bruits.  Soft, non-tender and non-distended without masses, hepatosplenomegaly or hernias noted.  No guarding or rebound tenderness.    Neurologic:  Alert and oriented x3;   Skin:  Intact without significant lesions or rashes. No jaundice. Lymph Nodes:  No significant cervical adenopathy. Psych:  Alert and cooperative. Normal mood and affect.  Imaging Studies: Vas [place] Abi With/wo  Tbi  Result Date: 12/06/2018 LOWER EXTREMITY DOPPLER STUDY Indications: Peripheral artery disease. High Risk Factors: Diabetes.  Vascular Interventions: 07/26/2018 right PTA                         Right great toe removed 2016, small toe 2020. Comparison Study: 08/07/2018 Performing Technologist: Concha Norway RVT  Examination Guidelines: A complete evaluation includes at minimum, Doppler waveform signals and systolic blood pressure reading at the level of bilateral brachial, anterior tibial, and posterior tibial arteries, when vessel segments are accessible. Bilateral testing is considered an integral part of a complete examination. Photoelectric Plethysmograph (PPG) waveforms and toe systolic pressure readings are included as required and additional duplex testing as needed. Limited examinations for reoccurring indications may be performed as noted.  ABI Findings: +---------+------------------+-----+--------+----------------------+  Right     Rt Pressure (mmHg) Index Waveform Comment                 +---------+------------------+-----+--------+----------------------+  Brachial  114                                                        +---------+------------------+-----+--------+----------------------+  ATA       126                1.10  biphasic                         +---------+------------------+-----+--------+----------------------+  PTA       142                1.23  biphasic                         +---------+------------------+-----+--------+----------------------+  Great Toe                                   amputated               +---------+------------------+-----+--------+----------------------+  2nd toe                            normal   too small for pressure  +---------+------------------+-----+--------+----------------------+ +---------+------------------+-----+--------+-------+  Left      Lt Pressure (mmHg) Index Waveform Comment  +---------+------------------+-----+--------+-------+  Brachial  115                                        +---------+------------------+-----+--------+-------+  ATA                                absent            +---------+------------------+-----+--------+-------+  PTA       142                1.23  biphasic          +---------+------------------+-----+--------+-------+  Great Toe 97                 0.84  Normal            +---------+------------------+-----+--------+-------+ +-------+-----------+-----------+------------+------------+  ABI/TBI Today's ABI Today's TBI Previous ABI Previous TBI  +-------+-----------+-----------+------------+------------+  Right   1.23                    .99                        +-------+-----------+-----------+------------+------------+  Left    1.23        .84         1.14                       +-------+-----------+-----------+------------+------------+ Bilateral ABIs appear essentially unchanged compared to prior study on 09/02/2018.  Summary: Right: Resting right ankle-brachial index is within normal range. No evidence of significant right lower extremity arterial disease. Left: Resting left ankle-brachial index is within normal range. No evidence of  significant left lower extremity arterial disease. The left toe-brachial index is normal.  *See table(s) above for measurements and observations.  Electronically signed by Leotis Pain MD on 12/06/2018 at 3:45:35 PM.    Final    Vas [place] Lower Extremity Arterial Duplex  Result Date: 12/06/2018 LOWER EXTREMITY ARTERIAL DUPLEX STUDY Indications: Peripheral artery disease.  Vascular Interventions: 08/04/2018 rt pta. Current ABI:            rt = 1.23 ; lt = 1.23 Performing Technologist: Concha Norway RVT  Examination Guidelines: A complete evaluation includes B-mode imaging, spectral Doppler, color Doppler, and power Doppler as needed of all accessible portions of each vessel. Bilateral testing is considered an integral part of a complete examination. Limited examinations for reoccurring indications may be performed as noted.  +----------+--------+-----+--------+----------+--------+  RIGHT      PSV cm/s Ratio Stenosis Waveform   Comments  +----------+--------+-----+--------+----------+--------+  CFA Mid    125                     triphasic            +----------+--------+-----+--------+----------+--------+  DFA        36                      biphasic             +----------+--------+-----+--------+----------+--------+  SFA Prox   69                      biphasic             +----------+--------+-----+--------+----------+--------+  SFA Mid    68                      biphasic             +----------+--------+-----+--------+----------+--------+  SFA Distal 65                      triphasic            +----------+--------+-----+--------+----------+--------+  POP Distal 52                      biphasic             +----------+--------+-----+--------+----------+--------+  ATA Distal 35                      monophasic           +----------+--------+-----+--------+----------+--------+  PTA Distal 62  biphasic             +----------+--------+-----+--------+----------+--------+   +----------+--------+-----+--------+---------+--------+  LEFT       PSV cm/s Ratio Stenosis Waveform  Comments  +----------+--------+-----+--------+---------+--------+  CFA Mid    130                     triphasic           +----------+--------+-----+--------+---------+--------+  DFA        52                      biphasic            +----------+--------+-----+--------+---------+--------+  SFA Prox   58                      triphasic           +----------+--------+-----+--------+---------+--------+  SFA Mid    55                      triphasic           +----------+--------+-----+--------+---------+--------+  SFA Distal 49                      biphasic            +----------+--------+-----+--------+---------+--------+  POP Distal 76                      biphasic            +----------+--------+-----+--------+---------+--------+  ATA Distal                occluded                     +----------+--------+-----+--------+---------+--------+  PTA Distal 82                      biphasic            +----------+--------+-----+--------+---------+--------+  Summary: Right: Mild atherosclerosis to the popliteal artery. Moderate calf vessel atherosclerosis. Left: Mild atherosclerosis to the popliteal artery. Moderate calf vessel atherosclerosis. Distal ATA occluded.  See table(s) above for measurements and observations. Electronically signed by Leotis Pain MD on 12/06/2018 at 3:45:25 PM.    Final    Dg Esophagus W Single Cm (sol Or Thin Ba)  Result Date: 12/07/2018 CLINICAL DATA:  Dysphagia EXAM: ESOPHOGRAM/BARIUM SWALLOW TECHNIQUE: Single contrast examination was performed using thin barium or water soluble. FLUOROSCOPY TIME:  Fluoroscopy Time:  54 seconds Radiation Exposure Index (if provided by the fluoroscopic device): 30.2 mGy Number of Acquired Spot Images: 16 full exposures COMPARISON:  None. FINDINGS: Within the distal esophagus approximately 2.5 cm proximal to the gastroesophageal junction is an eccentric area of  fixed, irregular luminal narrowing of the esophageal wall. This large eccentric lesion with lobulated margins measures approximately 4 cm in length with suggestion of a central ulceration. There is proximal dilation of the esophagus upstream of this lesion. Delayed passage of the barium column with extensive non propulsive tertiary contractions. Contrast was able to traverse the area of luminal narrowing and pass into the stomach. IMPRESSION: 1. Irregular masslike filling defect within the distal esophagus with suggestion of a central ulceration. Lesion measures at least 4 cm in length and results in a long segment fixed stricture. Findings are highly suspicious for esophageal carcinoma and further evaluation with endoscopy and tissue sampling is recommended. 2. Esophageal  dysmotility. These results will be called to the ordering clinician or representative by the Radiologist Assistant, and communication documented in the PACS or zVision Dashboard. Electronically Signed   By: Davina Poke M.D.   On: 12/07/2018 10:36    Assessment and Plan:   DRAXLER SEVERSON is a 85 y.o. y/o male has been referred for for dysphagia and an abnormal esophagogram which shows a distal esophageal masslike lesion.  He also has a microcytic anemia with a hemoglobin of 11.4  Plan 1.  Urgent EGD early next week. 2.  Check iron studies, B12, folate, repeat hemoglobin   I have discussed alternative options, risks & benefits,  which include, but are not limited to, bleeding, infection, perforation,respiratory complication & drug reaction.  The patient agrees with this plan & written consent will be obtained.     Follow up in 2 weeks   Dr Jonathon Bellows MD,MRCP(U.K)

## 2018-12-09 LAB — CBC WITH DIFFERENTIAL/PLATELET
Basophils Absolute: 0 10*3/uL (ref 0.0–0.2)
Basos: 0 %
EOS (ABSOLUTE): 0 10*3/uL (ref 0.0–0.4)
Eos: 1 %
Hematocrit: 20.7 % — ABNORMAL LOW (ref 37.5–51.0)
Hemoglobin: 7 g/dL — CL (ref 13.0–17.7)
Immature Grans (Abs): 0 10*3/uL (ref 0.0–0.1)
Immature Granulocytes: 0 %
Lymphocytes Absolute: 0.7 10*3/uL (ref 0.7–3.1)
Lymphs: 22 %
MCH: 24.4 pg — ABNORMAL LOW (ref 26.6–33.0)
MCHC: 33.8 g/dL (ref 31.5–35.7)
MCV: 72 fL — ABNORMAL LOW (ref 79–97)
Monocytes Absolute: 0.3 10*3/uL (ref 0.1–0.9)
Monocytes: 7 %
Neutrophils Absolute: 2.3 10*3/uL (ref 1.4–7.0)
Neutrophils: 70 %
Platelets: 75 10*3/uL — CL (ref 150–450)
RBC: 2.87 x10E6/uL — ABNORMAL LOW (ref 4.14–5.80)
RDW: 14.8 % (ref 11.6–15.4)
WBC: 3.3 10*3/uL — ABNORMAL LOW (ref 3.4–10.8)

## 2018-12-09 LAB — IRON,TIBC AND FERRITIN PANEL
Ferritin: 116 ng/mL (ref 30–400)
Iron Saturation: 10 % — ABNORMAL LOW (ref 15–55)
Iron: 30 ug/dL — ABNORMAL LOW (ref 38–169)
Total Iron Binding Capacity: 293 ug/dL (ref 250–450)
UIBC: 263 ug/dL (ref 111–343)

## 2018-12-09 LAB — B12 AND FOLATE PANEL
Folate: 12.2 ng/mL (ref >3.0)
Vitamin B-12: 273 pg/mL (ref 232–1245)

## 2018-12-14 ENCOUNTER — Other Ambulatory Visit: Payer: Self-pay

## 2018-12-14 DIAGNOSIS — D509 Iron deficiency anemia, unspecified: Secondary | ICD-10-CM

## 2018-12-15 ENCOUNTER — Inpatient Hospital Stay: Payer: Medicare Other | Attending: Oncology | Admitting: Oncology

## 2018-12-15 ENCOUNTER — Ambulatory Visit: Payer: Medicare Other | Admitting: Gastroenterology

## 2018-12-15 DIAGNOSIS — D563 Thalassemia minor: Secondary | ICD-10-CM | POA: Insufficient documentation

## 2018-12-15 DIAGNOSIS — D649 Anemia, unspecified: Secondary | ICD-10-CM | POA: Diagnosis not present

## 2018-12-15 DIAGNOSIS — C155 Malignant neoplasm of lower third of esophagus: Secondary | ICD-10-CM | POA: Insufficient documentation

## 2018-12-15 DIAGNOSIS — D509 Iron deficiency anemia, unspecified: Secondary | ICD-10-CM | POA: Insufficient documentation

## 2018-12-15 DIAGNOSIS — D519 Vitamin B12 deficiency anemia, unspecified: Secondary | ICD-10-CM | POA: Diagnosis not present

## 2018-12-16 ENCOUNTER — Encounter: Payer: Self-pay | Admitting: *Deleted

## 2018-12-19 ENCOUNTER — Encounter: Admission: RE | Payer: Self-pay | Source: Home / Self Care

## 2018-12-19 ENCOUNTER — Encounter: Payer: Self-pay | Admitting: Anesthesiology

## 2018-12-19 ENCOUNTER — Ambulatory Visit: Admission: RE | Admit: 2018-12-19 | Payer: Medicare Other | Source: Home / Self Care | Admitting: Gastroenterology

## 2018-12-19 DIAGNOSIS — Z1159 Encounter for screening for other viral diseases: Secondary | ICD-10-CM | POA: Diagnosis not present

## 2018-12-19 SURGERY — ESOPHAGOGASTRODUODENOSCOPY (EGD) WITH PROPOFOL
Anesthesia: General

## 2018-12-22 ENCOUNTER — Other Ambulatory Visit: Payer: Medicare Other

## 2018-12-22 ENCOUNTER — Inpatient Hospital Stay: Payer: Medicare Other | Attending: Oncology | Admitting: Oncology

## 2018-12-22 ENCOUNTER — Inpatient Hospital Stay: Payer: Medicare Other

## 2018-12-22 ENCOUNTER — Encounter: Payer: Self-pay | Admitting: Internal Medicine

## 2018-12-22 ENCOUNTER — Telehealth: Payer: Self-pay

## 2018-12-22 ENCOUNTER — Encounter: Payer: Self-pay | Admitting: Oncology

## 2018-12-22 ENCOUNTER — Other Ambulatory Visit: Payer: Self-pay

## 2018-12-22 ENCOUNTER — Encounter: Payer: Self-pay | Admitting: Family Medicine

## 2018-12-22 VITALS — BP 125/65 | HR 62 | Temp 98.0°F | Resp 18

## 2018-12-22 DIAGNOSIS — R1310 Dysphagia, unspecified: Secondary | ICD-10-CM

## 2018-12-22 DIAGNOSIS — D696 Thrombocytopenia, unspecified: Secondary | ICD-10-CM

## 2018-12-22 DIAGNOSIS — D509 Iron deficiency anemia, unspecified: Secondary | ICD-10-CM | POA: Diagnosis not present

## 2018-12-22 DIAGNOSIS — R933 Abnormal findings on diagnostic imaging of other parts of digestive tract: Secondary | ICD-10-CM

## 2018-12-22 LAB — RETIC PANEL
Immature Retic Fract: 8.6 % (ref 2.3–15.9)
RBC.: 4.48 MIL/uL (ref 4.22–5.81)
Retic Count, Absolute: 24.6 10*3/uL (ref 19.0–186.0)
Retic Ct Pct: 0.6 % (ref 0.4–3.1)
Reticulocyte Hemoglobin: 29.7 pg (ref >27.9)

## 2018-12-22 LAB — TSH: TSH: 0.927 u[IU]/mL (ref 0.350–4.500)

## 2018-12-22 LAB — COMPREHENSIVE METABOLIC PANEL
ALT: 15 U/L (ref 0–44)
AST: 23 U/L (ref 15–41)
Albumin: 3.3 g/dL — ABNORMAL LOW (ref 3.5–5.0)
Alkaline Phosphatase: 70 U/L (ref 38–126)
Anion gap: 9 (ref 5–15)
BUN: 23 mg/dL (ref 8–23)
CO2: 26 mmol/L (ref 22–32)
Calcium: 9.2 mg/dL (ref 8.9–10.3)
Chloride: 102 mmol/L (ref 98–111)
Creatinine, Ser: 1.24 mg/dL (ref 0.61–1.24)
GFR calc Af Amer: 59 mL/min — ABNORMAL LOW (ref >60)
GFR calc non Af Amer: 51 mL/min — ABNORMAL LOW (ref >60)
Glucose, Bld: 118 mg/dL — ABNORMAL HIGH (ref 70–99)
Potassium: 4.1 mmol/L (ref 3.5–5.1)
Sodium: 137 mmol/L (ref 135–145)
Total Bilirubin: 0.6 mg/dL (ref 0.3–1.2)
Total Protein: 7.3 g/dL (ref 6.5–8.1)

## 2018-12-22 LAB — CBC WITH DIFFERENTIAL/PLATELET
Abs Immature Granulocytes: 0.01 10*3/uL (ref 0.00–0.07)
Basophils Absolute: 0 10*3/uL (ref 0.0–0.1)
Basophils Relative: 1 %
Eosinophils Absolute: 0.1 10*3/uL (ref 0.0–0.5)
Eosinophils Relative: 1 %
HCT: 33.4 % — ABNORMAL LOW (ref 39.0–52.0)
Hemoglobin: 10.8 g/dL — ABNORMAL LOW (ref 13.0–17.0)
Immature Granulocytes: 0 %
Lymphocytes Relative: 31 %
Lymphs Abs: 1.2 10*3/uL (ref 0.7–4.0)
MCH: 24.1 pg — ABNORMAL LOW (ref 26.0–34.0)
MCHC: 32.3 g/dL (ref 30.0–36.0)
MCV: 74.6 fL — ABNORMAL LOW (ref 80.0–100.0)
Monocytes Absolute: 0.2 10*3/uL (ref 0.1–1.0)
Monocytes Relative: 6 %
Neutro Abs: 2.5 10*3/uL (ref 1.7–7.7)
Neutrophils Relative %: 61 %
Platelets: 228 10*3/uL (ref 150–400)
RBC: 4.48 MIL/uL (ref 4.22–5.81)
RDW: 14.7 % (ref 11.5–15.5)
WBC: 4 10*3/uL (ref 4.0–10.5)
nRBC: 0 % (ref 0.0–0.2)

## 2018-12-22 LAB — IMMATURE PLATELET FRACTION: Immature Platelet Fraction: 2.3 % (ref 1.2–8.6)

## 2018-12-22 NOTE — Telephone Encounter (Signed)
Spoke with pt's Patient care coordinator at [facility] and was able to schedule pt urgent endoscopy procedure on 12-23-18. [facility] has been notified. Updated procedure prep instructions have been faxed to [facility]. I have also informed pt niece, Vickie.

## 2018-12-22 NOTE — Telephone Encounter (Signed)
-----   Message from Jonathon Bellows, MD sent at 12/22/2018  1:07 PM EDT ----- Thank you Dr Arvella Nigh proceed with EGD

## 2018-12-22 NOTE — Progress Notes (Signed)
Patient reports that he does not know why he is here today.  He states he has not had an increase in fatigue or weakness.  Patient refused to obtain weight today due to him not able to safely stand.

## 2018-12-22 NOTE — Progress Notes (Signed)
Thank you Dr Tasia Catchings, Sherald Hess proceed with EGD

## 2018-12-23 ENCOUNTER — Encounter
Admission: RE | Disposition: A | Discharge status: Home / Self Care | Payer: Self-pay | Source: Home / Self Care | Attending: Gastroenterology

## 2018-12-23 ENCOUNTER — Inpatient Hospital Stay: Payer: Medicare Other

## 2018-12-23 ENCOUNTER — Ambulatory Visit: Payer: Medicare Other | Admitting: Anesthesiology

## 2018-12-23 ENCOUNTER — Ambulatory Visit
Admission: RE | Admit: 2018-12-23 | Discharge: 2018-12-23 | Disposition: A | Discharge status: Home / Self Care | Payer: Medicare Other | Attending: Gastroenterology | Admitting: Gastroenterology

## 2018-12-23 ENCOUNTER — Other Ambulatory Visit
Admission: RE | Admit: 2018-12-23 | Discharge: 2018-12-23 | Disposition: A | Discharge status: Home / Self Care | Payer: Medicare Other | Source: Home / Self Care | Attending: Gastroenterology | Admitting: Gastroenterology

## 2018-12-23 DIAGNOSIS — Z87891 Personal history of nicotine dependence: Secondary | ICD-10-CM | POA: Diagnosis not present

## 2018-12-23 DIAGNOSIS — Z888 Allergy status to other drugs, medicaments and biological substances status: Secondary | ICD-10-CM | POA: Diagnosis not present

## 2018-12-23 DIAGNOSIS — Z7982 Long term (current) use of aspirin: Secondary | ICD-10-CM | POA: Diagnosis not present

## 2018-12-23 DIAGNOSIS — R933 Abnormal findings on diagnostic imaging of other parts of digestive tract: Secondary | ICD-10-CM

## 2018-12-23 DIAGNOSIS — Z792 Long term (current) use of antibiotics: Secondary | ICD-10-CM | POA: Insufficient documentation

## 2018-12-23 DIAGNOSIS — D509 Iron deficiency anemia, unspecified: Secondary | ICD-10-CM

## 2018-12-23 DIAGNOSIS — Z79899 Other long term (current) drug therapy: Secondary | ICD-10-CM | POA: Insufficient documentation

## 2018-12-23 DIAGNOSIS — I10 Essential (primary) hypertension: Secondary | ICD-10-CM | POA: Diagnosis not present

## 2018-12-23 DIAGNOSIS — Z7984 Long term (current) use of oral hypoglycemic drugs: Secondary | ICD-10-CM | POA: Diagnosis not present

## 2018-12-23 DIAGNOSIS — C155 Malignant neoplasm of lower third of esophagus: Secondary | ICD-10-CM | POA: Insufficient documentation

## 2018-12-23 DIAGNOSIS — K228 Other specified diseases of esophagus: Secondary | ICD-10-CM | POA: Diagnosis not present

## 2018-12-23 DIAGNOSIS — D490 Neoplasm of unspecified behavior of digestive system: Secondary | ICD-10-CM | POA: Diagnosis not present

## 2018-12-23 DIAGNOSIS — Z20828 Contact with and (suspected) exposure to other viral communicable diseases: Secondary | ICD-10-CM | POA: Insufficient documentation

## 2018-12-23 DIAGNOSIS — E1151 Type 2 diabetes mellitus with diabetic peripheral angiopathy without gangrene: Secondary | ICD-10-CM | POA: Insufficient documentation

## 2018-12-23 DIAGNOSIS — E785 Hyperlipidemia, unspecified: Secondary | ICD-10-CM | POA: Insufficient documentation

## 2018-12-23 DIAGNOSIS — K219 Gastro-esophageal reflux disease without esophagitis: Secondary | ICD-10-CM | POA: Insufficient documentation

## 2018-12-23 HISTORY — PX: ESOPHAGOGASTRODUODENOSCOPY (EGD) WITH PROPOFOL: SHX5813

## 2018-12-23 LAB — HEMOGLOBINOPATHY EVALUATION
Hgb A2 Quant: 4.6 % — ABNORMAL HIGH (ref 1.8–3.2)
Hgb A: 94.8 % — ABNORMAL LOW (ref 96.4–98.8)
Hgb C: 0 %
Hgb F Quant: 0.6 % (ref 0.0–2.0)
Hgb S Quant: 0 %
Hgb Variant: 0 %

## 2018-12-23 LAB — GLUCOSE, CAPILLARY: Glucose-Capillary: 103 mg/dL — ABNORMAL HIGH (ref 70–99)

## 2018-12-23 LAB — SARS CORONAVIRUS 2 BY RT PCR (HOSPITAL ORDER, PERFORMED IN ~~LOC~~ HOSPITAL LAB): SARS Coronavirus 2: NEGATIVE

## 2018-12-23 SURGERY — ESOPHAGOGASTRODUODENOSCOPY (EGD) WITH PROPOFOL
Anesthesia: General

## 2018-12-23 MED ORDER — PROPOFOL 10 MG/ML IV BOLUS
INTRAVENOUS | Status: DC | PRN
  Administered 2018-12-23: 40 mg via INTRAVENOUS
  Administered 2018-12-23: 30 mg via INTRAVENOUS
  Administered 2018-12-23: 80 mg via INTRAVENOUS

## 2018-12-23 MED ORDER — PROPOFOL 10 MG/ML IV BOLUS
INTRAVENOUS | Status: AC
  Filled 2018-12-23: qty 20

## 2018-12-23 MED ORDER — SODIUM CHLORIDE 0.9 % IV SOLN
INTRAVENOUS | Status: DC
  Administered 2018-12-23: 10:00:00 1000 mL via INTRAVENOUS

## 2018-12-23 MED ORDER — LIDOCAINE HCL (CARDIAC) PF 100 MG/5ML IV SOSY
PREFILLED_SYRINGE | INTRAVENOUS | Status: DC | PRN
  Administered 2018-12-23: 50 mg via INTRAVENOUS

## 2018-12-23 NOTE — Transfer of Care (Signed)
Immediate Anesthesia Transfer of Care Note  Patient: Lucas Jackson  Procedure(s) Performed: ESOPHAGOGASTRODUODENOSCOPY (EGD) WITH PROPOFOL (N/A )  Patient Location: Endoscopy Unit  Anesthesia Type:General  Level of Consciousness: drowsy and patient cooperative  Airway & Oxygen Therapy: Patient Spontanous Breathing and Patient connected to nasal cannula oxygen  Post-op Assessment: Report given to RN and Post -op Vital signs reviewed and stable  Post vital signs: Reviewed and stable  Last Vitals:  Vitals Value Taken Time  BP 104/57 12/23/18 1105  Temp 36.2 C 12/23/18 1100  Pulse 66 12/23/18 1107  Resp 19 12/23/18 1107  SpO2 94 % 12/23/18 1107  Vitals shown include unvalidated device data.  Last Pain:  Vitals:   12/23/18 1100  TempSrc: Tympanic  PainSc:          Complications: No apparent anesthesia complications

## 2018-12-23 NOTE — Op Note (Signed)
[facility] Gastroenterology Patient Name: Lucas Jackson Procedure Date: 12/23/2018 9:40 AM MRN: [id number] Account #: [id number] Date of Birth: [birth year] Admit Type: Outpatient Age: 85 Room: [facility] ENDO ROOM 4 Gender: Male Note Status: Finalized Procedure:            Upper GI endoscopy Indications:          Endoscopy to confirm suspected neoplastic lesion of the                        esophagus seen on previous imaging study Providers:            Jonathon Bellows MD, MD Medicines:            Monitored Anesthesia Care Complications:        No immediate complications. Procedure:            Pre-Anesthesia Assessment:                       - Prior to the procedure, a History and Physical was                        performed, and patient medications, allergies and                        sensitivities were reviewed. The patient's tolerance of                        previous anesthesia was reviewed.                       - The risks and benefits of the procedure and the                        sedation options and risks were discussed with the                        patient. All questions were answered and informed                        consent was obtained.                       - ASA Grade Assessment: III - A patient with severe                        systemic disease.                       After obtaining informed consent, the endoscope was                        passed under direct vision. Throughout the procedure,                        the patient's blood pressure, pulse, and oxygen                        saturations were monitored continuously. The Endoscope                        was introduced through the mouth, and advanced to the  third part of duodenum. The Endoscope was introduced                        through the and advanced to the. The upper GI endoscopy                        was accomplished with ease. The patient tolerated the                   procedure well. Findings:      The examined duodenum was normal.      The stomach was normal.      The cardia and gastric fundus were normal on retroflexion.      A large, fungating and ulcerating mass with no bleeding and no stigmata       of recent bleeding was found in the lower third of the esophagus, 30 to       35 cm from the incisors. The mass was partially obstructing and       partially circumferential (involving one-half of the lumen       circumference). This was biopsied with a cold forceps for histology. Ge       junction at 38 cm mark - no large areas of salmon colored mucosa       suspicious for barrettes seen Impression:           - Normal examined duodenum.                       - Normal stomach.                       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                        Biopsied.                       - If esophageal stent needed- it would be possible in                        the future Recommendation:       - Discharge patient to home (with escort).                       - Resume previous diet.                       - Continue present medications.                       - Await pathology results.                       - Return to my office in 1 week. Procedure Code(s):    --- Professional ---                       [phone], Esophagogastroduodenoscopy, flexible, transoral;  with biopsy, single or multiple Diagnosis Code(s):    --- Professional ---                       D49.0, Neoplasm of unspecified behavior of digestive                        system                       R93.3, Abnormal findings on diagnostic imaging of other                        parts of digestive tract CPT copyright 2019 American Medical Association. All rights reserved. The codes documented in this report are preliminary and upon coder review may  be revised to  meet current compliance requirements. Jonathon Bellows, MD Jonathon Bellows MD, MD 12/23/2018 11:00:53 AM This report has been signed electronically. Number of Addenda: 0 Note Initiated On: 12/23/2018 9:40 AM Estimated Blood Loss: Estimated blood loss: none. Estimated blood loss was                        minimal.      [facility]

## 2018-12-23 NOTE — Anesthesia Preprocedure Evaluation (Addendum)
Anesthesia Evaluation  Patient identified by MRN, date of birth, ID band Patient awake    Reviewed: Allergy & Precautions, H&P , NPO status , Patient's Chart, lab work & pertinent test results  Airway Mallampati: II  TM Distance: >3 FB     Dental  (+) Edentulous Lower, Edentulous Upper   Pulmonary neg COPD, former smoker,           Cardiovascular hypertension, + Peripheral Vascular Disease  (-) Past MI   Echo 2019: - Left ventricle: The cavity size was normal. There was moderate   concentric hypertrophy. Systolic function was normal. The   estimated ejection fraction was in the range of 55% to 60%. Wall   motion was normal; there were no regional wall motion   abnormalities. Doppler parameters are consistent with abnormal   left ventricular relaxation (grade 1 diastolic dysfunction). - Aortic valve: There was mild to moderate stenosis. There was mild   regurgitation. Mean gradient (S): 9 mm Hg. Valve area (VTI): 1.46   cm^2. - Pulmonary arteries: Systolic pressure was mildly increased. PA   peak pressure: 45 mm Hg (S).   Neuro/Psych negative neurological ROS  negative psych ROS   GI/Hepatic Neg liver ROS, GERD  Controlled,Chest discomfort related to eating (not related to exertion)   Endo/Other  diabetes  Renal/GU negative Renal ROS  negative genitourinary   Musculoskeletal   Abdominal   Peds  Hematology negative hematology ROS (+)   Anesthesia Other Findings Past Medical History: No date: Diabetes mellitus without complication (New Site) No date: Hyperlipidemia No date: Hypertension No date: Osteomyelitis [facility])  Past Surgical History: 07/26/2018: AMPUTATION TOE; Right     Comment:  Procedure: AMPUTATION TOE 2ND TOE RIGHT FOOT;  Surgeon:               Albertine Patricia, DPM;  Location: [facility];  Service:               Podiatry;  Laterality: Right; No date: APPENDECTOMY     Comment:  1985 1961: BRAIN SURGERY    Comment:  removed bone from inside skull that was pressing on               optic nerve No date: HERNIA REPAIR     Comment:  umbilical [id number]  [id number]: LOWER EXTREMITY ANGIOGRAPHY; Right     Comment:  Procedure: Lower Extremity Angiography possible               intervention;  Surgeon: Katha Cabal, MD;                Location: [facility];  Service: Cardiovascular;              Laterality: Right; No date: TOE AMPUTATION     Comment:  right great toe 2016  BMI    Body Mass Index: 23.19 kg/m      Reproductive/Obstetrics negative OB ROS                            Anesthesia Physical Anesthesia Plan  ASA: II  Anesthesia Plan: General   Post-op Pain Management:    Induction:   PONV Risk Score and Plan: Propofol infusion and TIVA  Airway Management Planned: Natural Airway and Nasal Cannula  Additional Equipment:   Intra-op Plan:   Post-operative Plan:   Informed Consent: I have reviewed the patients History and Physical, chart, labs and discussed the procedure including the risks,  benefits and alternatives for the proposed anesthesia with the patient or authorized representative who has indicated his/her understanding and acceptance.     Dental Advisory Given  Plan Discussed with: Anesthesiologist and CRNA  Anesthesia Plan Comments:         Anesthesia Quick Evaluation

## 2018-12-23 NOTE — Progress Notes (Signed)
Hematology/Oncology Consult note [facility] Telephone:([phone] [phone]   Patient Care Team: McLean-Scocuzza, Nino Glow, MD as PCP - General (Internal Medicine)  REFERRING PROVIDER: Jonathon Bellows, MD  CHIEF COMPLAINTS/REASON FOR VISIT:  Evaluation of anemia and thrombocytopenia.  HISTORY OF PRESENTING ILLNESS:   Lucas Jackson is a  85 y.o.  male with PMH listed below was seen in consultation at the request of  Jonathon Bellows, MD  for evaluation of anemia and thrombocytopenia. Patient lives in a nursing home facility.  He has experienced progressively worsening dysphagia and was evaluated by gastroenterology Dr. Vicente Males.  Barium swallow study 12/07/2018 demonstrated an irregular masslike filling defect within the distal esophagus with suggestion of central ulceration.  Lesion measures at least 4 cm in length and results and long segment fixed stricture.  And was recommended EGD for further evaluation. Preop blood work showed acute drop of hemoglobin to 7 and platelet count dropped to 75,000. Patient was referred to hematology for further evaluation. He has an appointment last week and was no-show. Today his facility sent patient to our wound clinic first before sending him to our clinic  Patient denies feeling weak and fatigued.  Denies any weight loss.  Endorses ongoing dysphagia.  Denies any pain. Denies any acute bleeding events.   Review of Systems  Constitutional: Positive for appetite change and fatigue. Negative for chills and fever.  HENT:   Negative for hearing loss and voice change.   Eyes: Negative for eye problems and icterus.  Respiratory: Negative for chest tightness, cough and shortness of breath.   Cardiovascular: Negative for chest pain and leg swelling.  Gastrointestinal: Negative for abdominal distention and abdominal pain.  Endocrine: Negative for hot flashes.  Genitourinary: Negative for difficulty urinating, dysuria and frequency.     Musculoskeletal: Negative for arthralgias.  Skin: Negative for itching and rash.  Neurological: Negative for light-headedness and numbness.  Hematological: Negative for adenopathy. Does not bruise/bleed easily.  Psychiatric/Behavioral: Negative for confusion.    MEDICAL HISTORY:  Past Medical History:  Diagnosis Date   Diabetes mellitus without complication ([place])    Hyperlipidemia    Hypertension    Osteomyelitis ([place])     SURGICAL HISTORY: Past Surgical History:  Procedure Laterality Date   AMPUTATION TOE Right 07/26/2018   Procedure: AMPUTATION TOE 2ND TOE RIGHT FOOT;  Surgeon: Albertine Patricia, DPM;  Location: [facility];  Service: Podiatry;  Laterality: Right;   [place]   removed bone from inside skull that was pressing on optic nerve   HERNIA REPAIR     umbilical [id number]    LOWER EXTREMITY ANGIOGRAPHY Right 07/27/2018   Procedure: Lower Extremity Angiography possible intervention;  Surgeon: Katha Cabal, MD;  Location: [place] [facility];  Service: Cardiovascular;  Laterality: Right;   TOE AMPUTATION     right great toe 2016    SOCIAL HISTORY: Social History   Socioeconomic History   Marital status: Widowed    Spouse name: Not on file   Number of children: 3   Years of education: Not on file   Highest education level: Not on file  Occupational History   Not on file  Social Needs   Financial resource strain: Not on file   Food insecurity    Worry: Not on file    Inability: Not on file   Transportation needs    Medical: Not on file    Non-medical: Not on file  Tobacco Use   Smoking status: Former Smoker    Quit date: 1961    Years since quitting: 59.7   Smokeless tobacco: Never Used  Substance and Sexual Activity   Alcohol use: No   Drug use: Never   Sexual activity: Not Currently  Lifestyle   Physical activity    Days per week: Not on file    Minutes per session: Not on file   Stress:  Not on file  Relationships   Social connections    Talks on phone: Not on file    Gets together: Not on file    Attends religious service: Not on file    Active member of club or organization: Not on file    Attends meetings of clubs or organizations: Not on file    Relationship status: Not on file   Intimate partner violence    Fear of current or ex partner: Not on file    Emotionally abused: Not on file    Physically abused: Not on file    Forced sexual activity: Not on file  Other Topics Concern   Not on file  Social History Narrative   3 kids    Niece Peggyann [place] [id number] involved in care    Owns guns, wears seat belts, safe in relationship   Retired, widower   Some high school ed    Ambulance person    Lives at home with 2 relatives     FAMILY HISTORY: Family History  Problem Relation Age of Onset   Colon cancer Brother    Cancer Brother        colon    Stroke Brother    Heart disease Daughter        MI    ALLERGIES:  is allergic to 5-alpha reductase inhibitors.  MEDICATIONS:  Current Outpatient Medications  Medication Sig Dispense Refill   acetaminophen (TYLENOL) 500 MG tablet Take 1,000 mg by mouth 2 (two) times daily.     amLODipine (NORVASC) 2.5 MG tablet Take 1 tablet (2.5 mg total) by mouth daily. 90 tablet 3   aspirin EC 81 MG tablet Take 1 tablet (81 mg total) by mouth daily. 90 tablet 1   carvedilol (COREG) 25 MG tablet Take 1 tablet (25 mg total) by mouth 2 (two) times daily. 180 tablet 3   chlorthalidone (HYGROTON) 25 MG tablet Take 1 tablet (25 mg total) by mouth daily. (Patient taking differently: Take 12.5 mg by mouth daily. ) 90 tablet 3   diclofenac sodium (VOLTAREN) 1 % GEL Apply 2 g topically 4 (four) times daily. B/l knees. Can use up to 4 grams 100 g 12   ferrous sulfate 325 (65 FE) MG tablet Take 325 mg by mouth daily with breakfast.     magnesium hydroxide (MILK OF MAGNESIA) 400 MG/5ML suspension Take 30 mLs by mouth  daily as needed for mild constipation.     Menthol, Topical Analgesic, (BIOFREEZE EX) Apply topically. Shoulders     metFORMIN (GLUCOPHAGE) 500 MG tablet Take 1 tablet (500 mg total) by mouth daily with breakfast. 90 tablet 3   Multiple Vitamin (MULTIVITAMIN WITH MINERALS) TABS tablet Take 1 tablet by mouth daily.     Omega-3 Fatty Acids (FISH OIL) 1000 MG CAPS Take 1 capsule (1,000 mg total) by mouth 2 (two) times daily. 180 capsule 3   pantoprazole (PROTONIX) 40 MG tablet Take 1 tablet (40 mg total) by mouth daily. 30 min before breakfast 90 tablet 3  sennosides-docusate sodium (SENOKOT-S) 8.6-50 MG tablet Take 1 tablet by mouth 2 (two) times a day.     traMADol (ULTRAM) 50 MG tablet Take 1 tablet (50 mg total) by mouth 2 (two) times daily as needed. (Patient taking differently: Take 50 mg by mouth 2 (two) times daily as needed. Bid prn and has qd tramadol scheduled) 10 tablet 0   ciprofloxacin (CIPRO) 250 MG tablet      Omega-3 Fatty Acids (FISH OIL) 1000 MG CAPS Take 1,000 mg by mouth 2 (two) times a day.     sulfamethoxazole-trimethoprim (BACTRIM DS) 800-160 MG tablet Take 1 tablet by mouth 2 (two) times daily. With food 14 tablet 0   telmisartan (MICARDIS) 80 MG tablet Take 1 tablet (80 mg total) by mouth daily. 90 tablet 3   tuberculin (APLISOL) 5 UNIT/0.1ML injection Inject into the skin.     No current facility-administered medications for this visit.    Facility-Administered Medications Ordered in Other Visits  Medication Dose Route Frequency Provider Last Rate Last Dose   0.9 %  sodium chloride infusion   Intravenous Continuous Jonathon Bellows, MD 20 mL/hr at 12/23/18 1013       PHYSICAL EXAMINATION: ECOG PERFORMANCE STATUS: 2 - Symptomatic, <50% confined to bed Vitals:   12/22/18 1134  BP: 125/65  Pulse: 62  Resp: 18  Temp: 98 F (36.7 C)   Filed Weights    Physical Exam Constitutional:      General: He is not in acute distress.    Comments: Frail  appearance, sitting in wheelchair.  HENT:     Head: Normocephalic and atraumatic.  Eyes:     General: No scleral icterus.    Pupils: Pupils are equal, round, and reactive to light.  Neck:     Musculoskeletal: Normal range of motion and neck supple.  Cardiovascular:     Rate and Rhythm: Normal rate and regular rhythm.     Heart sounds: Normal heart sounds.  Pulmonary:     Effort: Pulmonary effort is normal. No respiratory distress.     Breath sounds: No wheezing.  Abdominal:     General: Bowel sounds are normal. There is no distension.     Palpations: Abdomen is soft. There is no mass.     Tenderness: There is no abdominal tenderness.  Musculoskeletal: Normal range of motion.        General: No deformity.  Skin:    General: Skin is warm and dry.     Findings: No erythema or rash.  Neurological:     Mental Status: He is alert and oriented to person, place, and time.     Cranial Nerves: No cranial nerve deficit.     Coordination: Coordination normal.  Psychiatric:        Behavior: Behavior normal.        Thought Content: Thought content normal.     LABORATORY DATA:  I have reviewed the data as listed Lab Results  Component Value Date   WBC 4.0 12/22/2018   HGB 10.8 (L) 12/22/2018   HCT 33.4 (L) 12/22/2018   MCV 74.6 (L) 12/22/2018   PLT 228 12/22/2018   Recent Labs    02/10/18 1320 07/24/18 1612  07/28/18 0306 07/29/18 0440 12/22/18 1158  NA 139 135   < > 138 140 137  K 4.0 4.0   < > 3.7 3.9 4.1  CL 103 99   < > 103 107 102  CO2 29 25   < > 26 25 26  GLUCOSE 141* 146*   < > 110* 89 118*  BUN 14 44*   < > 19 15 23   CREATININE 1.09 1.68*   < > 1.11 1.01 1.24  CALCIUM 9.9 9.0   < > 7.9* 8.0* 9.2  GFRNONAA  --  36*   < > 59* >60 51*  GFRAA  --  41*   < > >60 >60 59*  PROT 8.2 8.5*  --   --   --  7.3  ALBUMIN 4.3 3.1*  --   --   --  3.3*  AST 39* 25  --   --   --  23  ALT 20 19  --   --   --  15  ALKPHOS 64 85  --   --   --  70  BILITOT 0.5 1.0  --   --   --   0.6   < > = values in this interval not displayed.   Iron/TIBC/Ferritin/ %Sat    Component Value Date/Time   IRON 30 (L) 12/08/2018 1348   TIBC 293 12/08/2018 1348   FERRITIN 116 12/08/2018 1348   IRONPCTSAT 10 (L) 12/08/2018 1348   IRONPCTSAT 26 08/05/2017 1601      RADIOGRAPHIC STUDIES: I have personally reviewed the radiological images as listed and agreed with the findings in the report.  Vas [place] Burnard Bunting With/wo Tbi  Result Date: 12/06/2018 LOWER EXTREMITY DOPPLER STUDY Indications: Peripheral artery disease. High Risk Factors: Diabetes.  Vascular Interventions: 07/26/2018 right PTA                         Right great toe removed 2016, small toe 2020. Comparison Study: 08/07/2018 Performing Technologist: Concha Norway RVT  Examination Guidelines: A complete evaluation includes at minimum, Doppler waveform signals and systolic blood pressure reading at the level of bilateral brachial, anterior tibial, and posterior tibial arteries, when vessel segments are accessible. Bilateral testing is considered an integral part of a complete examination. Photoelectric Plethysmograph (PPG) waveforms and toe systolic pressure readings are included as required and additional duplex testing as needed. Limited examinations for reoccurring indications may be performed as noted.  ABI Findings: +---------+------------------+-----+--------+----------------------+  Right     Rt Pressure (mmHg) Index Waveform Comment                 +---------+------------------+-----+--------+----------------------+  Brachial  114                                                       +---------+------------------+-----+--------+----------------------+  ATA       126                1.10  biphasic                         +---------+------------------+-----+--------+----------------------+  PTA       142                1.23  biphasic                         +---------+------------------+-----+--------+----------------------+  Great Toe  amputated               +---------+------------------+-----+--------+----------------------+  2nd toe                            normal   too small for pressure  +---------+------------------+-----+--------+----------------------+ +---------+------------------+-----+--------+-------+  Left      Lt Pressure (mmHg) Index Waveform Comment  +---------+------------------+-----+--------+-------+  Brachial  115                                        +---------+------------------+-----+--------+-------+  ATA                                absent            +---------+------------------+-----+--------+-------+  PTA       142                1.23  biphasic          +---------+------------------+-----+--------+-------+  Great Toe 97                 0.84  Normal            +---------+------------------+-----+--------+-------+ +-------+-----------+-----------+------------+------------+  ABI/TBI Today's ABI Today's TBI Previous ABI Previous TBI  +-------+-----------+-----------+------------+------------+  Right   1.23                    .99                        +-------+-----------+-----------+------------+------------+  Left    1.23        .84         1.14                       +-------+-----------+-----------+------------+------------+ Bilateral ABIs appear essentially unchanged compared to prior study on 09/02/2018.  Summary: Right: Resting right ankle-brachial index is within normal range. No evidence of significant right lower extremity arterial disease. Left: Resting left ankle-brachial index is within normal range. No evidence of significant left lower extremity arterial disease. The left toe-brachial index is normal.  *See table(s) above for measurements and observations.  Electronically signed by Leotis Pain MD on 12/06/2018 at 3:45:35 PM.    Final    Vas [place] Lower Extremity Arterial Duplex  Result Date: 12/06/2018 LOWER EXTREMITY ARTERIAL DUPLEX STUDY Indications: Peripheral artery disease.   Vascular Interventions: 08/04/2018 rt pta. Current ABI:            rt = 1.23 ; lt = 1.23 Performing Technologist: Concha Norway RVT  Examination Guidelines: A complete evaluation includes B-mode imaging, spectral Doppler, color Doppler, and power Doppler as needed of all accessible portions of each vessel. Bilateral testing is considered an integral part of a complete examination. Limited examinations for reoccurring indications may be performed as noted.  +----------+--------+-----+--------+----------+--------+  RIGHT      PSV cm/s Ratio Stenosis Waveform   Comments  +----------+--------+-----+--------+----------+--------+  CFA Mid    125                     triphasic            +----------+--------+-----+--------+----------+--------+  DFA        36  biphasic             +----------+--------+-----+--------+----------+--------+  SFA Prox   69                      biphasic             +----------+--------+-----+--------+----------+--------+  SFA Mid    68                      biphasic             +----------+--------+-----+--------+----------+--------+  SFA Distal 65                      triphasic            +----------+--------+-----+--------+----------+--------+  POP Distal 52                      biphasic             +----------+--------+-----+--------+----------+--------+  ATA Distal 35                      monophasic           +----------+--------+-----+--------+----------+--------+  PTA Distal 62                      biphasic             +----------+--------+-----+--------+----------+--------+  +----------+--------+-----+--------+---------+--------+  LEFT       PSV cm/s Ratio Stenosis Waveform  Comments  +----------+--------+-----+--------+---------+--------+  CFA Mid    130                     triphasic           +----------+--------+-----+--------+---------+--------+  DFA        52                      biphasic            +----------+--------+-----+--------+---------+--------+  SFA Prox   58                       triphasic           +----------+--------+-----+--------+---------+--------+  SFA Mid    55                      triphasic           +----------+--------+-----+--------+---------+--------+  SFA Distal 49                      biphasic            +----------+--------+-----+--------+---------+--------+  POP Distal 76                      biphasic            +----------+--------+-----+--------+---------+--------+  ATA Distal                occluded                     +----------+--------+-----+--------+---------+--------+  PTA Distal 82                      biphasic            +----------+--------+-----+--------+---------+--------+  Summary: Right: Mild atherosclerosis to the popliteal artery. Moderate calf vessel atherosclerosis. Left:  Mild atherosclerosis to the popliteal artery. Moderate calf vessel atherosclerosis. Distal ATA occluded.  See table(s) above for measurements and observations. Electronically signed by Leotis Pain MD on 12/06/2018 at 3:45:25 PM.    Final    Dg Esophagus W Single Cm (sol Or Thin Ba)  Result Date: 12/07/2018 CLINICAL DATA:  Dysphagia EXAM: ESOPHOGRAM/BARIUM SWALLOW TECHNIQUE: Single contrast examination was performed using thin barium or water soluble. FLUOROSCOPY TIME:  Fluoroscopy Time:  54 seconds Radiation Exposure Index (if provided by the fluoroscopic device): 30.2 mGy Number of Acquired Spot Images: 16 full exposures COMPARISON:  None. FINDINGS: Within the distal esophagus approximately 2.5 cm proximal to the gastroesophageal junction is an eccentric area of fixed, irregular luminal narrowing of the esophageal wall. This large eccentric lesion with lobulated margins measures approximately 4 cm in length with suggestion of a central ulceration. There is proximal dilation of the esophagus upstream of this lesion. Delayed passage of the barium column with extensive non propulsive tertiary contractions. Contrast was able to traverse the area of luminal narrowing and  pass into the stomach. IMPRESSION: 1. Irregular masslike filling defect within the distal esophagus with suggestion of a central ulceration. Lesion measures at least 4 cm in length and results in a long segment fixed stricture. Findings are highly suspicious for esophageal carcinoma and further evaluation with endoscopy and tissue sampling is recommended. 2. Esophageal dysmotility. These results will be called to the ordering clinician or representative by the Radiologist Assistant, and communication documented in the PACS or zVision Dashboard. Electronically Signed   By: Davina Poke M.D.   On: 12/07/2018 10:36      ASSESSMENT & PLAN:  1. Microcytic anemia   2. Thrombocytopenia ([place])   3. Dysphagia, unspecified type    Labs are reviewed and discussed with patient.  Chronic microcytic anemia, He has had iron panel done which showed ferritin level 116, iron saturation 10%, TIBC 293, not typical for iron deficiency. Normal folate level.  Borderline vitamin B12 level. Acute drop of hemoglobin and thrombocytopenia.  Etiology unknown. Check CBC, reticulocyte panel, smear, CMP, immature platelet fraction, TSH MMA, hemoglobinopathy evaluation.  Flow cytometry. Repeat blood work today showed hemoglobin of 10.8, MCV 74.6 complete resolution of thrombocytopenia with normal platelet counts of 228.  Normal differential.  Reticulocyte panel shows normal reticulocyte hemoglobin, consistent with iron deficiency. Hemoglobinopathy evaluation still pending. Patient appears to have chronic anemia. No etiology is unknown why he acutely dropped hemoglobin on 12/08/2018, it is reassuring that platelet count has normalized and hemoglobin has improved.  He can go ahead and proceed with EGD. Awaiting blood work results.  Dysphagia, communicated with Dr. Vicente Males about patient's blood work.  Patient will proceed with EGD on 12/23/2018.  We will follow-up EGD report and pathology.   Orders Placed This Encounter    Procedures   CBC with Differential/Platelet    Standing Status:   Future    Number of Occurrences:   1    Standing Expiration Date:   12/22/2019   Technologist smear review    Standing Status:   Future    Standing Expiration Date:   12/22/2019   Retic Panel    Standing Status:   Future    Number of Occurrences:   1    Standing Expiration Date:   12/22/2019   Hemoglobinopathy evaluation    Standing Status:   Future    Number of Occurrences:   1    Standing Expiration Date:   12/22/2019   Comprehensive metabolic panel  Standing Status:   Future    Number of Occurrences:   1    Standing Expiration Date:   12/22/2019   Immature Platelet Fraction    Standing Status:   Future    Number of Occurrences:   1    Standing Expiration Date:   12/22/2019   Methylmalonic acid, serum    Standing Status:   Future    Number of Occurrences:   1    Standing Expiration Date:   12/22/2019   TSH    Standing Status:   Future    Number of Occurrences:   1    Standing Expiration Date:   12/22/2019   Flow cytometry panel-leukemia/lymphoma work-up    Standing Status:   Future    Number of Occurrences:   1    Standing Expiration Date:   12/22/2019    All questions were answered. The patient knows to call the clinic with any problems questions or concerns.  cc Jonathon Bellows, MD    Return of visit: Follow-up to be determined Thank you for this kind referral and the opportunity to participate in the care of this patient. A copy of today's note is routed to referring provider  Total face to face encounter time for this patient visit was 45 min. >50% of the time was  spent in counseling and coordination of care.    Earlie Server, MD, PhD Hematology Oncology [facility] at [facility] Pager- [phone] 12/23/2018

## 2018-12-23 NOTE — H&P (Signed)
Jonathon Bellows, MD [address] [place], [place], [place], [address] [address], [place], [place], [place], [address] Phone: [phone]  Fax: [phone]  Primary Care Physician:  McLean-Scocuzza, Nino Glow, MD   Pre-Procedure History & Physical: HPI:  Lucas Jackson is a 85 y.o. male is here for an endoscopy    Past Medical History:  Diagnosis Date  . Diabetes mellitus without complication ([place])   . Hyperlipidemia   . Hypertension   . Osteomyelitis [facility])     Past Surgical History:  Procedure Laterality Date  . AMPUTATION TOE Right 07/26/2018   Procedure: AMPUTATION TOE 2ND TOE RIGHT FOOT;  Surgeon: Albertine Patricia, DPM;  Location: [facility];  Service: Podiatry;  Laterality: Right;  . APPENDECTOMY     1985  . BRAIN SURGERY  1961   removed bone from inside skull that was pressing on optic nerve  . HERNIA REPAIR     umbilical [id number]   . LOWER EXTREMITY ANGIOGRAPHY Right 07/27/2018   Procedure: Lower Extremity Angiography possible intervention;  Surgeon: Katha Cabal, MD;  Location: [facility];  Service: Cardiovascular;  Laterality: Right;  . TOE AMPUTATION     right great toe 2016    Prior to Admission medications   Medication Sig Start Date End Date Taking? Authorizing Provider  acetaminophen (TYLENOL) 500 MG tablet Take 1,000 mg by mouth 2 (two) times daily.   Yes [provider]  amLODipine (NORVASC) 2.5 MG tablet Take 1 tablet (2.5 mg total) by mouth daily. 06/09/18  Yes McLean-Scocuzza, Nino Glow, MD  aspirin EC 81 MG tablet Take 1 tablet (81 mg total) by mouth daily. 10/28/17  Yes McLean-Scocuzza, Nino Glow, MD  carvedilol (COREG) 25 MG tablet Take 1 tablet (25 mg total) by mouth 2 (two) times daily. 02/10/18  Yes McLean-Scocuzza, Nino Glow, MD  chlorthalidone (HYGROTON) 25 MG tablet Take 1 tablet (25 mg total) by mouth daily. Patient taking differently: Take 12.5 mg by mouth daily.  02/10/18  Yes McLean-Scocuzza, Nino Glow, MD  ciprofloxacin  (CIPRO) 250 MG tablet  11/19/18  Yes [provider]  diclofenac sodium (VOLTAREN) 1 % GEL Apply 2 g topically 4 (four) times daily. B/l knees. Can use up to 4 grams 03/09/18  Yes McLean-Scocuzza, Nino Glow, MD  ferrous sulfate 325 (65 FE) MG tablet Take 325 mg by mouth daily with breakfast.   Yes [provider]  magnesium hydroxide (MILK OF MAGNESIA) 400 MG/5ML suspension Take 30 mLs by mouth daily as needed for mild constipation.   Yes [provider]  Menthol, Topical Analgesic, (BIOFREEZE EX) Apply topically. Shoulders   Yes [provider]  metFORMIN (GLUCOPHAGE) 500 MG tablet Take 1 tablet (500 mg total) by mouth daily with breakfast. 10/11/18  Yes McLean-Scocuzza, Nino Glow, MD  Multiple Vitamin (MULTIVITAMIN WITH MINERALS) TABS tablet Take 1 tablet by mouth daily.   Yes [provider]  Omega-3 Fatty Acids (FISH OIL) 1000 MG CAPS Take 1 capsule (1,000 mg total) by mouth 2 (two) times daily. 02/10/18  Yes McLean-Scocuzza, Nino Glow, MD  Omega-3 Fatty Acids (FISH OIL) 1000 MG CAPS Take 1,000 mg by mouth 2 (two) times a day.   Yes [provider]  pantoprazole (PROTONIX) 40 MG tablet Take 1 tablet (40 mg total) by mouth daily. 30 min before breakfast 11/15/18  Yes McLean-Scocuzza, Nino Glow, MD  sennosides-docusate sodium (SENOKOT-S) 8.6-50 MG tablet Take 1 tablet by mouth 2 (two) times a day.  Yes [provider]  sulfamethoxazole-trimethoprim (BACTRIM DS) 800-160 MG tablet Take 1 tablet by mouth 2 (two) times daily. With food 11/21/18  Yes McLean-Scocuzza, Nino Glow, MD  telmisartan (MICARDIS) 80 MG tablet Take 1 tablet (80 mg total) by mouth daily. 02/10/18  Yes McLean-Scocuzza, Nino Glow, MD  traMADol (ULTRAM) 50 MG tablet Take 1 tablet (50 mg total) by mouth 2 (two) times daily as needed. Patient taking differently: Take 50 mg by mouth 2 (two) times daily as needed. Bid prn and has qd tramadol scheduled 10/28/17  Yes McLean-Scocuzza, Nino Glow, MD   tuberculin (APLISOL) 5 UNIT/0.1ML injection Inject into the skin.   Yes [provider]    Allergies as of 12/22/2018 - Review Complete 12/22/2018  Allergen Reaction Noted  . 5-alpha reductase inhibitors  06/09/2018    Family History  Problem Relation Age of Onset  . Colon cancer Brother   . Cancer Brother        colon   . Stroke Brother   . Heart disease Daughter        MI    Social History   Socioeconomic History  . Marital status: Widowed    Spouse name: Not on file  . Number of children: 3  . Years of education: Not on file  . Highest education level: Not on file  Occupational History  . Not on file  Social Needs  . Financial resource strain: Not on file  . Food insecurity    Worry: Not on file    Inability: Not on file  . Transportation needs    Medical: Not on file    Non-medical: Not on file  Tobacco Use  . Smoking status: Former Smoker    Quit date: 1961    Years since quitting: 59.7  . Smokeless tobacco: Never Used  Substance and Sexual Activity  . Alcohol use: No  . Drug use: Never  . Sexual activity: Not Currently  Lifestyle  . Physical activity    Days per week: Not on file    Minutes per session: Not on file  . Stress: Not on file  Relationships  . Social Herbalist on phone: Not on file    Gets together: Not on file    Attends religious service: Not on file    Active member of club or organization: Not on file    Attends meetings of clubs or organizations: Not on file    Relationship status: Not on file  . Intimate partner violence    Fear of current or ex partner: Not on file    Emotionally abused: Not on file    Physically abused: Not on file    Forced sexual activity: Not on file  Other Topics Concern  . Not on file  Social History Narrative   3 kids    Niece Peggyann [place] [phone] involved in care    Owns guns, wears seat belts, safe in relationship   Retired, widower   Some high school ed    Audiological scientist    Lives at home with 2 relatives     Review of Systems: See HPI, otherwise negative ROS  Physical Exam: BP (!) 120/54   Pulse (!) 59   Temp (!) 96.8 F (36 C) (Tympanic)   Resp 20   Ht 6' (1.829 m)   Wt 77.6 kg   SpO2 90%   BMI 23.19 kg/m  General:   Alert,  pleasant and  cooperative in NAD Head:  Normocephalic and atraumatic. Neck:  Supple; no masses or thyromegaly. Lungs:  Clear throughout to auscultation, normal respiratory effort.    Heart:  +S1, +S2, Regular rate and rhythm, No edema. Abdomen:  Soft, nontender and nondistended. Normal bowel sounds, without guarding, and without rebound.   Neurologic:  Alert and  oriented x4;  grossly normal neurologically.  Impression/Plan: Lucas Jackson is here for an endoscopy  to be performed for  evaluation of abnormal imaging of the esophagus    Risks, benefits, limitations, and alternatives regarding endoscopy have been reviewed with the patient.  Questions have been answered.  All parties agreeable.   Jonathon Bellows, MD  12/23/2018, 10:37 AM

## 2018-12-23 NOTE — Anesthesia Postprocedure Evaluation (Signed)
Anesthesia Post Note  Patient: Lucas Jackson  Procedure(s) Performed: ESOPHAGOGASTRODUODENOSCOPY (EGD) WITH PROPOFOL (N/A )  Patient location during evaluation: PACU Anesthesia Type: General Level of consciousness: awake and alert Pain management: pain level controlled Vital Signs Assessment: post-procedure vital signs reviewed and stable Respiratory status: spontaneous breathing, nonlabored ventilation and respiratory function stable Cardiovascular status: blood pressure returned to baseline and stable Postop Assessment: no apparent nausea or vomiting Anesthetic complications: no     Last Vitals:  Vitals:   12/23/18 1120 12/23/18 1130  BP: (!) 100/59 110/60  Pulse: 65 63  Resp: 17 13  Temp:    SpO2: 98% 99%    Last Pain:  Vitals:   12/23/18 1100  TempSrc: Tympanic  PainSc:                  Durenda Hurt

## 2018-12-23 NOTE — Anesthesia Post-op Follow-up Note (Signed)
Anesthesia QCDR form completed.        

## 2018-12-26 ENCOUNTER — Encounter: Payer: Self-pay | Admitting: Gastroenterology

## 2018-12-26 DIAGNOSIS — Z1159 Encounter for screening for other viral diseases: Secondary | ICD-10-CM | POA: Diagnosis not present

## 2018-12-26 LAB — SURGICAL PATHOLOGY

## 2018-12-27 ENCOUNTER — Ambulatory Visit: Payer: Medicare Other | Admitting: Gastroenterology

## 2018-12-27 LAB — METHYLMALONIC ACID, SERUM: Methylmalonic Acid, Quantitative: 264 nmol/L (ref 0–378)

## 2018-12-27 LAB — COMP PANEL: LEUKEMIA/LYMPHOMA: CLINICAL INFO: 17

## 2018-12-28 ENCOUNTER — Encounter: Payer: Self-pay | Admitting: Family Medicine

## 2018-12-30 ENCOUNTER — Inpatient Hospital Stay (HOSPITAL_BASED_OUTPATIENT_CLINIC_OR_DEPARTMENT_OTHER): Payer: Medicare Other | Admitting: Oncology

## 2018-12-30 ENCOUNTER — Encounter: Payer: Self-pay | Admitting: Oncology

## 2018-12-30 ENCOUNTER — Other Ambulatory Visit: Payer: Self-pay

## 2018-12-30 VITALS — BP 127/65 | HR 58 | Temp 98.1°F | Resp 16

## 2018-12-30 DIAGNOSIS — Z7189 Other specified counseling: Secondary | ICD-10-CM | POA: Diagnosis not present

## 2018-12-30 DIAGNOSIS — C155 Malignant neoplasm of lower third of esophagus: Secondary | ICD-10-CM | POA: Diagnosis not present

## 2018-12-30 DIAGNOSIS — D563 Thalassemia minor: Secondary | ICD-10-CM | POA: Diagnosis not present

## 2018-12-30 DIAGNOSIS — D509 Iron deficiency anemia, unspecified: Secondary | ICD-10-CM | POA: Diagnosis not present

## 2018-12-30 NOTE — Progress Notes (Signed)
Patient is being seem by MD in the community room so he can have 2 family member present (MD requested)

## 2018-12-31 DIAGNOSIS — S91309A Unspecified open wound, unspecified foot, initial encounter: Secondary | ICD-10-CM | POA: Diagnosis not present

## 2018-12-31 DIAGNOSIS — Z4781 Encounter for orthopedic aftercare following surgical amputation: Secondary | ICD-10-CM | POA: Diagnosis not present

## 2018-12-31 DIAGNOSIS — M869 Osteomyelitis, unspecified: Secondary | ICD-10-CM | POA: Diagnosis not present

## 2018-12-31 DIAGNOSIS — M6281 Muscle weakness (generalized): Secondary | ICD-10-CM | POA: Diagnosis not present

## 2018-12-31 NOTE — Progress Notes (Signed)
Hematology/Oncology  Follow up note [facility] [phone] Fax:([phone]   Patient Care Team: McLean-Scocuzza, Nino Glow, MD as PCP - General (Internal Medicine) Clent Jacks, RN as Oncology Nurse Navigator  REFERRING PROVIDER: McLean-Scocuzza, Olivia Mackie *  CHIEF COMPLAINTS/REASON FOR VISIT:  Follow up for discussion of new cancer diagnosis, anemia and thrombocytopenia.  HISTORY OF PRESENTING ILLNESS:   Lucas Jackson is a  85 y.o.  male with PMH listed below was seen in consultation at the request of  McLean-Scocuzza, Olivia Mackie *  for evaluation of anemia and thrombocytopenia. Patient lives in a nursing home facility.  He has experienced progressively worsening dysphagia and was evaluated by gastroenterology Dr. Vicente Males.  Barium swallow study 12/07/2018 demonstrated an irregular masslike filling defect within the distal esophagus with suggestion of central ulceration.  Lesion measures at least 4 cm in length and results and long segment fixed stricture.  And was recommended EGD for further evaluation. Preop blood work showed acute drop of hemoglobin to 7 and platelet count dropped to 75,000. Patient was referred to hematology for further evaluation. He has an appointment last week and was no-show. Today his facility sent patient to our wound clinic first before sending him to our clinic  Patient denies feeling weak and fatigued.  Denies any weight loss.  Endorses ongoing dysphagia.  Denies any pain. Denies any acute bleeding events.  INTERVAL HISTORY Lucas Jackson is a 85 y.o. male who has above history reviewed by me today presents for follow up visit for discussion of cancer diagnosis.  Problems and complaints are listed below: S/p EGD on 12/23/2018 for work-up of dysphagia. EGD showed large fungating and ulcerating mass with no bleeding, in the lower third of esophagus.  30 to 35 cm from incisors.  Partially obstructing and partially  circumferential.  Biopsy was taken. Pathology showed invasive poorly differentiated squamous cell carcinoma. Today patient was accompanied by her niece. Patient lives in a facility.  He reports food being stuck in his food pipe.  Going down slowly. Sometimes he has sternal discomfort.   Review of Systems  Constitutional: Positive for appetite change and fatigue. Negative for chills, fever and unexpected weight change.  HENT:   Negative for hearing loss and voice change.   Eyes: Negative for eye problems and icterus.  Respiratory: Negative for chest tightness, cough and shortness of breath.   Cardiovascular: Negative for chest pain and leg swelling.  Gastrointestinal: Negative for abdominal distention and abdominal pain.  Endocrine: Negative for hot flashes.  Genitourinary: Negative for difficulty urinating, dysuria and frequency.   Musculoskeletal: Negative for arthralgias.  Skin: Negative for itching and rash.  Neurological: Negative for light-headedness and numbness.  Hematological: Negative for adenopathy. Does not bruise/bleed easily.  Psychiatric/Behavioral: Negative for confusion.    MEDICAL HISTORY:  Past Medical History:  Diagnosis Date   Diabetes mellitus without complication ([place])    Hyperlipidemia    Hypertension    Osteomyelitis ([place])     SURGICAL HISTORY: Past Surgical History:  Procedure Laterality Date   AMPUTATION TOE Right 07/26/2018   Procedure: AMPUTATION TOE 2ND TOE RIGHT FOOT;  Surgeon: Albertine Patricia, DPM;  Location: [facility] OR[facility];  Service: Podiatry;  Laterality: Right;   Sargeant   removed bone from inside skull that was pressing on optic nerve   ESOPHAGOGASTRODUODENOSCOPY (EGD) WITH PROPOFOL N/A 12/23/2018   Procedure: ESOPHAGOGASTRODUODENOSCOPY (EGD) WITH PROPOFOL;  Surgeon: Jonathon Bellows, MD;  Location: [facility]  ENDOSCOPY;  Service: Gastroenterology;  Laterality: N/A;  Urgent   HERNIA REPAIR     umbilical [id number]      LOWER EXTREMITY ANGIOGRAPHY Right 07/27/2018   Procedure: Lower Extremity Angiography possible intervention;  Surgeon: Katha Cabal, MD;  Location: [facility];  Service: Cardiovascular;  Laterality: Right;   TOE AMPUTATION     right great toe 2016    SOCIAL HISTORY: Social History   Socioeconomic History   Marital status: Widowed    Spouse name: Not on file   Number of children: 3   Years of education: Not on file   Highest education level: Not on file  Occupational History   Not on file  Social Needs   Financial resource strain: Not on file   Food insecurity    Worry: Not on file    Inability: Not on file   Transportation needs    Medical: Not on file    Non-medical: Not on file  Tobacco Use   Smoking status: Former Smoker    Quit date: 1961    Years since quitting: 59.7   Smokeless tobacco: Never Used  Substance and Sexual Activity   Alcohol use: No   Drug use: Never   Sexual activity: Not Currently  Lifestyle   Physical activity    Days per week: Not on file    Minutes per session: Not on file   Stress: Not on file  Relationships   Social connections    Talks on phone: Not on file    Gets together: Not on file    Attends religious service: Not on file    Active member of club or organization: Not on file    Attends meetings of clubs or organizations: Not on file    Relationship status: Not on file   Intimate partner violence    Fear of current or ex partner: Not on file    Emotionally abused: Not on file    Physically abused: Not on file    Forced sexual activity: Not on file  Other Topics Concern   Not on file  Social History Narrative   3 kids    Niece Peggyann [place] [phone] involved in care    Owns guns, wears seat belts, safe in relationship   Retired, widower   Some high school ed    Ambulance person    Lives at home with 2 relatives     FAMILY HISTORY: Family History  Problem Relation Age of Onset    Colon cancer Brother    Cancer Brother        colon    Stroke Brother    Heart disease Daughter        MI    ALLERGIES:  is allergic to 5-alpha reductase inhibitors.  MEDICATIONS:  Current Outpatient Medications  Medication Sig Dispense Refill   acetaminophen (TYLENOL) 500 MG tablet Take 1,000 mg by mouth 2 (two) times daily.     amLODipine (NORVASC) 2.5 MG tablet Take 1 tablet (2.5 mg total) by mouth daily. 90 tablet 3   aspirin EC 81 MG tablet Take 1 tablet (81 mg total) by mouth daily. 90 tablet 1   carvedilol (COREG) 25 MG tablet Take 1 tablet (25 mg total) by mouth 2 (two) times daily. 180 tablet 3   chlorthalidone (HYGROTON) 25 MG tablet Take 1 tablet (25 mg total) by mouth daily. (Patient taking differently: Take 12.5 mg by mouth daily. ) 90 tablet 3  diclofenac sodium (VOLTAREN) 1 % GEL Apply 2 g topically 4 (four) times daily. B/l knees. Can use up to 4 grams 100 g 12   ferrous sulfate 325 (65 FE) MG tablet Take 325 mg by mouth daily with breakfast.     magnesium hydroxide (MILK OF MAGNESIA) 400 MG/5ML suspension Take 30 mLs by mouth daily as needed for mild constipation.     Menthol, Topical Analgesic, (BIOFREEZE EX) Apply topically. Shoulders     metFORMIN (GLUCOPHAGE) 500 MG tablet Take 1 tablet (500 mg total) by mouth daily with breakfast. 90 tablet 3   Multiple Vitamin (MULTIVITAMIN WITH MINERALS) TABS tablet Take 1 tablet by mouth daily.     Omega-3 Fatty Acids (FISH OIL) 1000 MG CAPS Take 1 capsule (1,000 mg total) by mouth 2 (two) times daily. 180 capsule 3   Omega-3 Fatty Acids (FISH OIL) 1000 MG CAPS Take 1,000 mg by mouth 2 (two) times a day.     pantoprazole (PROTONIX) 40 MG tablet Take 1 tablet (40 mg total) by mouth daily. 30 min before breakfast 90 tablet 3   sennosides-docusate sodium (SENOKOT-S) 8.6-50 MG tablet Take 1 tablet by mouth 2 (two) times a day.     sulfamethoxazole-trimethoprim (BACTRIM DS) 800-160 MG tablet Take 1 tablet by  mouth 2 (two) times daily. With food 14 tablet 0   telmisartan (MICARDIS) 80 MG tablet Take 1 tablet (80 mg total) by mouth daily. 90 tablet 3   traMADol (ULTRAM) 50 MG tablet Take 1 tablet (50 mg total) by mouth 2 (two) times daily as needed. (Patient taking differently: Take 50 mg by mouth 2 (two) times daily as needed. Bid prn and has qd tramadol scheduled) 10 tablet 0   ciprofloxacin (CIPRO) 250 MG tablet      tuberculin (APLISOL) 5 UNIT/0.1ML injection Inject into the skin.     No current facility-administered medications for this visit.      PHYSICAL EXAMINATION: ECOG PERFORMANCE STATUS: 2 - Symptomatic, <50% confined to bed Vitals:   12/30/18 1443  BP: 127/65  Pulse: (!) 58  Resp: 16  Temp: 98.1 F (36.7 C)   There were no vitals filed for this visit.  Physical Exam Constitutional:      General: He is not in acute distress.    Comments: Frail appearance, sitting in wheelchair.  HENT:     Head: Normocephalic and atraumatic.  Eyes:     General: No scleral icterus.    Pupils: Pupils are equal, round, and reactive to light.  Neck:     Musculoskeletal: Normal range of motion and neck supple.  Cardiovascular:     Rate and Rhythm: Normal rate and regular rhythm.     Heart sounds: Normal heart sounds.  Pulmonary:     Effort: Pulmonary effort is normal. No respiratory distress.     Breath sounds: No wheezing.  Abdominal:     General: Bowel sounds are normal. There is no distension.     Palpations: Abdomen is soft. There is no mass.     Tenderness: There is no abdominal tenderness.  Musculoskeletal: Normal range of motion.        General: No deformity.  Skin:    General: Skin is warm and dry.     Findings: No erythema or rash.  Neurological:     Mental Status: He is alert and oriented to person, place, and time.     Cranial Nerves: No cranial nerve deficit.     Coordination: Coordination normal.  Psychiatric:  Behavior: Behavior normal.        Thought  Content: Thought content normal.     LABORATORY DATA:  I have reviewed the data as listed Lab Results  Component Value Date   WBC 4.0 12/22/2018   HGB 10.8 (L) 12/22/2018   HCT 33.4 (L) 12/22/2018   MCV 74.6 (L) 12/22/2018   PLT 228 12/22/2018   Recent Labs    02/10/18 1320 07/24/18 1612  07/28/18 0306 07/29/18 0440 12/22/18 1158  NA 139 135   < > 138 140 137  K 4.0 4.0   < > 3.7 3.9 4.1  CL 103 99   < > 103 107 102  CO2 29 25   < > 26 25 26   GLUCOSE 141* 146*   < > 110* 89 118*  BUN 14 44*   < > 19 15 23   CREATININE 1.09 1.68*   < > 1.11 1.01 1.24  CALCIUM 9.9 9.0   < > 7.9* 8.0* 9.2  GFRNONAA  --  36*   < > 59* >60 51*  GFRAA  --  41*   < > >60 >60 59*  PROT 8.2 8.5*  --   --   --  7.3  ALBUMIN 4.3 3.1*  --   --   --  3.3*  AST 39* 25  --   --   --  23  ALT 20 19  --   --   --  15  ALKPHOS 64 85  --   --   --  70  BILITOT 0.5 1.0  --   --   --  0.6   < > = values in this interval not displayed.   Iron/TIBC/Ferritin/ %Sat    Component Value Date/Time   IRON 30 (L) 12/08/2018 1348   TIBC 293 12/08/2018 1348   FERRITIN 116 12/08/2018 1348   IRONPCTSAT 10 (L) 12/08/2018 1348   IRONPCTSAT 26 08/05/2017 1601      RADIOGRAPHIC STUDIES: I have personally reviewed the radiological images as listed and agreed with the findings in the report.  Vas [place] Burnard Bunting With/wo Tbi  Result Date: 12/06/2018 LOWER EXTREMITY DOPPLER STUDY Indications: Peripheral artery disease. High Risk Factors: Diabetes.  Vascular Interventions: 07/26/2018 right PTA                         Right great toe removed 2016, small toe 2020. Comparison Study: 08/07/2018 Performing Technologist: Concha Norway RVT  Examination Guidelines: A complete evaluation includes at minimum, Doppler waveform signals and systolic blood pressure reading at the level of bilateral brachial, anterior tibial, and posterior tibial arteries, when vessel segments are accessible. Bilateral testing is considered an integral part of a  complete examination. Photoelectric Plethysmograph (PPG) waveforms and toe systolic pressure readings are included as required and additional duplex testing as needed. Limited examinations for reoccurring indications may be performed as noted.  ABI Findings: +---------+------------------+-----+--------+----------------------+  Right     Rt Pressure (mmHg) Index Waveform Comment                 +---------+------------------+-----+--------+----------------------+  Brachial  114                                                       +---------+------------------+-----+--------+----------------------+  ATA  126                1.10  biphasic                         +---------+------------------+-----+--------+----------------------+  PTA       142                1.23  biphasic                         +---------+------------------+-----+--------+----------------------+  Great Toe                                   amputated               +---------+------------------+-----+--------+----------------------+  2nd toe                            normal   too small for pressure  +---------+------------------+-----+--------+----------------------+ +---------+------------------+-----+--------+-------+  Left      Lt Pressure (mmHg) Index Waveform Comment  +---------+------------------+-----+--------+-------+  Brachial  115                                        +---------+------------------+-----+--------+-------+  ATA                                absent            +---------+------------------+-----+--------+-------+  PTA       142                1.23  biphasic          +---------+------------------+-----+--------+-------+  Great Toe 97                 0.84  Normal            +---------+------------------+-----+--------+-------+ +-------+-----------+-----------+------------+------------+  ABI/TBI Today's ABI Today's TBI Previous ABI Previous TBI  +-------+-----------+-----------+------------+------------+  Right   1.23                     .99                        +-------+-----------+-----------+------------+------------+  Left    1.23        .84         1.14                       +-------+-----------+-----------+------------+------------+ Bilateral ABIs appear essentially unchanged compared to prior study on 09/02/2018.  Summary: Right: Resting right ankle-brachial index is within normal range. No evidence of significant right lower extremity arterial disease. Left: Resting left ankle-brachial index is within normal range. No evidence of significant left lower extremity arterial disease. The left toe-brachial index is normal.  *See table(s) above for measurements and observations.  Electronically signed by Leotis Pain MD on 12/06/2018 at 3:45:35 PM.    Final    Vas [place] Lower Extremity Arterial Duplex  Result Date: 12/06/2018 LOWER EXTREMITY ARTERIAL DUPLEX STUDY Indications: Peripheral artery disease.  Vascular Interventions: 08/04/2018 rt pta. Current ABI:            rt = 1.23 ; lt =  1.23 Performing Technologist: Concha Norway RVT  Examination Guidelines: A complete evaluation includes B-mode imaging, spectral Doppler, color Doppler, and power Doppler as needed of all accessible portions of each vessel. Bilateral testing is considered an integral part of a complete examination. Limited examinations for reoccurring indications may be performed as noted.  +----------+--------+-----+--------+----------+--------+  RIGHT      PSV cm/s Ratio Stenosis Waveform   Comments  +----------+--------+-----+--------+----------+--------+  CFA Mid    125                     triphasic            +----------+--------+-----+--------+----------+--------+  DFA        36                      biphasic             +----------+--------+-----+--------+----------+--------+  SFA Prox   69                      biphasic             +----------+--------+-----+--------+----------+--------+  SFA Mid    68                      biphasic              +----------+--------+-----+--------+----------+--------+  SFA Distal 65                      triphasic            +----------+--------+-----+--------+----------+--------+  POP Distal 52                      biphasic             +----------+--------+-----+--------+----------+--------+  ATA Distal 35                      monophasic           +----------+--------+-----+--------+----------+--------+  PTA Distal 62                      biphasic             +----------+--------+-----+--------+----------+--------+  +----------+--------+-----+--------+---------+--------+  LEFT       PSV cm/s Ratio Stenosis Waveform  Comments  +----------+--------+-----+--------+---------+--------+  CFA Mid    130                     triphasic           +----------+--------+-----+--------+---------+--------+  DFA        52                      biphasic            +----------+--------+-----+--------+---------+--------+  SFA Prox   58                      triphasic           +----------+--------+-----+--------+---------+--------+  SFA Mid    55                      triphasic           +----------+--------+-----+--------+---------+--------+  SFA Distal 49                      biphasic            +----------+--------+-----+--------+---------+--------+  POP Distal 76                      biphasic            +----------+--------+-----+--------+---------+--------+  ATA Distal                occluded                     +----------+--------+-----+--------+---------+--------+  PTA Distal 82                      biphasic            +----------+--------+-----+--------+---------+--------+  Summary: Right: Mild atherosclerosis to the popliteal artery. Moderate calf vessel atherosclerosis. Left: Mild atherosclerosis to the popliteal artery. Moderate calf vessel atherosclerosis. Distal ATA occluded.  See table(s) above for measurements and observations. Electronically signed by Leotis Pain MD on 12/06/2018 at 3:45:25 PM.    Final    Dg Esophagus W Single Cm  (sol Or Thin Ba)  Result Date: 12/07/2018 CLINICAL DATA:  Dysphagia EXAM: ESOPHOGRAM/BARIUM SWALLOW TECHNIQUE: Single contrast examination was performed using thin barium or water soluble. FLUOROSCOPY TIME:  Fluoroscopy Time:  54 seconds Radiation Exposure Index (if provided by the fluoroscopic device): 30.2 mGy Number of Acquired Spot Images: 16 full exposures COMPARISON:  None. FINDINGS: Within the distal esophagus approximately 2.5 cm proximal to the gastroesophageal junction is an eccentric area of fixed, irregular luminal narrowing of the esophageal wall. This large eccentric lesion with lobulated margins measures approximately 4 cm in length with suggestion of a central ulceration. There is proximal dilation of the esophagus upstream of this lesion. Delayed passage of the barium column with extensive non propulsive tertiary contractions. Contrast was able to traverse the area of luminal narrowing and pass into the stomach. IMPRESSION: 1. Irregular masslike filling defect within the distal esophagus with suggestion of a central ulceration. Lesion measures at least 4 cm in length and results in a long segment fixed stricture. Findings are highly suspicious for esophageal carcinoma and further evaluation with endoscopy and tissue sampling is recommended. 2. Esophageal dysmotility. These results will be called to the ordering clinician or representative by the Radiologist Assistant, and communication documented in the PACS or zVision Dashboard. Electronically Signed   By: Davina Poke M.D.   On: 12/07/2018 10:36      ASSESSMENT & PLAN:  1. Goals of care, counseling/discussion   2. Malignant neoplasm of lower third of esophagus (HCC)   3. Beta thalassemia minor    #The diagnosis of esophageal cancer and care plan were discussed with patient in detail.   Recommend patient to proceed with PET scan for staging.   Given patient's advanced age, poor performance status, multiple medical comorbidities, I  do not think he is a good candidate for surgery even if his esophageal cancer is localized or aggressive chemotherapy regimens. The goal of treatment which is to palliate disease, disease related symptoms, improve quality of life and hopefully prolong life was highlighted in our discussion.  Recommend palliative radiation to control his symptoms. We can check Omniseq testing to see if patient would qualify for tachycardia therapy.  Immunotherapy may have a role  Refer to RadOnc for evaluation of pallaitive RT.  Refer to palliative care to establish care.   # microcytic anemia, chronic, beta thalassemia minor Supportive care measures are necessary for patient well-being and will be provided as necessary. We spent sufficient time to discuss many aspect of  care, questions were answered to patient's satisfaction.   Orders Placed This Encounter  Procedures   NM PET Image Initial (PI) Skull Base To Thigh    Standing Status:   Future    Standing Expiration Date:   12/30/2019    Order Specific Question:   If indicated for the ordered procedure, I authorize the administration of a radiopharmaceutical per Radiology protocol    Answer:   Yes    Order Specific Question:   Preferred imaging location?    Answer:   [facility]    Order Specific Question:   Radiology Contrast Protocol - do NOT remove file path    Answer:   [email or web address]   CBC with Differential/Platelet    Standing Status:   Future    Standing Expiration Date:   12/30/2019   Comprehensive metabolic panel    Standing Status:   Future    Standing Expiration Date:   12/30/2019   CEA    Standing Status:   Future    Standing Expiration Date:   12/30/2019   Ambulatory referral to Radiation Oncology    Referral Priority:   Routine    Referral Type:   Consultation    Referral Reason:   Specialty Services Required    Referred to Provider:   Noreene Filbert, MD    Requested Specialty:   Radiation  Oncology    Number of Visits Requested:   1    All questions were answered. The patient knows to call the clinic with any problems questions or concerns.  cc McLean-Scocuzza, Olivia Mackie *    Return of visit: after PET.  We spent sufficient time to discuss many aspect of care, questions were answered to patient's satisfaction. Total face to face encounter time for this patient visit was 40 min. >50% of the time was  spent in counseling and coordination of care.    Earlie Server, MD, PhD Hematology Oncology [facility] at [facility] Pager- [phone] 12/31/2018

## 2019-01-02 NOTE — Progress Notes (Signed)
Omniseq gene profile requisition sent to pathology with confirmation of receipt. Specimen collected 12/23/2018, esophageal biopsy, [phone]. HER-2 requested as add on test.

## 2019-01-05 ENCOUNTER — Encounter: Payer: Self-pay | Admitting: Oncology

## 2019-01-05 DIAGNOSIS — Z7189 Other specified counseling: Secondary | ICD-10-CM | POA: Insufficient documentation

## 2019-01-09 ENCOUNTER — Other Ambulatory Visit: Payer: Self-pay

## 2019-01-09 ENCOUNTER — Encounter
Admission: RE | Admit: 2019-01-09 | Discharge: 2019-01-09 | Disposition: A | Discharge status: Home / Self Care | Payer: Medicare Other | Source: Ambulatory Visit | Attending: Oncology | Admitting: Oncology

## 2019-01-09 DIAGNOSIS — C155 Malignant neoplasm of lower third of esophagus: Secondary | ICD-10-CM | POA: Diagnosis not present

## 2019-01-09 DIAGNOSIS — N400 Enlarged prostate without lower urinary tract symptoms: Secondary | ICD-10-CM | POA: Insufficient documentation

## 2019-01-09 DIAGNOSIS — I700 Atherosclerosis of aorta: Secondary | ICD-10-CM | POA: Insufficient documentation

## 2019-01-09 DIAGNOSIS — I2510 Atherosclerotic heart disease of native coronary artery without angina pectoris: Secondary | ICD-10-CM | POA: Insufficient documentation

## 2019-01-09 DIAGNOSIS — C159 Malignant neoplasm of esophagus, unspecified: Secondary | ICD-10-CM | POA: Diagnosis not present

## 2019-01-09 DIAGNOSIS — N3289 Other specified disorders of bladder: Secondary | ICD-10-CM | POA: Diagnosis not present

## 2019-01-09 LAB — GLUCOSE, CAPILLARY: Glucose-Capillary: 97 mg/dL (ref 70–99)

## 2019-01-09 MED ORDER — FLUDEOXYGLUCOSE F - 18 (FDG) INJECTION
9.2100 | Freq: Once | INTRAVENOUS | Status: AC | PRN
  Administered 2019-01-09: 10:00:00 9.21 via INTRAVENOUS

## 2019-01-11 ENCOUNTER — Encounter: Payer: Self-pay | Admitting: Oncology

## 2019-01-11 ENCOUNTER — Inpatient Hospital Stay: Payer: Medicare Other | Attending: Oncology

## 2019-01-11 ENCOUNTER — Encounter: Payer: Self-pay | Admitting: Radiation Oncology

## 2019-01-11 ENCOUNTER — Inpatient Hospital Stay (HOSPITAL_BASED_OUTPATIENT_CLINIC_OR_DEPARTMENT_OTHER): Payer: Medicare Other | Admitting: Hospice and Palliative Medicine

## 2019-01-11 ENCOUNTER — Inpatient Hospital Stay (HOSPITAL_BASED_OUTPATIENT_CLINIC_OR_DEPARTMENT_OTHER): Payer: Medicare Other | Admitting: Oncology

## 2019-01-11 ENCOUNTER — Ambulatory Visit
Admission: RE | Admit: 2019-01-11 | Discharge: 2019-01-11 | Disposition: A | Discharge status: Home / Self Care | Payer: Medicare Other | Source: Ambulatory Visit | Attending: Radiation Oncology | Admitting: Radiation Oncology

## 2019-01-11 ENCOUNTER — Other Ambulatory Visit: Payer: Self-pay

## 2019-01-11 VITALS — BP 100/50 | HR 66 | Temp 96.9°F | Resp 18

## 2019-01-11 VITALS — BP 90/51 | HR 62 | Temp 96.7°F

## 2019-01-11 DIAGNOSIS — C155 Malignant neoplasm of lower third of esophagus: Secondary | ICD-10-CM | POA: Diagnosis not present

## 2019-01-11 DIAGNOSIS — G893 Neoplasm related pain (acute) (chronic): Secondary | ICD-10-CM | POA: Diagnosis not present

## 2019-01-11 DIAGNOSIS — C159 Malignant neoplasm of esophagus, unspecified: Secondary | ICD-10-CM

## 2019-01-11 DIAGNOSIS — N179 Acute kidney failure, unspecified: Secondary | ICD-10-CM

## 2019-01-11 DIAGNOSIS — Z66 Do not resuscitate: Secondary | ICD-10-CM | POA: Insufficient documentation

## 2019-01-11 DIAGNOSIS — Z7189 Other specified counseling: Secondary | ICD-10-CM

## 2019-01-11 DIAGNOSIS — Z515 Encounter for palliative care: Secondary | ICD-10-CM | POA: Diagnosis not present

## 2019-01-11 DIAGNOSIS — Z87891 Personal history of nicotine dependence: Secondary | ICD-10-CM | POA: Diagnosis not present

## 2019-01-11 DIAGNOSIS — E46 Unspecified protein-calorie malnutrition: Secondary | ICD-10-CM | POA: Insufficient documentation

## 2019-01-11 DIAGNOSIS — R1310 Dysphagia, unspecified: Secondary | ICD-10-CM | POA: Diagnosis not present

## 2019-01-11 HISTORY — DX: Acute kidney failure, unspecified: N17.9

## 2019-01-11 LAB — COMPREHENSIVE METABOLIC PANEL
ALT: 16 U/L (ref 0–44)
AST: 26 U/L (ref 15–41)
Albumin: 3.3 g/dL — ABNORMAL LOW (ref 3.5–5.0)
Alkaline Phosphatase: 80 U/L (ref 38–126)
Anion gap: 8 (ref 5–15)
BUN: 26 mg/dL — ABNORMAL HIGH (ref 8–23)
CO2: 27 mmol/L (ref 22–32)
Calcium: 9.1 mg/dL (ref 8.9–10.3)
Chloride: 101 mmol/L (ref 98–111)
Creatinine, Ser: 1.9 mg/dL — ABNORMAL HIGH (ref 0.61–1.24)
GFR calc Af Amer: 35 mL/min — ABNORMAL LOW (ref >60)
GFR calc non Af Amer: 31 mL/min — ABNORMAL LOW (ref >60)
Glucose, Bld: 134 mg/dL — ABNORMAL HIGH (ref 70–99)
Potassium: 3.9 mmol/L (ref 3.5–5.1)
Sodium: 136 mmol/L (ref 135–145)
Total Bilirubin: 0.5 mg/dL (ref 0.3–1.2)
Total Protein: 7 g/dL (ref 6.5–8.1)

## 2019-01-11 LAB — CBC WITH DIFFERENTIAL/PLATELET
Abs Immature Granulocytes: 0.01 10*3/uL (ref 0.00–0.07)
Basophils Absolute: 0 10*3/uL (ref 0.0–0.1)
Basophils Relative: 1 %
Eosinophils Absolute: 0 10*3/uL (ref 0.0–0.5)
Eosinophils Relative: 1 %
HCT: 30.7 % — ABNORMAL LOW (ref 39.0–52.0)
Hemoglobin: 9.9 g/dL — ABNORMAL LOW (ref 13.0–17.0)
Immature Granulocytes: 0 %
Lymphocytes Relative: 27 %
Lymphs Abs: 1.1 10*3/uL (ref 0.7–4.0)
MCH: 24.1 pg — ABNORMAL LOW (ref 26.0–34.0)
MCHC: 32.2 g/dL (ref 30.0–36.0)
MCV: 74.9 fL — ABNORMAL LOW (ref 80.0–100.0)
Monocytes Absolute: 0.4 10*3/uL (ref 0.1–1.0)
Monocytes Relative: 11 %
Neutro Abs: 2.4 10*3/uL (ref 1.7–7.7)
Neutrophils Relative %: 60 %
Platelets: 207 10*3/uL (ref 150–400)
RBC: 4.1 MIL/uL — ABNORMAL LOW (ref 4.22–5.81)
RDW: 15 % (ref 11.5–15.5)
WBC: 3.9 10*3/uL — ABNORMAL LOW (ref 4.0–10.5)
nRBC: 0 % (ref 0.0–0.2)

## 2019-01-11 MED ORDER — MORPHINE SULFATE (CONCENTRATE) 10 MG /0.5 ML PO SOLN: 5.0000 mg | Freq: Four times a day (QID) | ORAL | 0 refills | Status: AC | PRN

## 2019-01-11 NOTE — Consult Note (Signed)
NEW PATIENT EVALUATION  Name: Lucas Jackson  MRN: [id number]  Date:   01/11/2019     DOB: 1927-08-11   This 85 y.o. 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  REFERRING PHYSICIAN: Earlie Server, MD  CHIEF COMPLAINT:  Chief Complaint  Patient presents with  . Esophageal Cancer    Initial Eval    DIAGNOSIS: There were no encounter diagnoses.   PREVIOUS INVESTIGATIONS:  PET CT scan reviewed Pathology report reviewed Clinical notes reviewed  HPI: Patient is an 85 year old male resident of nursing home who has presented with gradually increasing dysphasia.  Swallowing study in September showed masslike filling defect within the distal esophagus with suggestion of central ulceration.  Lesion measured least 4 cm in length.  He had a hemoglobin of 7.  Upper endoscopy on 918 showed large fungating and ulcerating mass in the lower third of the esophagus 30 to 35 cm from the incisors.  Pathology was positive for invasive poorly differentiated squamous cell carcinoma.  PET/CT demonstrated hypermetabolic distal esophageal mass with hypermetabolic metastatic lymph nodes in the chest and upper abdomen.  Patient is seen today for consideration of palliative radiation therapy.  He states he has been having some dysphasia.  He is also be considered for hospice care.  PLANNED TREATMENT REGIMEN: Recommendation for palliative radiation therapy  PAST MEDICAL HISTORY:  has a past medical history of Diabetes mellitus without complication ([place]), Hyperlipidemia, Hypertension, and Osteomyelitis ([place]).    PAST SURGICAL HISTORY:  Past Surgical History:  Procedure Laterality Date  . AMPUTATION TOE Right 07/26/2018   Procedure: AMPUTATION TOE 2ND TOE RIGHT FOOT;  Surgeon: Albertine Patricia, DPM;  Location: [facility] ORS;  Service: Podiatry;  Laterality: Right;  . APPENDECTOMY     1985  . BRAIN SURGERY  1961   removed bone from  inside skull that was pressing on optic nerve  . ESOPHAGOGASTRODUODENOSCOPY (EGD) WITH PROPOFOL N/A 12/23/2018   Procedure: ESOPHAGOGASTRODUODENOSCOPY (EGD) WITH PROPOFOL;  Surgeon: Jonathon Bellows, MD;  Location: [facility] ENDOSCOPY;  Service: Gastroenterology;  Laterality: N/A;  Urgent  . HERNIA REPAIR     umbilical [id number]   . LOWER EXTREMITY ANGIOGRAPHY Right 07/27/2018   Procedure: Lower Extremity Angiography possible intervention;  Surgeon: Katha Cabal, MD;  Location: [place] CV LAB;  Service: Cardiovascular;  Laterality: Right;  . TOE AMPUTATION     right great toe 2016    FAMILY HISTORY: family history includes Cancer in his brother; Colon cancer in his brother; Heart disease in his daughter; Stroke in his brother.  SOCIAL HISTORY:  reports that he quit smoking about 59 years ago. He has never used smokeless tobacco. He reports that he does not drink alcohol or use drugs.  ALLERGIES: 5-alpha reductase inhibitors  MEDICATIONS:  Current Outpatient Medications  Medication Sig Dispense Refill  . acetaminophen (TYLENOL) 500 MG tablet Take 1,000 mg by mouth 2 (two) times daily.    Marland Kitchen amLODipine (NORVASC) 2.5 MG tablet Take 1 tablet (2.5 mg total) by mouth daily. 90 tablet 3  . aspirin EC 81 MG tablet Take 1 tablet (81 mg total) by mouth daily. 90 tablet 1  . carvedilol (COREG) 25 MG tablet Take 1 tablet (25 mg total) by mouth 2 (two) times daily. 180 tablet 3  . chlorthalidone (HYGROTON) 25 MG tablet Take 1 tablet (25 mg total) by mouth daily. (Patient taking differently: Take 12.5 mg by mouth daily. ) 90 tablet 3  .  ciprofloxacin (CIPRO) 250 MG tablet     . diclofenac sodium (VOLTAREN) 1 % GEL Apply 2 g topically 4 (four) times daily. B/l knees. Can use up to 4 grams 100 g 12  . ferrous sulfate 325 (65 FE) MG tablet Take 325 mg by mouth daily with breakfast.    . magnesium hydroxide (MILK OF MAGNESIA) 400 MG/5ML suspension Take 30 mLs by mouth daily as needed for mild constipation.    .  Menthol, Topical Analgesic, (BIOFREEZE EX) Apply topically. Shoulders    . metFORMIN (GLUCOPHAGE) 500 MG tablet Take 1 tablet (500 mg total) by mouth daily with breakfast. 90 tablet 3  . Multiple Vitamin (MULTIVITAMIN WITH MINERALS) TABS tablet Take 1 tablet by mouth daily.    . Omega-3 Fatty Acids (FISH OIL) 1000 MG CAPS Take 1 capsule (1,000 mg total) by mouth 2 (two) times daily. 180 capsule 3  . Omega-3 Fatty Acids (FISH OIL) 1000 MG CAPS Take 1,000 mg by mouth 2 (two) times a day.    . pantoprazole (PROTONIX) 40 MG tablet Take 1 tablet (40 mg total) by mouth daily. 30 min before breakfast 90 tablet 3  . sennosides-docusate sodium (SENOKOT-S) 8.6-50 MG tablet Take 1 tablet by mouth 2 (two) times a day.    . sulfamethoxazole-trimethoprim (BACTRIM DS) 800-160 MG tablet Take 1 tablet by mouth 2 (two) times daily. With food 14 tablet 0  . telmisartan (MICARDIS) 80 MG tablet Take 1 tablet (80 mg total) by mouth daily. 90 tablet 3  . traMADol (ULTRAM) 50 MG tablet Take 1 tablet (50 mg total) by mouth 2 (two) times daily as needed. (Patient taking differently: Take 50 mg by mouth 2 (two) times daily as needed. Bid prn and has qd tramadol scheduled) 10 tablet 0  . tuberculin (APLISOL) 5 UNIT/0.1ML injection Inject into the skin.     No current facility-administered medications for this encounter.     ECOG PERFORMANCE STATUS:  2 - Symptomatic, <50% confined to bed  REVIEW OF SYSTEMS: Patient denies any weight loss, fatigue, weakness, fever, chills or night sweats. Patient denies any loss of vision, blurred vision. Patient denies any ringing  of the ears or hearing loss. No irregular heartbeat. Patient denies heart murmur or history of fainting. Patient denies any chest pain or pain radiating to her upper extremities. Patient denies any shortness of breath, difficulty breathing at night, cough or hemoptysis. Patient denies any swelling in the lower legs. Patient denies any nausea vomiting, vomiting of  blood, or coffee ground material in the vomitus. Patient denies any stomach pain. Patient states has had normal bowel movements no significant constipation or diarrhea. Patient denies any dysuria, hematuria or significant nocturia. Patient denies any problems walking, swelling in the joints or loss of balance. Patient denies any skin changes, loss of hair or loss of weight. Patient denies any excessive worrying or anxiety or significant depression. Patient denies any problems with insomnia. Patient denies excessive thirst, polyuria, polydipsia. Patient denies any swollen glands, patient denies easy bruising or easy bleeding. Patient denies any recent infections, allergies or URI. Patient "s visual fields have not changed significantly in recent time.   PHYSICAL EXAM: BP (!) 100/50   Pulse 66   Temp (!) 96.9 F (36.1 C)   Resp 18  Wheelchair-bound frail-appearing male in NAD.  Well-developed well-nourished patient in NAD. HEENT reveals PERLA, EOMI, discs not visualized.  Oral cavity is clear. No oral mucosal lesions are identified. Neck is clear without evidence of cervical or  supraclavicular adenopathy. Lungs are clear to A&P. Cardiac examination is essentially unremarkable with regular rate and rhythm without murmur rub or thrill. Abdomen is benign with no organomegaly or masses noted. Motor sensory and DTR levels are equal and symmetric in the upper and lower extremities. Cranial nerves II through XII are grossly intact. Proprioception is intact. No peripheral adenopathy or edema is identified. No motor or sensory levels are noted. Crude visual fields are within normal range.  LABORATORY DATA: Pathology report reviewed    RADIOLOGY RESULTS: PET CT scan reviewed compatible with above-stated findings   IMPRESSION: Stage III squamous cell carcinoma the distal esophagus in 85 year old male  PLAN: Based on the patient's age and overall general status I would recommend a palliative course of radiation  therapy over 5 weeks.  I will plan on delivering 5040 cGy in 28 fractions.  I would use IMRT treatment planning and delivery and incorporate the hypermetabolic lymph nodes in my treatment field.  Risks and benefits of treatment occluding possible increased dysphasia secondary to radiation esophagitis fatigue alteration of blood count skin reaction all were discussed in detail with the patient.  I will also be contacting the patient's niece who makes recommendations on his health care.  Patient is also being seen by medical oncology today and palliative care and they may choose hospice care.  I would like to take this opportunity to thank you for allowing me to participate in the care of your patient.Noreene Filbert, MD

## 2019-01-11 NOTE — Progress Notes (Signed)
[place]  [phone] [phone]   Name: Lucas Jackson Date: 01/11/2019 MRN: [id number]  DOB: November 02, 1927  Patient Care Team: McLean-Scocuzza, Nino Glow, MD as PCP - General (Internal Medicine) Clent Jacks, RN as Oncology Nurse Navigator    REASON FOR CONSULTATION: Palliative Care consult requested for this 85 y.o. male with multiple medical problems including stage III esophageal carcinoma with plan to initiate XRT.  Patient is a resident of SNF with underlying poor performance status and is not felt to be a candidate for surgery or chemotherapy.  Palliative care was consulted to address goals and manage ongoing symptoms.  SOCIAL HISTORY:     reports that he quit smoking about 59 years ago. He has never used smokeless tobacco. He reports that he does not drink alcohol or use drugs.   Patient is a widower.  He has a daughter in [place], a son in [place], and another son in prison.  Patient has a niece who lives nearby and is involved in his care.  Patient also has several stepchildren.  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  Patient is a resident at [facility].  ADVANCE DIRECTIVES:  Does not have  CODE STATUS: DNR (MOST Form completed on 01/11/2019)  PAST MEDICAL HISTORY: Past Medical History:  Diagnosis Date  . Diabetes mellitus without complication ([place])   . Hyperlipidemia   . Hypertension   . Osteomyelitis ([place])     PAST SURGICAL HISTORY:  Past Surgical History:  Procedure Laterality Date  . AMPUTATION TOE Right 07/26/2018   Procedure: AMPUTATION TOE 2ND TOE RIGHT FOOT;  Surgeon: Albertine Patricia, DPM;  Location: [facility] ORS;  Service: Podiatry;  Laterality: Right;  . APPENDECTOMY     1985  . BRAIN SURGERY  1961   removed bone from inside skull that was pressing on optic nerve  . ESOPHAGOGASTRODUODENOSCOPY (EGD) WITH PROPOFOL N/A  12/23/2018   Procedure: ESOPHAGOGASTRODUODENOSCOPY (EGD) WITH PROPOFOL;  Surgeon: Jonathon Bellows, MD;  Location: [facility] ENDOSCOPY;  Service: Gastroenterology;  Laterality: N/A;  Urgent  . HERNIA REPAIR     umbilical 8413   . LOWER EXTREMITY ANGIOGRAPHY Right 07/27/2018   Procedure: Lower Extremity Angiography possible intervention;  Surgeon: Katha Cabal, MD;  Location: [place] CV LAB;  Service: Cardiovascular;  Laterality: Right;  . TOE AMPUTATION     right great toe 2016    HEMATOLOGY/ONCOLOGY HISTORY:  Oncology History Overview Note  Invasive poorly differentiated squamous cell carcinoma, esophagus     ALLERGIES:  is allergic to 5-alpha reductase inhibitors.  MEDICATIONS:  Current Outpatient Medications  Medication Sig Dispense Refill  . acetaminophen (TYLENOL) 500 MG tablet Take 1,000 mg by mouth 2 (two) times daily.    Marland Kitchen amLODipine (NORVASC) 2.5 MG tablet Take 1 tablet (2.5 mg total) by mouth daily. 90 tablet 3  . aspirin EC 81 MG tablet Take 1 tablet (81 mg total) by mouth daily. 90 tablet 1  . carvedilol (COREG) 25 MG tablet Take 1 tablet (25 mg total) by mouth 2 (two) times daily. 180 tablet 3  . chlorthalidone (HYGROTON) 25 MG tablet Take 1 tablet (25 mg total) by mouth daily. (Patient taking differently: Take 12.5 mg by mouth daily. ) 90 tablet 3  . ciprofloxacin (CIPRO) 250 MG tablet     . diclofenac sodium (VOLTAREN) 1 % GEL Apply 2 g topically 4 (four) times daily. B/l knees. Can  use up to 4 grams 100 g 12  . ferrous sulfate 325 (65 FE) MG tablet Take 325 mg by mouth daily with breakfast.    . magnesium hydroxide (MILK OF MAGNESIA) 400 MG/5ML suspension Take 30 mLs by mouth daily as needed for mild constipation.    . Menthol, Topical Analgesic, (BIOFREEZE EX) Apply topically. Shoulders    . metFORMIN (GLUCOPHAGE) 500 MG tablet Take 1 tablet (500 mg total) by mouth daily with breakfast. 90 tablet 3  . Morphine Sulfate (MORPHINE CONCENTRATE) 10 mg / 0.5 ml concentrated  solution Take 0.25 mLs (5 mg total) by mouth every 6 (six) hours as needed for severe pain. 30 mL 0  . Multiple Vitamin (MULTIVITAMIN WITH MINERALS) TABS tablet Take 1 tablet by mouth daily.    . Omega-3 Fatty Acids (FISH OIL) 1000 MG CAPS Take 1 capsule (1,000 mg total) by mouth 2 (two) times daily. 180 capsule 3  . Omega-3 Fatty Acids (FISH OIL) 1000 MG CAPS Take 1,000 mg by mouth 2 (two) times a day.    . pantoprazole (PROTONIX) 40 MG tablet Take 1 tablet (40 mg total) by mouth daily. 30 min before breakfast 90 tablet 3  . sennosides-docusate sodium (SENOKOT-S) 8.6-50 MG tablet Take 1 tablet by mouth 2 (two) times a day.    . sulfamethoxazole-trimethoprim (BACTRIM DS) 800-160 MG tablet Take 1 tablet by mouth 2 (two) times daily. With food 14 tablet 0  . telmisartan (MICARDIS) 80 MG tablet Take 1 tablet (80 mg total) by mouth daily. 90 tablet 3  . traMADol (ULTRAM) 50 MG tablet Take 1 tablet (50 mg total) by mouth 2 (two) times daily as needed. (Patient taking differently: Take 50 mg by mouth 2 (two) times daily as needed. Bid prn and has qd tramadol scheduled) 10 tablet 0  . tuberculin (APLISOL) 5 UNIT/0.1ML injection Inject into the skin.     No current facility-administered medications for this visit.     VITAL SIGNS: There were no vitals taken for this visit. There were no vitals filed for this visit.  Estimated body mass index is 23.19 kg/m as calculated from the following:   Height as of 12/23/18: 6' (1.829 m).   Weight as of 12/23/18: 171 lb (77.6 kg).  LABS: CBC:    Component Value Date/Time   WBC 3.9 (L) 01/11/2019 1259   HGB 9.9 (L) 01/11/2019 1259   HGB 7.0 (LL) 12/08/2018 1348   HCT 30.7 (L) 01/11/2019 1259   HCT 20.7 (L) 12/08/2018 1348   PLT 207 01/11/2019 1259   PLT 75 (LL) 12/08/2018 1348   MCV 74.9 (L) 01/11/2019 1259   MCV 72 (L) 12/08/2018 1348   MCV 74 (L) 07/07/2014 0418   NEUTROABS 2.4 01/11/2019 1259   NEUTROABS 2.3 12/08/2018 1348   NEUTROABS 2.7  07/07/2014 0418   LYMPHSABS 1.1 01/11/2019 1259   LYMPHSABS 0.7 12/08/2018 1348   LYMPHSABS 1.5 07/07/2014 0418   MONOABS 0.4 01/11/2019 1259   MONOABS 0.4 07/07/2014 0418   EOSABS 0.0 01/11/2019 1259   EOSABS 0.0 12/08/2018 1348   EOSABS 0.1 07/07/2014 0418   BASOSABS 0.0 01/11/2019 1259   BASOSABS 0.0 12/08/2018 1348   BASOSABS 0.0 07/07/2014 0418   Comprehensive Metabolic Panel:    Component Value Date/Time   NA 136 01/11/2019 1259   NA 138 07/07/2014 0418   K 3.9 01/11/2019 1259   K 3.4 (L) 07/07/2014 0418   CL 101 01/11/2019 1259   CL 105 07/07/2014 0418   CO2  27 01/11/2019 1259   CO2 27 07/07/2014 0418   BUN 26 (H) 01/11/2019 1259   BUN 19 07/07/2014 0418   CREATININE 1.90 (H) 01/11/2019 1259   CREATININE 1.28 (H) 07/08/2014 0422   GLUCOSE 134 (H) 01/11/2019 1259   GLUCOSE 98 07/07/2014 0418   CALCIUM 9.1 01/11/2019 1259   CALCIUM 7.8 (L) 07/07/2014 0418   AST 26 01/11/2019 1259   AST 28 07/04/2014 0521   ALT 16 01/11/2019 1259   ALT 20 07/04/2014 0521   ALKPHOS 80 01/11/2019 1259   ALKPHOS 55 07/04/2014 0521   BILITOT 0.5 01/11/2019 1259   BILITOT 0.5 07/04/2014 0521   PROT 7.0 01/11/2019 1259   PROT 6.4 (L) 07/04/2014 0521   ALBUMIN 3.3 (L) 01/11/2019 1259   ALBUMIN 2.7 (L) 07/04/2014 0521    RADIOGRAPHIC STUDIES: Nm Pet Image Initial (pi) Skull Base To Thigh  Result Date: 01/09/2019 CLINICAL DATA:  Initial treatment strategy for esophageal cancer. EXAM: NUCLEAR MEDICINE PET SKULL BASE TO THIGH TECHNIQUE: 9.2 mCi F-18 FDG was injected intravenously. Full-ring PET imaging was performed from the skull base to thigh after the radiotracer. CT data was obtained and used for attenuation correction and anatomic localization. Fasting blood glucose: 97 mg/dl COMPARISON:  Esophagram 12/07/2018. FINDINGS: Mediastinal blood pool activity: SUV max 3.0 Liver activity: SUV max NA NECK: No abnormal hypermetabolism. Incidental CT findings: None. CHEST: Hypermetabolic left  paratracheal lymph node measures 7 mm (3/91) with an SUV max of 6 1. Mid left periesophageal lymph node measures 6 mm (3/106) with an SUV max of 7.7. No hypermetabolic hilar or axillary lymph nodes. Intensely hypermetabolic distal esophageal mass has an SUV max of 42.6. Incidental CT findings: There are calcified mediastinal and hilar lymph nodes. Atherosclerotic calcification of the aorta, aortic valve and coronary arteries. Heart is enlarged. No pericardial or pleural effusion. No worrisome pulmonary nodules. ABDOMEN/PELVIS: Hypermetabolic gastrohepatic ligament adenopathy measures up to 2.5 x 3.2 cm (3/153) with an SUV max 24.3. A hypermetabolic lymph node posterior to the IVC measures 5 mm (3/164) with an SUV max 14.7. No abnormal hypermetabolism in the liver, adrenal glands, spleen or pancreas. Incidental CT findings: Subcentimeter low-attenuation lesion in the right hepatic lobe is too small to characterize. Liver, gallbladder and adrenal glands are otherwise unremarkable. 2.3 cm low-attenuation lesion in the interpolar right kidney is likely a cyst. Kidneys, spleen, pancreas, stomach and bowel are otherwise unremarkable. Atherosclerotic calcification of the aorta. Bladder trabeculation with a right-sided diverticulum. Prostate is enlarged. SKELETON: No abnormal osseous hypermetabolism. Incidental CT findings: Degenerative changes in the spine. A lipoma along the posterior lower left neck measures 4.4 x 9.1 cm. IMPRESSION: 1. Intensely hypermetabolic distal esophageal mass with hypermetabolic metastatic lymph nodes in the chest and upper abdomen. 2. Enlarged prostate. Bladder trabeculation is indicative of outlet obstruction. 3. Aortic atherosclerosis (ICD10-170.0). Coronary artery calcification. Electronically Signed   By: Lorin Picket M.D.   On: 01/09/2019 13:25    PERFORMANCE STATUS (ECOG) : 3 - Symptomatic, >50% confined to bed  Review of Systems Unless otherwise noted, a complete review of  systems is negative.  Physical Exam General: NAD, frail appearing, thin, in wheelchair Pulmonary: Unlabored Extremities: no edema, no joint deformities Skin: no rashes Neurological: Weakness but otherwise nonfocal  IMPRESSION: I met with patient and his niece, Jocelyn Lamer.  Dr. Tasia Catchings also spoke with him today.  Plan is to initiate radiation to esophageal lesion and to complete staging work-up with MRI of the brain.  Patient confirms a desire to pursue  treatment.  However, he says that when it is his time, he is ready to die.  Patient is a resident at [facility], where he has lived since May 2020.  At baseline, patient says that he can ambulate on occasion with his walker but is mostly in the wheelchair and dependent upon staff for assistance.  Patient reports persistent dysphagia.  He finds softer foods are easier to swallow.  He describes the facility having recently modified his diet.  Patient does have esophageal pain but denies odynophagia. He says pain averages 8 out of 10.  Will prescribe morphine elixir PRN.   Patient does not have ACP documents although we discussed them in detail today.  He would want his daughter to be his decision maker if necessary.  I completed a MOST form today. The patient and family outlined their wishes for the following treatment decisions:  Cardiopulmonary Resuscitation: Do Not Attempt Resuscitation (DNR/No CPR)  Medical Interventions: Limited Additional Interventions: Use medical treatment, IV fluids and cardiac monitoring as indicated, DO NOT USE intubation or mechanical ventilation. May consider use of less invasive airway support such as BiPAP or CPAP. Also provide comfort measures. Transfer to the hospital if indicated. Avoid intensive care.   Antibiotics: Antibiotics if indicated  IV Fluids: IV fluids if indicated  Feeding Tube: Feeding tube long-term if indicated    PLAN: -Continue current scope of treatment -Morphine elixir 76m PO Q6H PRN for pain -DNR/DNI  (MOST form completed) -Palliative care consult at SNF -RTC in 2 weeks   Patient expressed understanding and was in agreement with this plan. He also understands that He can call the clinic at any time with any questions, concerns, or complaints.     Time Total: 30 minutes  Visit consisted of counseling and education dealing with the complex and emotionally intense issues of symptom management and palliative care in the setting of serious and potentially life-threatening illness.Greater than 50%  of this time was spent counseling and coordinating care related to the above assessment and plan.  Signed by: JAltha Harm PhD, NP-C [phone](Work Cell)

## 2019-01-11 NOTE — Progress Notes (Signed)
Patient is here with his niece to discuss PET scan.

## 2019-01-11 NOTE — Progress Notes (Signed)
Hematology/Oncology  Follow up note [facility] [phone] [phone]   Patient Care Team: McLean-Scocuzza, Nino Glow, MD as PCP - General (Internal Medicine) Clent Jacks, RN as Oncology Nurse Navigator  REFERRING PROVIDER: McLean-Scocuzza, Olivia Mackie *  CHIEF COMPLAINTS/REASON FOR VISIT:  Follow up for discussion of new cancer diagnosis, anemia and thrombocytopenia.  HISTORY OF PRESENTING ILLNESS:   Lucas Jackson is a  85 y.o.  male with PMH listed below was seen in consultation at the request of  McLean-Scocuzza, Olivia Mackie *  for evaluation of anemia and thrombocytopenia. Patient lives in a nursing home facility.  He has experienced progressively worsening dysphagia and was evaluated by gastroenterology Dr. Vicente Males.  Barium swallow study 12/07/2018 demonstrated an irregular masslike filling defect within the distal esophagus with suggestion of central ulceration.  Lesion measures at least 4 cm in length and results and long segment fixed stricture.  And was recommended EGD for further evaluation. Preop blood work showed acute drop of hemoglobin to 7 and platelet count dropped to 75,000. Patient was referred to hematology for further evaluation. He has an appointment last week and was no-show. Today his facility sent patient to our wound clinic first before sending him to our clinic  Patient denies feeling weak and fatigued.  Denies any weight loss.  Endorses ongoing dysphagia.  Denies any pain. Denies any acute bleeding events. # S/p EGD on 12/23/2018 for work-up of dysphagia. EGD showed large fungating and ulcerating mass with no bleeding, in the lower third of esophagus.  30 to 35 cm from incisors.  Partially obstructing and partially circumferential.  Biopsy was taken. Pathology showed invasive poorly differentiated squamous cell carcinoma.  INTERVAL HISTORY Lucas Jackson is a 85 y.o. male who has above history reviewed by me today presents  for follow up visit for discussion of cancer diagnosis.  Problems and complaints are listed below: Continues to have dysphagia. Able to eat semi solids.  Poor historian, sometimes information he provided is inconsistent Substernal pain, worsen with swallowing, 8/10 now, intermittent. He can not remember is tylenol is helping his pain. Or not.  Feels weak.  Accompanied by niece.    Review of Systems  Constitutional: Positive for appetite change and fatigue. Negative for chills, fever and unexpected weight change.  HENT:   Negative for hearing loss and voice change.   Eyes: Negative for eye problems and icterus.  Respiratory: Negative for chest tightness, cough and shortness of breath.   Cardiovascular: Negative for chest pain and leg swelling.  Gastrointestinal: Negative for abdominal distention and abdominal pain.       Substernal pain.  Endocrine: Negative for hot flashes.  Genitourinary: Negative for difficulty urinating, dysuria and frequency.   Musculoskeletal: Negative for arthralgias.  Skin: Negative for itching and rash.  Neurological: Negative for light-headedness and numbness.  Hematological: Negative for adenopathy. Does not bruise/bleed easily.  Psychiatric/Behavioral: Negative for confusion.    MEDICAL HISTORY:  Past Medical History:  Diagnosis Date   Diabetes mellitus without complication ([place])    Hyperlipidemia    Hypertension    Osteomyelitis ([place])     SURGICAL HISTORY: Past Surgical History:  Procedure Laterality Date   AMPUTATION TOE Right 07/26/2018   Procedure: AMPUTATION TOE 2ND TOE RIGHT FOOT;  Surgeon: Albertine Patricia, DPM;  Location: [facility] ORS;  Service: Podiatry;  Laterality: Right;   [place]   removed bone from inside skull that was pressing on optic  nerve   ESOPHAGOGASTRODUODENOSCOPY (EGD) WITH PROPOFOL N/A 12/23/2018   Procedure: ESOPHAGOGASTRODUODENOSCOPY (EGD) WITH PROPOFOL;  Surgeon: Jonathon Bellows, MD;   Location: [facility];  Service: Gastroenterology;  Laterality: N/A;  Urgent   HERNIA REPAIR     umbilical [id number]    LOWER EXTREMITY ANGIOGRAPHY Right 07/27/2018   Procedure: Lower Extremity Angiography possible intervention;  Surgeon: Katha Cabal, MD;  Location: [facility];  Service: Cardiovascular;  Laterality: Right;   TOE AMPUTATION     right great toe 2016    SOCIAL HISTORY: Social History   Socioeconomic History   Marital status: Widowed    Spouse name: Not on file   Number of children: 3   Years of education: Not on file   Highest education level: Not on file  Occupational History   Not on file  Social Needs   Financial resource strain: Not on file   Food insecurity    Worry: Not on file    Inability: Not on file   Transportation needs    Medical: Not on file    Non-medical: Not on file  Tobacco Use   Smoking status: Former Smoker    Quit date: 1961    Years since quitting: 59.8   Smokeless tobacco: Never Used  Substance and Sexual Activity   Alcohol use: No   Drug use: Never   Sexual activity: Not Currently  Lifestyle   Physical activity    Days per week: Not on file    Minutes per session: Not on file   Stress: Not on file  Relationships   Social connections    Talks on phone: Not on file    Gets together: Not on file    Attends religious service: Not on file    Active member of club or organization: Not on file    Attends meetings of clubs or organizations: Not on file    Relationship status: Not on file   Intimate partner violence    Fear of current or ex partner: Not on file    Emotionally abused: Not on file    Physically abused: Not on file    Forced sexual activity: Not on file  Other Topics Concern   Not on file  Social History Narrative   3 kids    Niece Peggyann [place] [id number] involved in care    Owns guns, wears seat belts, safe in relationship   Retired, widower   Some high school ed    Administrator    Lives at home with 2 relatives     FAMILY HISTORY: Family History  Problem Relation Age of Onset   Colon cancer Brother    Cancer Brother        colon    Stroke Brother    Heart disease Daughter        MI    ALLERGIES:  is allergic to 5-alpha reductase inhibitors.  MEDICATIONS:  Current Outpatient Medications  Medication Sig Dispense Refill   acetaminophen (TYLENOL) 500 MG tablet Take 1,000 mg by mouth 2 (two) times daily.     amLODipine (NORVASC) 2.5 MG tablet Take 1 tablet (2.5 mg total) by mouth daily. 90 tablet 3   aspirin EC 81 MG tablet Take 1 tablet (81 mg total) by mouth daily. 90 tablet 1   carvedilol (COREG) 25 MG tablet Take 1 tablet (25 mg total) by mouth 2 (two) times daily. 180 tablet 3   chlorthalidone (HYGROTON) 25 MG tablet  Take 1 tablet (25 mg total) by mouth daily. (Patient taking differently: Take 12.5 mg by mouth daily. ) 90 tablet 3   diclofenac sodium (VOLTAREN) 1 % GEL Apply 2 g topically 4 (four) times daily. B/l knees. Can use up to 4 grams 100 g 12   ferrous sulfate 325 (65 FE) MG tablet Take 325 mg by mouth daily with breakfast.     magnesium hydroxide (MILK OF MAGNESIA) 400 MG/5ML suspension Take 30 mLs by mouth daily as needed for mild constipation.     Menthol, Topical Analgesic, (BIOFREEZE EX) Apply topically. Shoulders     metFORMIN (GLUCOPHAGE) 500 MG tablet Take 1 tablet (500 mg total) by mouth daily with breakfast. 90 tablet 3   Multiple Vitamin (MULTIVITAMIN WITH MINERALS) TABS tablet Take 1 tablet by mouth daily.     Omega-3 Fatty Acids (FISH OIL) 1000 MG CAPS Take 1 capsule (1,000 mg total) by mouth 2 (two) times daily. 180 capsule 3   Omega-3 Fatty Acids (FISH OIL) 1000 MG CAPS Take 1,000 mg by mouth 2 (two) times a day.     pantoprazole (PROTONIX) 40 MG tablet Take 1 tablet (40 mg total) by mouth daily. 30 min before breakfast 90 tablet 3   sennosides-docusate sodium (SENOKOT-S) 8.6-50 MG tablet Take 1  tablet by mouth 2 (two) times a day.     sulfamethoxazole-trimethoprim (BACTRIM DS) 800-160 MG tablet Take 1 tablet by mouth 2 (two) times daily. With food 14 tablet 0   telmisartan (MICARDIS) 80 MG tablet Take 1 tablet (80 mg total) by mouth daily. 90 tablet 3   traMADol (ULTRAM) 50 MG tablet Take 1 tablet (50 mg total) by mouth 2 (two) times daily as needed. (Patient taking differently: Take 50 mg by mouth 2 (two) times daily as needed. Bid prn and has qd tramadol scheduled) 10 tablet 0   tuberculin (APLISOL) 5 UNIT/0.1ML injection Inject into the skin.     ciprofloxacin (CIPRO) 250 MG tablet      Morphine Sulfate (MORPHINE CONCENTRATE) 10 mg / 0.5 ml concentrated solution Take 0.25 mLs (5 mg total) by mouth every 6 (six) hours as needed for severe pain. 30 mL 0   No current facility-administered medications for this visit.      PHYSICAL EXAMINATION: ECOG PERFORMANCE STATUS: 3 - Symptomatic, >50% confined to bed Vitals:   01/11/19 1401  BP: (!) 90/51  Pulse: 62  Temp: (!) 96.7 F (35.9 C)   There were no vitals filed for this visit.  Physical Exam Constitutional:      General: He is not in acute distress.    Comments: Frail appearance, sitting in wheelchair.  HENT:     Head: Normocephalic and atraumatic.  Eyes:     General: No scleral icterus.    Pupils: Pupils are equal, round, and reactive to light.  Neck:     Musculoskeletal: Normal range of motion and neck supple.  Cardiovascular:     Rate and Rhythm: Normal rate and regular rhythm.     Heart sounds: Normal heart sounds.  Pulmonary:     Effort: Pulmonary effort is normal. No respiratory distress.     Breath sounds: No wheezing.  Abdominal:     General: Bowel sounds are normal. There is no distension.     Palpations: Abdomen is soft. There is no mass.     Tenderness: There is no abdominal tenderness.  Musculoskeletal: Normal range of motion.        General: No deformity.  Skin:  General: Skin is warm and  dry.     Findings: No erythema or rash.  Neurological:     Mental Status: He is alert and oriented to person, place, and time.     Cranial Nerves: No cranial nerve deficit.     Coordination: Coordination normal.  Psychiatric:        Behavior: Behavior normal.        Thought Content: Thought content normal.     LABORATORY DATA:  I have reviewed the data as listed Lab Results  Component Value Date   WBC 3.9 (L) 01/11/2019   HGB 9.9 (L) 01/11/2019   HCT 30.7 (L) 01/11/2019   MCV 74.9 (L) 01/11/2019   PLT 207 01/11/2019   Recent Labs    07/24/18 1612  07/29/18 0440 12/22/18 1158 01/11/19 1259  NA 135   < > 140 137 136  K 4.0   < > 3.9 4.1 3.9  CL 99   < > 107 102 101  CO2 25   < > 25 26 27   GLUCOSE 146*   < > 89 118* 134*  BUN 44*   < > 15 23 26*  CREATININE 1.68*   < > 1.01 1.24 1.90*  CALCIUM 9.0   < > 8.0* 9.2 9.1  GFRNONAA 36*   < > >60 51* 31*  GFRAA 41*   < > >60 59* 35*  PROT 8.5*  --   --  7.3 7.0  ALBUMIN 3.1*  --   --  3.3* 3.3*  AST 25  --   --  23 26  ALT 19  --   --  15 16  ALKPHOS 85  --   --  70 80  BILITOT 1.0  --   --  0.6 0.5   < > = values in this interval not displayed.   Iron/TIBC/Ferritin/ %Sat    Component Value Date/Time   IRON 30 (L) 12/08/2018 1348   TIBC 293 12/08/2018 1348   FERRITIN 116 12/08/2018 1348   IRONPCTSAT 10 (L) 12/08/2018 1348   IRONPCTSAT 26 08/05/2017 1601      RADIOGRAPHIC STUDIES: I have personally reviewed the radiological images as listed and agreed with the findings in the report.  Nm Pet Image Initial (pi) Skull Base To Thigh  Result Date: 01/09/2019 CLINICAL DATA:  Initial treatment strategy for esophageal cancer. EXAM: NUCLEAR MEDICINE PET SKULL BASE TO THIGH TECHNIQUE: 9.2 mCi F-18 FDG was injected intravenously. Full-ring PET imaging was performed from the skull base to thigh after the radiotracer. CT data was obtained and used for attenuation correction and anatomic localization. Fasting blood glucose: 97  mg/dl COMPARISON:  Esophagram 12/07/2018. FINDINGS: Mediastinal blood pool activity: SUV max 3.0 Liver activity: SUV max NA NECK: No abnormal hypermetabolism. Incidental CT findings: None. CHEST: Hypermetabolic left paratracheal lymph node measures 7 mm (3/91) with an SUV max of 6 1. Mid left periesophageal lymph node measures 6 mm (3/106) with an SUV max of 7.7. No hypermetabolic hilar or axillary lymph nodes. Intensely hypermetabolic distal esophageal mass has an SUV max of 42.6. Incidental CT findings: There are calcified mediastinal and hilar lymph nodes. Atherosclerotic calcification of the aorta, aortic valve and coronary arteries. Heart is enlarged. No pericardial or pleural effusion. No worrisome pulmonary nodules. ABDOMEN/PELVIS: Hypermetabolic gastrohepatic ligament adenopathy measures up to 2.5 x 3.2 cm (3/153) with an SUV max 24.3. A hypermetabolic lymph node posterior to the IVC measures 5 mm (3/164) with an SUV max 14.7. No abnormal hypermetabolism  in the liver, adrenal glands, spleen or pancreas. Incidental CT findings: Subcentimeter low-attenuation lesion in the right hepatic lobe is too small to characterize. Liver, gallbladder and adrenal glands are otherwise unremarkable. 2.3 cm low-attenuation lesion in the interpolar right kidney is likely a cyst. Kidneys, spleen, pancreas, stomach and bowel are otherwise unremarkable. Atherosclerotic calcification of the aorta. Bladder trabeculation with a right-sided diverticulum. Prostate is enlarged. SKELETON: No abnormal osseous hypermetabolism. Incidental CT findings: Degenerative changes in the spine. A lipoma along the posterior lower left neck measures 4.4 x 9.1 cm. IMPRESSION: 1. Intensely hypermetabolic distal esophageal mass with hypermetabolic metastatic lymph nodes in the chest and upper abdomen. 2. Enlarged prostate. Bladder trabeculation is indicative of outlet obstruction. 3. Aortic atherosclerosis (ICD10-170.0). Coronary artery calcification.  Electronically Signed   By: Lorin Picket M.D.   On: 01/09/2019 13:25      ASSESSMENT & PLAN:  1. Malignant neoplasm of lower third of esophagus (HCC)   2. Goals of care, counseling/discussion   3. AKI (acute kidney injury) ([place])   4. Protein-calorie malnutrition, unspecified severity ([place])   5. Acute renal failure, unspecified acute renal failure type ([place])   6. Neoplasm related pain    #The diagnosis of esophageal cancer and care plan were discussed with patient in detail.   Clinical TX N2 M0.   PET scan showed locally advanced esophageal cancer.  Arrange MRI brain to complete staging. Considering patient's advanced age, poor performance status, multiple medical problems, I do not think he is a good candidate for surgery, or aggressive chemotherapies. Goal of care is to palliate disease, disease related symptoms, improve quality of life and hopefully prolonging his life. Patient has been referred to radiation oncology and has establish care with Dr. Baruch Gouty Patient will start palliative radiation in the near future. Prognosis is poor, discussed with patient and his niece. We will continue to support patient. Repeat CBC and CMP.  #CMP was reviewed.  Acute kidney failure with a creatinine of 1.9.  Likely secondary to dehydration. Will arrange patient to receive IV fluid.  #Dysphagia, currently he is able to take medications and semisolid.  Will close monitor.  If he developed more severe dysphagia, will need to consider PEG tube. Refer to dietitian. # pain due to neoplasm, will start low dose liquid morphine 5mg  Q6 hours as needed.  # microcytic anemia, chronic, beta thalassemia minor Supportive care measures are necessary for patient well-being and will be provided as necessary. We spent sufficient time to discuss many aspect of care, questions were answered to patient's satisfaction.   Orders Placed This Encounter  Procedures   MR Brain W Wo Contrast    Standing Status:    Future    Standing Expiration Date:   01/11/2020    Order Specific Question:   ** REASON FOR EXAM (FREE TEXT)    Answer:   esophageal cancer    Order Specific Question:   If indicated for the ordered procedure, I authorize the administration of contrast media per Radiology protocol    Answer:   Yes    Order Specific Question:   What is the patient's sedation requirement?    Answer:   No Sedation    Order Specific Question:   Does the patient have a pacemaker or implanted devices?    Answer:   No    Order Specific Question:   Use SRS Protocol?    Answer:   Yes    Order Specific Question:   Radiology Contrast Protocol - do  NOT remove file path    Answer:   [email or web address]    Order Specific Question:   Preferred imaging location?    Answer:   [facility] (table limit-400lbs)   CBC with Differential/Platelet    Standing Status:   Future    Standing Expiration Date:   01/11/2020   Comprehensive metabolic panel    Standing Status:   Future    Standing Expiration Date:   01/11/2020   Amb Referral to Nutrition and Diabetic E    Referral Priority:   Routine    Referral Type:   Consultation    Referral Reason:   Specialty Services Required    Number of Visits Requested:   1    All questions were answered. The patient knows to call the clinic with any problems questions or concerns.  cc McLean-Scocuzza, Olivia Mackie *    Return of visit: 2 weeks. We spent sufficient time to discuss many aspect of care, questions were answered to patient's satisfaction. Total face to face encounter time for this patient visit was 40 min. >50% of the time was  spent in counseling and coordination of care.     Earlie Server, MD, PhD Hematology Oncology [facility] Pager- [phone] 01/11/2019

## 2019-01-12 ENCOUNTER — Telehealth: Payer: Self-pay

## 2019-01-12 ENCOUNTER — Inpatient Hospital Stay: Payer: Medicare Other

## 2019-01-12 ENCOUNTER — Ambulatory Visit: Payer: Medicare Other

## 2019-01-12 LAB — CEA: CEA: 3.3 ng/mL (ref 0.0–4.7)

## 2019-01-12 NOTE — Telephone Encounter (Signed)
-----   Message from Earlie Server, MD sent at 01/11/2019 11:25 PM EDT ----- He has developed AKI/dehydration.  Please check if he can get IVF normal saline 541ml over 1 hour at cancer center or nursing home.  Repeat BMP on 10/12 and possible IVF again.  Thank you.

## 2019-01-12 NOTE — Telephone Encounter (Signed)
The facility states they were not aware of any appts patient had here, appt info was provided after visits yesterday.  They were not able to get him here today for appts so radiation and IVF have been scheduled for tomorrow.

## 2019-01-12 NOTE — Telephone Encounter (Signed)
Ellison Hughs C., scheduler, is working on getting him on infusion schedule at 11:15 today.  He will come upstairs after his radiation appt.

## 2019-01-13 ENCOUNTER — Inpatient Hospital Stay: Payer: Medicare Other

## 2019-01-13 ENCOUNTER — Ambulatory Visit: Payer: Medicare Other

## 2019-01-13 ENCOUNTER — Telehealth: Payer: Self-pay

## 2019-01-13 NOTE — Telephone Encounter (Signed)
[name] in radiation called facility they said patient refused to come today because he sat in the cold for 4 hours the other day.  Lucas Jackson is getting his radiation appt r/s to Tuesday @ 1:00.  We will check for chair availability for IVF.

## 2019-01-13 NOTE — Telephone Encounter (Signed)
Brenda from [facility] called to cancel all his 01/13/19 scheduled appts for today.  She asked that you please give them a call back to get him R/S his appt for IVF.

## 2019-01-13 NOTE — Telephone Encounter (Signed)
Patient scheduled for IVF Tuesday (01/17/19) for IVF.

## 2019-01-16 ENCOUNTER — Other Ambulatory Visit: Payer: Self-pay

## 2019-01-17 ENCOUNTER — Encounter: Payer: Self-pay | Admitting: Internal Medicine

## 2019-01-17 ENCOUNTER — Ambulatory Visit: Payer: Medicare Other

## 2019-01-17 ENCOUNTER — Inpatient Hospital Stay: Payer: Medicare Other

## 2019-01-17 ENCOUNTER — Other Ambulatory Visit: Payer: Self-pay

## 2019-01-17 DIAGNOSIS — Z1159 Encounter for screening for other viral diseases: Secondary | ICD-10-CM | POA: Diagnosis not present

## 2019-01-18 ENCOUNTER — Other Ambulatory Visit: Payer: Self-pay

## 2019-01-18 ENCOUNTER — Non-Acute Institutional Stay: Payer: Medicare Other | Admitting: Primary Care

## 2019-01-18 DIAGNOSIS — Z515 Encounter for palliative care: Secondary | ICD-10-CM | POA: Diagnosis not present

## 2019-01-18 NOTE — Progress Notes (Signed)
[facility] Consult Note Telephone: [phone]  Fax: [phone]  TELEHEALTH VISIT STATEMENT Due to the COVID-19 crisis, this visit was done via telemedicine from my office. It was initiated and consented to by this patient and/or family.  PATIENT NAME: Lucas Jackson [place] [address] [phone] (home)  DOB: [birth year] MRN: [id number]  PRIMARY CARE PROVIDER:  . Dr. Juluis Pitch REFERRING PROVIDER: Dr. Juluis Pitch  RESPONSIBLE PARTY:   Extended Emergency Contact Information Primary Emergency Contact: Peggyann Shoals Mobile Phone: [phone] Relation: Niece Secondary Emergency Contact: snipes, [place] Mobile Phone: [phone] Relation: Neighbor   ASSESSMENT AND RECOMMENDATIONS:   1. Advance Care Planning/Goals of Care: Goals include to maximize quality of life and symptom management. Recently changed his MOST to request DNR, limited interventions, use of antibiotics, IV fluids and feeding tube. I have zoom planned with family tomorrow am at 85 and will discuss rapid decline, and probable end of life based on SNF staff report.  2. Symptom Management:   Disease Process: Much debilitated now. Stated last week that he himself wanted hospice services. He is now unable to hear or speak,  which happened as an event suddenly over the weekend. Staff states he is in decline and seems to be moving towards end of life. Has poor intake but is eating and drinking still.   Sudden hearing loss: Recommend to have ST assess him urgently for a hearing loss vs altered mental status.   Nutrition: Poor, not eating much of his diet. No gross difficulty with swallowing on exam. However he is exhibition anorexia.   Radiation plan:  Reviewed with cancer center. He had planned to begin radiation treatments but may not be able to tolerate this activity at this point. This will be reviewed with family tomorrow in light of his  decline.   Pain: Has some pain and using prns at times. Appears controlled at this time. Has scheduled ultram, prn ultram and prn roxanol. Recommend to give 2.5-5 mg roxanol q 4 hrs for pain if uncontrolled.   Recommend review for hospice referral if he plans to cancel radiation plans. He may be too debilitated to travel to these treatments.   3. Family /Caregiver/Community Supports:  Extended family in area and out of area, living in LTC currently,  Has not visited with family at window recently. Staff will suggest to  move him to private room for family to visit more easily.   4. Cognitive / Functional decline: Sudden hearing loss, loss of speech. Needs help with all adls, cannot feed self.  5. Follow up Palliative Care Visit: Palliative care will continue to follow for goals of care clarification and symptom management. Return 1-2 weeks or prn.  I spent 25 minutes providing this consultation,  from 1500 to 1525. More than 50% of the time in this consultation was spent coordinating communication.   HISTORY OF PRESENT ILLNESS:  Lucas Jackson is a 85 y.o. year old male with multiple medical problems including DM, OA, esophageal cancer, sudden hearing loss, AMS. Palliative Care was asked to follow this patient by consultation request of Dr. Lovie Macadamia to help address advance care planning and goals of care. This is the initial visit.  CODE STATUS: DNR, limited interventions, use of antibiotics, IV fluids and feeding tube.   PPS: 30% (weak) HOSPICE ELIGIBILITY/DIAGNOSIS: yes/esophageal cancer  PAST MEDICAL HISTORY:  Past Medical History:  Diagnosis Date  . Acute kidney failure ([facility]) 01/11/2019  . Diabetes mellitus  without complication ([facility])   . Hyperlipidemia   . Hypertension   . Osteomyelitis ([facility])     SOCIAL HX:  Social History   Tobacco Use  . Smoking status: Former Smoker    Quit date: 1961    Years since quitting: 59.8  . Smokeless tobacco: Never Used  Substance Use Topics   . Alcohol use: No    ALLERGIES:  Allergies  Allergen Reactions  . 5-Alpha Reductase Inhibitors      Medications:  Outpatient Encounter Medications as of 01/18/2019  Medication Sig  . amLODipine (NORVASC) 2.5 MG tablet Take 1 tablet (2.5 mg total) by mouth daily.  Marland Kitchen aspirin EC 81 MG tablet Take 1 tablet (81 mg total) by mouth daily.  . carvedilol (COREG) 25 MG tablet Take 1 tablet (25 mg total) by mouth 2 (two) times daily.  . chlorthalidone (HYGROTON) 25 MG tablet Take 1 tablet (25 mg total) by mouth daily. (Patient taking differently: Take 12.5 mg by mouth daily. )  . diclofenac sodium (VOLTAREN) 1 % GEL Apply 2 g topically 4 (four) times daily. B/l knees. Can use up to 4 grams  . ferrous sulfate 325 (65 FE) MG tablet Take 325 mg by mouth daily with breakfast.  . magnesium hydroxide (MILK OF MAGNESIA) 400 MG/5ML suspension Take 30 mLs by mouth daily as needed for mild constipation.  . Menthol, Topical Analgesic, (BIOFREEZE EX) Apply topically. Shoulders  . metFORMIN (GLUCOPHAGE) 500 MG tablet Take 1 tablet (500 mg total) by mouth daily with breakfast.  . Morphine Sulfate (MORPHINE CONCENTRATE) 10 mg / 0.5 ml concentrated solution Take 0.25 mLs (5 mg total) by mouth every 6 (six) hours as needed for severe pain.  . Multiple Vitamin (MULTIVITAMIN WITH MINERALS) TABS tablet Take 1 tablet by mouth daily.  . Omega-3 Fatty Acids (FISH OIL) 1000 MG CAPS Take 1 capsule (1,000 mg total) by mouth 2 (two) times daily.  . pantoprazole (PROTONIX) 40 MG tablet Take 1 tablet (40 mg total) by mouth daily. 30 min before breakfast  . sennosides-docusate sodium (SENOKOT-S) 8.6-50 MG tablet Take 1 tablet by mouth 2 (two) times a day.  . sucralfate (CARAFATE) 1 GM/10ML suspension Take 1 g by mouth 4 (four) times daily -  with meals and at bedtime.  Marland Kitchen telmisartan (MICARDIS) 80 MG tablet Take 1 tablet (80 mg total) by mouth daily.  . traMADol (ULTRAM) 50 MG tablet Take 50 mg by mouth 2 (two) times daily. PRN  pain  . acetaminophen (TYLENOL) 500 MG tablet Take 1,000 mg by mouth 2 (two) times daily.  . ciprofloxacin (CIPRO) 250 MG tablet   . Omega-3 Fatty Acids (FISH OIL) 1000 MG CAPS Take 1,000 mg by mouth 2 (two) times a day.  . sulfamethoxazole-trimethoprim (BACTRIM DS) 800-160 MG tablet Take 1 tablet by mouth 2 (two) times daily. With food (Patient not taking: Reported on 01/18/2019)  . tuberculin (APLISOL) 5 UNIT/0.1ML injection Inject into the skin.   No facility-administered encounter medications on file as of 01/18/2019.    PHYSICAL EXAM / ROS:   Current and past weights: Ht 6' , Wt 170 lbs,  BMI 23.19 pre hospital record in mid Sept.  Late Sept 177 lb at [facility]. Need updated weight. General: NAD, frail appearing, thin  Cardiovascular: no chest pain reported, no edema Pulmonary: no cough, no increased SOB, room air Abdomen: appetite very poor, denies constipation, incontinent of bowel GU: denies dysuria, incontinent of urine MSK:  no joint deformities, non ambulatory Skin: no rashes or  wounds reported Neurological: Weakness, sudden total hearing loss,  AMS as of the weekend. Also blind at baseline.  Cyndia Skeeters DNP AGPCNP-BC

## 2019-01-19 ENCOUNTER — Ambulatory Visit: Payer: Medicare Other

## 2019-01-19 ENCOUNTER — Telehealth: Payer: Self-pay | Admitting: Oncology

## 2019-01-19 ENCOUNTER — Other Ambulatory Visit: Payer: Self-pay

## 2019-01-19 ENCOUNTER — Non-Acute Institutional Stay: Payer: Medicare Other | Admitting: Primary Care

## 2019-01-19 DIAGNOSIS — I10 Essential (primary) hypertension: Secondary | ICD-10-CM | POA: Diagnosis not present

## 2019-01-19 DIAGNOSIS — N390 Urinary tract infection, site not specified: Secondary | ICD-10-CM | POA: Diagnosis not present

## 2019-01-19 DIAGNOSIS — Z515 Encounter for palliative care: Secondary | ICD-10-CM | POA: Diagnosis not present

## 2019-01-19 DIAGNOSIS — M6281 Muscle weakness (generalized): Secondary | ICD-10-CM | POA: Diagnosis not present

## 2019-01-20 ENCOUNTER — Encounter: Payer: Self-pay | Admitting: Internal Medicine

## 2019-01-23 ENCOUNTER — Inpatient Hospital Stay: Payer: Medicare Other

## 2019-01-23 ENCOUNTER — Ambulatory Visit: Payer: Medicare Other

## 2019-01-25 ENCOUNTER — Ambulatory Visit: Payer: Medicare Other

## 2019-01-25 ENCOUNTER — Encounter: Payer: Medicare Other | Admitting: Hospice and Palliative Medicine

## 2019-01-25 ENCOUNTER — Ambulatory Visit: Payer: Medicare Other | Admitting: Oncology

## 2019-01-25 ENCOUNTER — Other Ambulatory Visit: Payer: Medicare Other

## 2019-01-26 ENCOUNTER — Ambulatory Visit: Payer: Medicare Other

## 2019-01-27 ENCOUNTER — Ambulatory Visit: Payer: Medicare Other

## 2019-01-30 ENCOUNTER — Ambulatory Visit: Payer: Medicare Other

## 2019-01-30 DIAGNOSIS — M6281 Muscle weakness (generalized): Secondary | ICD-10-CM | POA: Diagnosis not present

## 2019-01-30 DIAGNOSIS — S91309A Unspecified open wound, unspecified foot, initial encounter: Secondary | ICD-10-CM | POA: Diagnosis not present

## 2019-01-30 DIAGNOSIS — Z4781 Encounter for orthopedic aftercare following surgical amputation: Secondary | ICD-10-CM | POA: Diagnosis not present

## 2019-01-30 DIAGNOSIS — M869 Osteomyelitis, unspecified: Secondary | ICD-10-CM | POA: Diagnosis not present

## 2019-01-31 ENCOUNTER — Ambulatory Visit: Payer: Medicare Other

## 2019-02-01 ENCOUNTER — Ambulatory Visit: Payer: Medicare Other

## 2019-02-02 ENCOUNTER — Ambulatory Visit: Payer: Medicare Other

## 2019-02-03 ENCOUNTER — Ambulatory Visit: Payer: Medicare Other

## 2019-02-05 NOTE — Telephone Encounter (Signed)
Patient's daughter Zacharry Lackland called and requested call back.  I called her and updated her patient's condition.  Per Nevin Bloodgood, family is interested in proceeding with comfort care and hospice. I think it is reasonable.  Nevin Bloodgood has discussed with palliative care and will call them to initiate hospice.

## 2019-02-05 NOTE — Progress Notes (Signed)
[facility] Consult Note Telephone: [phone]  Fax: [phone]  TELEHEALTH VISIT STATEMENT Due to the COVID-19 crisis, this visit was done via telemedicine from my office. It was initiated and consented to by this patient and/or family.  PATIENT NAME: Lucas Jackson [place] [address] [phone] (home)  DOB: 08-21-1927 MRN: [id number]  PRIMARY CARE PROVIDER:  Juluis Jackson REFERRING PROVIDER: Juluis Jackson  RESPONSIBLE PARTY:   Extended Emergency Contact Information Primary Emergency Contact: Lucas Jackson Address: [address] [place] [address] Mobile Phone: [phone] Relation: Son Secondary Emergency Contact: Lucas Jackson Mobile Phone: [phone] Relation: Niece   ASSESSMENT AND RECOMMENDATIONS:   1. Advance Care Planning/Goals of Care: Goals include to maximize quality of life and symptom management. I met with son, daughter-in-law  and niece today by zoom. They had questions regarding Lucas Jackson plan of care and prognosis. We discussed his disease progression and upcoming plan for palliative radiation. We discussed his recent change apparently in hearing and discussed its being further evaluated. I told him I would had recommended the speech evaluation as they could assist with hearing and cognition. We discussed his disease trajectory and given in the worsening of his disease , he could be appropriate for end-of-life care.  I shared with them patient's recent change and Code status to do not resuscitate as well as the conversation that he had with staff asking for hospice. Son Lucas Jackson was going to make a plan to perhaps accompany the patient on his doctor appointments on the 21st Oct.   I told them that his rapid decline had led me to think that the radiation may not be of as much benefit as thought initially. They are going to consider as a family and make some  decisions. I asked them to give me a call if I could help in anyway.   2. Symptom Management: Ongoing pain needs cited by family.  3. Family /Caregiver/Community Supports: Spoke on zoom with daughter in law, son and niece.  4. Cognitive / Functional decline:  Reported to have decline per SNF staff. Scheduled for appts for radiation prep this afternoon and next week.  5. Follow up Palliative Care Visit: Palliative care will continue to follow for goals of care clarification and symptom management. Return 1 weeks or prn. Patient is appropriate for hospice services or end of life care management if they choose not to pursue this radiation protocol.  I spent 40 minutes providing this consultation,  from 0930  to 1010. More than 50% of the time in this consultation was spent coordinating communication.   HISTORY OF PRESENT ILLNESS:  Lucas Jackson is a 85 y.o. year old male with multiple medical problems including esophageal cancer, pain, hearing loss. Palliative Care was asked to follow this patient by consultation request of Lucas Jackson to help address advance care planning and goals of care. This is a follow up visit.  CODE STATUS: DNR  PPS: 20% HOSPICE ELIGIBILITY/DIAGNOSIS: tbd/metastatic esophageal cancer  PAST MEDICAL HISTORY:  Past Medical History:  Diagnosis Date  . Acute kidney failure ([facility]) 01/11/2019  . Diabetes mellitus without complication ([facility])   . Hyperlipidemia   . Hypertension   . Osteomyelitis ([place])     SOCIAL HX:  Social History   Tobacco Use  . Smoking status: Former Smoker    Quit date: 1961    Years since quitting: 59.8  . Smokeless  tobacco: Never Used  Substance Use Topics  . Alcohol use: No    ALLERGIES:  Allergies  Allergen Reactions  . 5-Alpha Reductase Inhibitors      PERTINENT MEDICATIONS:  Outpatient Encounter Medications as of January 22, 2019  Medication Sig  . acetaminophen (TYLENOL) 500 MG tablet Take 1,000 mg by mouth 2 (two) times daily.   Marland Kitchen amLODipine (NORVASC) 2.5 MG tablet Take 1 tablet (2.5 mg total) by mouth daily.  Marland Kitchen aspirin EC 81 MG tablet Take 1 tablet (81 mg total) by mouth daily.  . carvedilol (COREG) 25 MG tablet Take 1 tablet (25 mg total) by mouth 2 (two) times daily.  . chlorthalidone (HYGROTON) 25 MG tablet Take 1 tablet (25 mg total) by mouth daily. (Patient taking differently: Take 12.5 mg by mouth daily. )  . ciprofloxacin (CIPRO) 250 MG tablet   . diclofenac sodium (VOLTAREN) 1 % GEL Apply 2 g topically 4 (four) times daily. B/l knees. Can use up to 4 grams  . ferrous sulfate 325 (65 FE) MG tablet Take 325 mg by mouth daily with breakfast.  . magnesium hydroxide (MILK OF MAGNESIA) 400 MG/5ML suspension Take 30 mLs by mouth daily as needed for mild constipation.  . Menthol, Topical Analgesic, (BIOFREEZE EX) Apply topically. Shoulders  . metFORMIN (GLUCOPHAGE) 500 MG tablet Take 1 tablet (500 mg total) by mouth daily with breakfast.  . Morphine Sulfate (MORPHINE CONCENTRATE) 10 mg / 0.5 ml concentrated solution Take 0.25 mLs (5 mg total) by mouth every 6 (six) hours as needed for severe pain.  . Multiple Vitamin (MULTIVITAMIN WITH MINERALS) TABS tablet Take 1 tablet by mouth daily.  . Omega-3 Fatty Acids (FISH OIL) 1000 MG CAPS Take 1 capsule (1,000 mg total) by mouth 2 (two) times daily.  . Omega-3 Fatty Acids (FISH OIL) 1000 MG CAPS Take 1,000 mg by mouth 2 (two) times a day.  . pantoprazole (PROTONIX) 40 MG tablet Take 1 tablet (40 mg total) by mouth daily. 30 min before breakfast  . sennosides-docusate sodium (SENOKOT-S) 8.6-50 MG tablet Take 1 tablet by mouth 2 (two) times a day.  . sucralfate (CARAFATE) 1 GM/10ML suspension Take 1 g by mouth 4 (four) times daily -  with meals and at bedtime.  . sulfamethoxazole-trimethoprim (BACTRIM DS) 800-160 MG tablet Take 1 tablet by mouth 2 (two) times daily. With food (Patient not taking: Reported on 01/18/2019)  . telmisartan (MICARDIS) 80 MG tablet Take 1 tablet (80 mg  total) by mouth daily.  . traMADol (ULTRAM) 50 MG tablet Take 50 mg by mouth 2 (two) times daily. PRN pain  . tuberculin (APLISOL) 5 UNIT/0.1ML injection Inject into the skin.   No facility-administered encounter medications on file as of 2019-01-22.     Cyndia Skeeters DNP AGPCNP-BC

## 2019-02-05 NOTE — Telephone Encounter (Signed)
Verbal order called to hospice for services at SNF.

## 2019-02-05 DEATH — deceased

## 2019-02-06 ENCOUNTER — Ambulatory Visit: Payer: Medicare Other

## 2019-02-07 ENCOUNTER — Ambulatory Visit: Payer: Medicare Other

## 2019-02-08 ENCOUNTER — Ambulatory Visit: Payer: Medicare Other

## 2019-02-09 ENCOUNTER — Ambulatory Visit: Payer: Medicare Other

## 2019-02-10 ENCOUNTER — Ambulatory Visit: Payer: Medicare Other

## 2019-02-10 ENCOUNTER — Encounter: Payer: Medicare Other | Admitting: Hospice and Palliative Medicine

## 2019-02-10 ENCOUNTER — Ambulatory Visit: Payer: Medicare Other | Admitting: Oncology

## 2019-02-10 ENCOUNTER — Other Ambulatory Visit: Payer: Medicare Other

## 2019-02-13 ENCOUNTER — Ambulatory Visit: Payer: Medicare Other

## 2019-02-14 ENCOUNTER — Ambulatory Visit: Payer: Medicare Other

## 2019-02-15 ENCOUNTER — Ambulatory Visit: Payer: Medicare Other

## 2019-02-16 ENCOUNTER — Ambulatory Visit: Payer: Medicare Other

## 2019-02-17 ENCOUNTER — Ambulatory Visit: Payer: Medicare Other

## 2019-02-20 ENCOUNTER — Ambulatory Visit: Payer: Medicare Other

## 2019-02-21 ENCOUNTER — Ambulatory Visit: Payer: Medicare Other

## 2019-02-22 ENCOUNTER — Ambulatory Visit: Payer: Medicare Other

## 2019-02-23 ENCOUNTER — Ambulatory Visit: Payer: Medicare Other

## 2019-02-24 ENCOUNTER — Ambulatory Visit: Payer: Medicare Other

## 2019-02-27 ENCOUNTER — Ambulatory Visit: Payer: Medicare Other

## 2019-02-28 ENCOUNTER — Ambulatory Visit: Payer: Medicare Other

## 2019-03-01 ENCOUNTER — Ambulatory Visit: Payer: Medicare Other

## 2019-03-06 ENCOUNTER — Ambulatory Visit: Payer: Medicare Other

## 2019-03-07 ENCOUNTER — Ambulatory Visit: Payer: Medicare Other

## 2019-03-08 ENCOUNTER — Ambulatory Visit: Payer: Medicare Other

## 2019-03-09 ENCOUNTER — Ambulatory Visit: Payer: Medicare Other

## 2019-03-10 ENCOUNTER — Ambulatory Visit: Payer: Medicare Other

## 2019-04-14 ENCOUNTER — Ambulatory Visit: Payer: Medicare Other | Admitting: Internal Medicine

## 2019-06-06 ENCOUNTER — Ambulatory Visit (INDEPENDENT_AMBULATORY_CARE_PROVIDER_SITE_OTHER): Payer: Medicare Other | Admitting: Vascular Surgery

## 2019-06-06 ENCOUNTER — Encounter (INDEPENDENT_AMBULATORY_CARE_PROVIDER_SITE_OTHER): Payer: Medicare Other

## 2020-09-12 IMAGING — MR MRI OF THE RIGHT FOREFOOT WITHOUT CONTRAST
5 series · 40 of 40 positions shown · non-contrast
Comparison: Right foot x-rays dated July 24, 2018.

CLINICAL DATA: Right second toe ulcer.

EXAM:
MRI OF THE RIGHT FOREFOOT WITHOUT CONTRAST
TECHNIQUE: Multiplanar, multisequence MR imaging of the right forefoot was
performed. No intravenous contrast was administered.

[Series 6: T2 · coronal · right · 3.0mm · 0.38mm/px · 12 of 45 slices shown (1 of 2)]
[im 1/45]
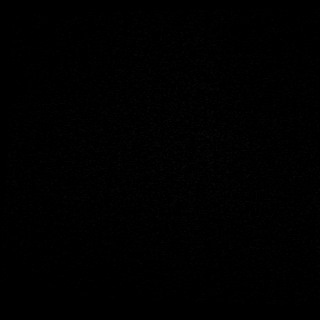
[im 5/45]
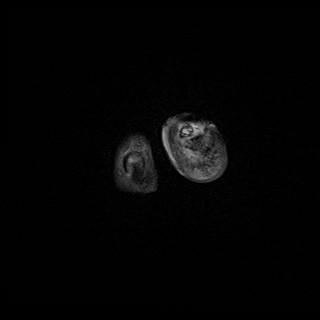
[im 9/45]
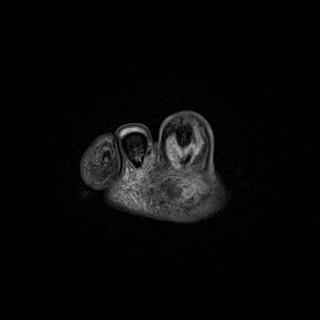
[im 13/45]
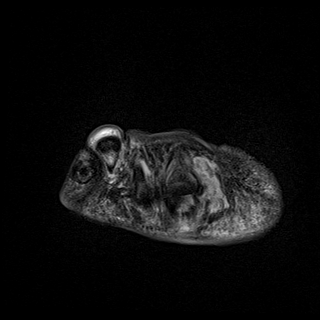
[im 17/45]
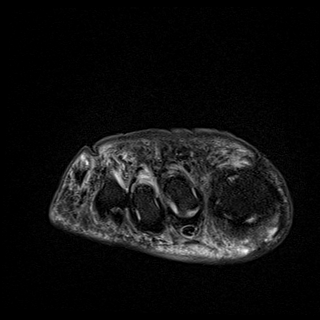
[im 21/45]
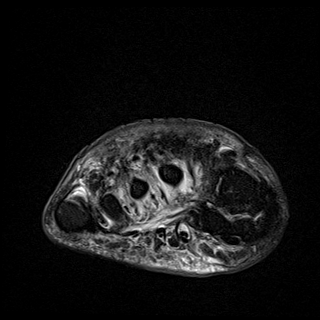
[im 25/45]
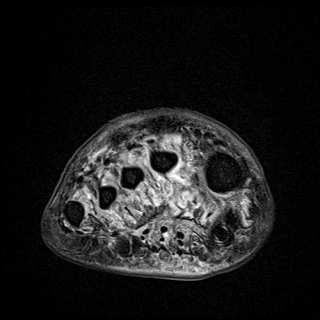
[im 29/45]
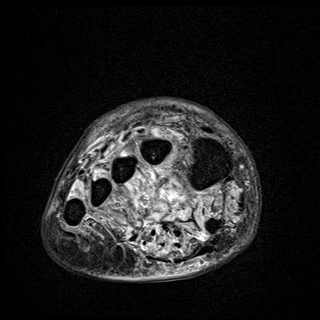
[im 33/45]
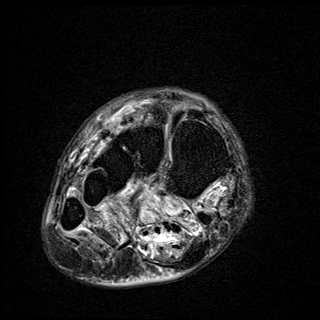
[im 37/45]
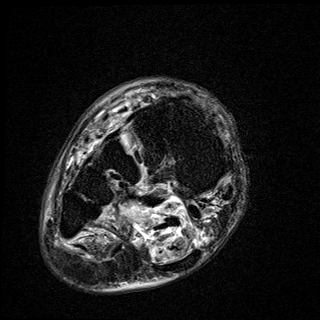
[im 41/45]
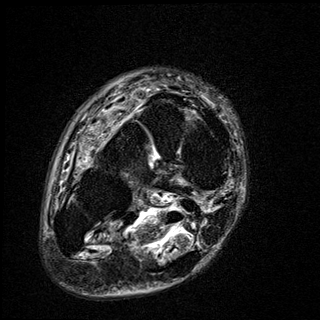
[im 45/45]
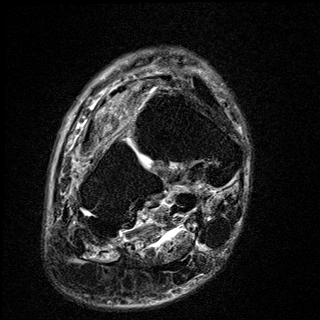

[Series 7: T1 · coronal · right · 3.0mm · 0.38mm/px · 11 of 45 slices shown (1 of 2)]
[im 1/45]
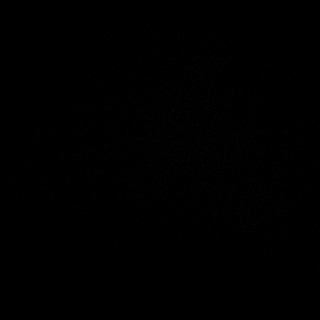
[im 5/45]
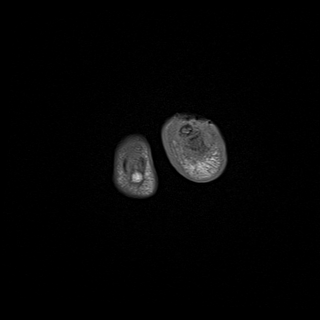
[im 9/45]
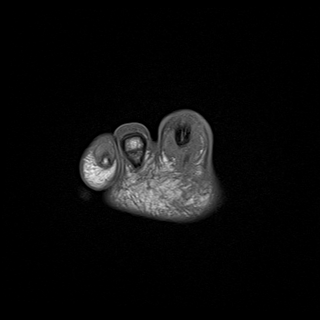
[im 14/45]
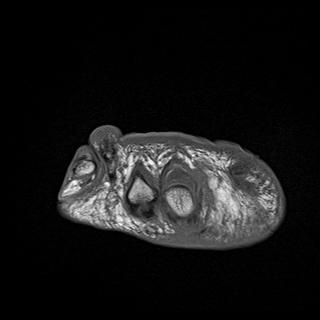
[im 18/45]
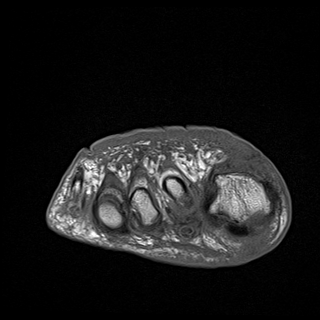
[im 23/45]
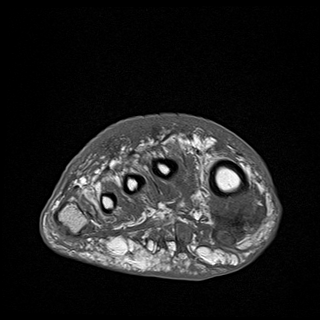
[im 27/45]
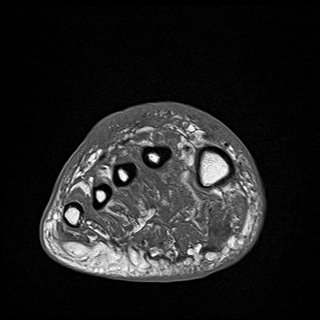
[im 31/45]
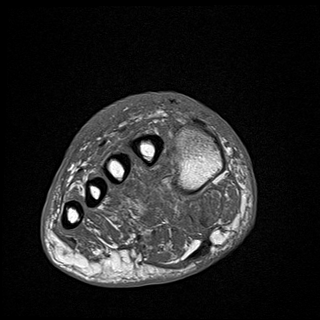
[im 36/45]
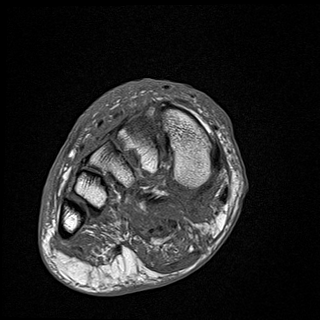
[im 40/45]
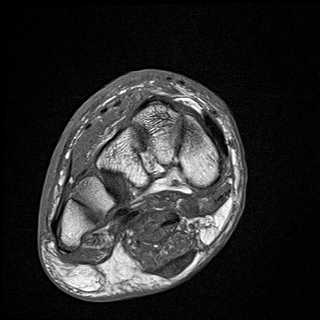
[im 45/45]
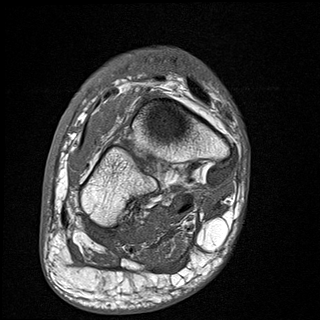

[Series 8: T1 · oblique · right · 3.0mm · 0.70mm/px · 5 of 21 slices shown (2 of 2)]
[im 1/21]
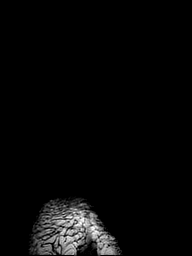
[im 6/21]
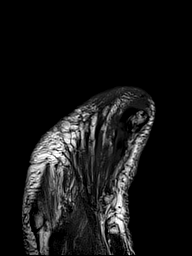
[im 11/21]
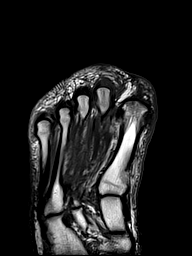
[im 16/21]
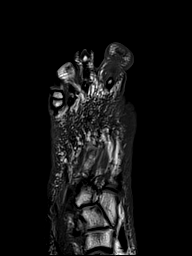
[im 21/21]
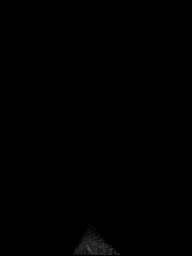

[Series 10: T2 · oblique · right · 3.0mm · 0.70mm/px · 5 of 21 slices shown (2 of 2)]
[im 1/21]
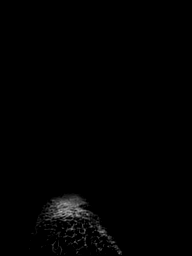
[im 6/21]
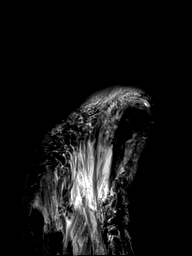
[im 11/21]
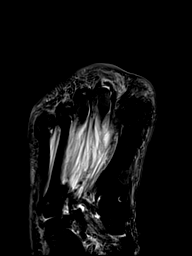
[im 16/21]
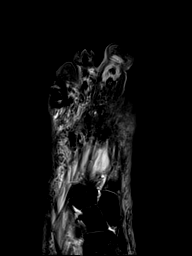
[im 21/21]
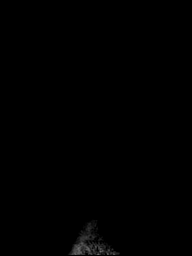

[Series 11: STIR · sagittal · right · 3.0mm · 0.62mm/px · 7 of 28 slices shown]
[im 1/28]
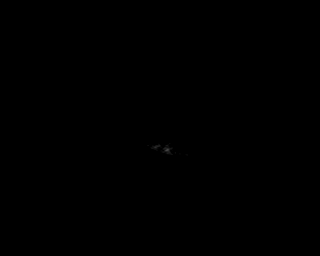
[im 5/28]
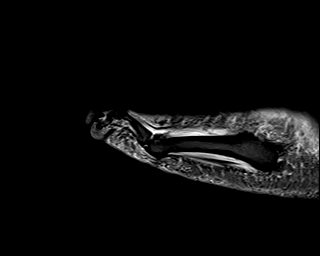
[im 10/28]
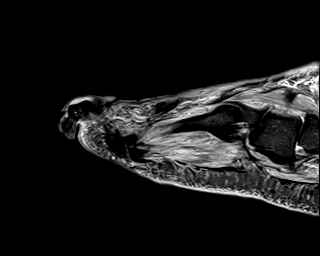
[im 14/28]
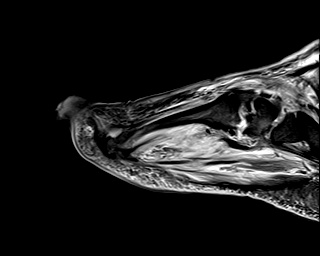
[im 19/28]
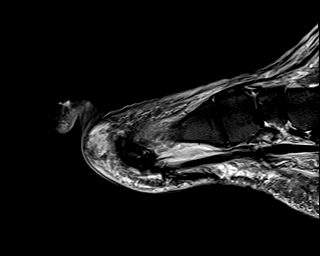
[im 23/28]
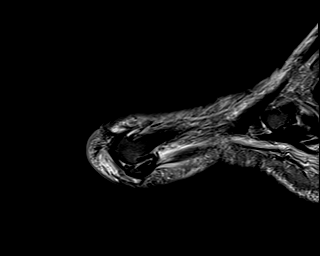
[im 28/28]
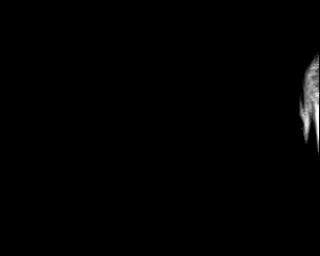

[40 of 40 positions shown; findings below may reference images not displayed]

FINDINGS: Bones/Joint/Cartilage

Prior great toe amputation. Abnormal marrow edema with decreased T1
marrow signal involving the second distal and middle phalanges
surrounding the DIP joint. No fracture or dislocation. Normal
alignment. Small second MTP joint effusion.

Ligaments

Collateral ligaments are intact.

Muscles and Tendons
Flexor, peroneal and extensor compartment tendons are intact. There
is fluid surrounding the distal second flexor tendons. Increased T2
signal within the intrinsic muscles of the forefoot, nonspecific,
but likely related to diabetic muscle changes.

Soft tissue
Dorsal soft tissue ulceration over the second DIP joint. There is an
ill-defined 2.4 x 1.0 x 2.5 cm fluid collection along the medial
aspect of the second proximal phalanx base. No soft tissue mass.
IMPRESSION: 1. Dorsal soft tissue ulceration over the second DIP joint with
underlying septic arthritis and adjacent osteomyelitis of the second
distal and middle phalanges.
2. Fluid surrounding the distal second flexor tendons, suspicious
for infectious tenosynovitis.
3. Ill-defined 2.5 cm fluid collection along the medial aspect of
the second proximal phalanx base, concerning for developing abscess.
